# Patient Record
Sex: Male | Born: 1955 | Race: Black or African American | Hispanic: No | Marital: Married | Smoking: Former smoker
Health system: Southern US, Community
[De-identification: ages and names within clinical notes are randomized; demographics above are authoritative.]

## PROBLEM LIST (undated history)

## (undated) DIAGNOSIS — E7800 Pure hypercholesterolemia, unspecified: Secondary | ICD-10-CM

## (undated) DIAGNOSIS — F419 Anxiety disorder, unspecified: Secondary | ICD-10-CM

## (undated) DIAGNOSIS — I2510 Atherosclerotic heart disease of native coronary artery without angina pectoris: Secondary | ICD-10-CM

## (undated) DIAGNOSIS — K649 Unspecified hemorrhoids: Secondary | ICD-10-CM

## (undated) DIAGNOSIS — R0600 Dyspnea, unspecified: Secondary | ICD-10-CM

## (undated) DIAGNOSIS — F32A Depression, unspecified: Secondary | ICD-10-CM

## (undated) DIAGNOSIS — N529 Male erectile dysfunction, unspecified: Secondary | ICD-10-CM

## (undated) DIAGNOSIS — J45909 Unspecified asthma, uncomplicated: Secondary | ICD-10-CM

## (undated) DIAGNOSIS — N401 Enlarged prostate with lower urinary tract symptoms: Secondary | ICD-10-CM

## (undated) DIAGNOSIS — F329 Major depressive disorder, single episode, unspecified: Secondary | ICD-10-CM

## (undated) DIAGNOSIS — R3915 Urgency of urination: Secondary | ICD-10-CM

## (undated) DIAGNOSIS — J449 Chronic obstructive pulmonary disease, unspecified: Secondary | ICD-10-CM

## (undated) DIAGNOSIS — I700 Atherosclerosis of aorta: Secondary | ICD-10-CM

## (undated) DIAGNOSIS — K219 Gastro-esophageal reflux disease without esophagitis: Secondary | ICD-10-CM

## (undated) DIAGNOSIS — G4730 Sleep apnea, unspecified: Secondary | ICD-10-CM

## (undated) DIAGNOSIS — R972 Elevated prostate specific antigen [PSA]: Secondary | ICD-10-CM

## (undated) DIAGNOSIS — R351 Nocturia: Secondary | ICD-10-CM

## (undated) DIAGNOSIS — I10 Essential (primary) hypertension: Secondary | ICD-10-CM

## (undated) HISTORY — DX: Anxiety disorder, unspecified: F41.9

## (undated) HISTORY — DX: Essential (primary) hypertension: I10

## (undated) HISTORY — DX: Gastro-esophageal reflux disease without esophagitis: K21.9

## (undated) HISTORY — DX: Unspecified hemorrhoids: K64.9

## (undated) HISTORY — DX: Depression, unspecified: F32.A

## (undated) HISTORY — DX: Sleep apnea, unspecified: G47.30

## (undated) HISTORY — DX: Chronic obstructive pulmonary disease, unspecified: J44.9

## (undated) HISTORY — DX: Major depressive disorder, single episode, unspecified: F32.9

## (undated) HISTORY — DX: Atherosclerosis of aorta: I70.0

## (undated) HISTORY — DX: Atherosclerotic heart disease of native coronary artery without angina pectoris: I25.10

## (undated) HISTORY — PX: PROSTATE BIOPSY: SHX241

---

## 2005-11-22 ENCOUNTER — Emergency Department (HOSPITAL_COMMUNITY): Admission: EM | Admit: 2005-11-22 | Discharge: 2005-11-22 | Payer: Self-pay | Admitting: Emergency Medicine

## 2005-12-01 ENCOUNTER — Encounter: Admission: RE | Admit: 2005-12-01 | Discharge: 2005-12-01 | Payer: Self-pay | Admitting: Gastroenterology

## 2006-03-11 ENCOUNTER — Emergency Department (HOSPITAL_COMMUNITY): Admission: EM | Admit: 2006-03-11 | Discharge: 2006-03-11 | Payer: Self-pay | Admitting: Emergency Medicine

## 2007-11-03 ENCOUNTER — Emergency Department (HOSPITAL_COMMUNITY): Admission: EM | Admit: 2007-11-03 | Discharge: 2007-11-03 | Payer: Self-pay | Admitting: Emergency Medicine

## 2008-10-11 ENCOUNTER — Emergency Department (HOSPITAL_COMMUNITY): Admission: EM | Admit: 2008-10-11 | Discharge: 2008-10-11 | Payer: Self-pay | Admitting: Emergency Medicine

## 2008-12-05 ENCOUNTER — Emergency Department (HOSPITAL_COMMUNITY): Admission: EM | Admit: 2008-12-05 | Discharge: 2008-12-05 | Payer: Self-pay | Admitting: Emergency Medicine

## 2009-03-04 ENCOUNTER — Emergency Department (HOSPITAL_COMMUNITY): Admission: EM | Admit: 2009-03-04 | Discharge: 2009-03-04 | Payer: Self-pay | Admitting: Emergency Medicine

## 2009-03-06 ENCOUNTER — Emergency Department (HOSPITAL_COMMUNITY): Admission: EM | Admit: 2009-03-06 | Discharge: 2009-03-06 | Payer: Self-pay | Admitting: Emergency Medicine

## 2009-06-10 ENCOUNTER — Emergency Department (HOSPITAL_COMMUNITY): Admission: EM | Admit: 2009-06-10 | Discharge: 2009-06-10 | Payer: Self-pay | Admitting: Emergency Medicine

## 2009-06-20 ENCOUNTER — Ambulatory Visit: Payer: Self-pay | Admitting: Internal Medicine

## 2009-06-20 ENCOUNTER — Encounter (INDEPENDENT_AMBULATORY_CARE_PROVIDER_SITE_OTHER): Payer: Self-pay | Admitting: Internal Medicine

## 2009-06-20 LAB — CONVERTED CEMR LAB
AST: 17 units/L (ref 0–37)
Albumin: 4.1 g/dL (ref 3.5–5.2)
BUN: 15 mg/dL (ref 6–23)
Basophils Absolute: 0 10*3/uL (ref 0.0–0.1)
Basophils Relative: 0 % (ref 0–1)
CO2: 23 meq/L (ref 19–32)
Calcium: 8.9 mg/dL (ref 8.4–10.5)
Chloride: 108 meq/L (ref 96–112)
Eosinophils Absolute: 0.1 10*3/uL (ref 0.0–0.7)
Glucose, Bld: 76 mg/dL (ref 70–99)
HCT: 42.1 % (ref 39.0–52.0)
MCV: 85.7 fL (ref 78.0–100.0)
Monocytes Absolute: 0.6 10*3/uL (ref 0.1–1.0)
Monocytes Relative: 11 % (ref 3–12)
Neutro Abs: 2.4 10*3/uL (ref 1.7–7.7)

## 2009-09-21 HISTORY — PX: COLONOSCOPY: SHX174

## 2010-12-29 LAB — WOUND CULTURE

## 2011-01-01 LAB — WOUND CULTURE

## 2011-06-12 LAB — URINALYSIS, ROUTINE W REFLEX MICROSCOPIC
Bilirubin Urine: NEGATIVE
Hgb urine dipstick: NEGATIVE
Nitrite: NEGATIVE
Specific Gravity, Urine: 1.02

## 2011-06-12 LAB — COMPREHENSIVE METABOLIC PANEL
AST: 21
Alkaline Phosphatase: 71
BUN: 12
Calcium: 9.6
Creatinine, Ser: 1.06
GFR calc non Af Amer: >60
Potassium: 4
Sodium: 134 — ABNORMAL LOW

## 2011-06-12 LAB — CBC
HCT: 45.8
Hemoglobin: 16.2
Platelets: 239
RBC: 5.22
WBC: 6.2

## 2011-06-12 LAB — DIFFERENTIAL
Basophils Absolute: 0.1
Eosinophils Relative: 3
Lymphocytes Relative: 36
Monocytes Absolute: 0.9
Neutrophils Relative %: 46

## 2011-07-26 ENCOUNTER — Emergency Department (HOSPITAL_COMMUNITY)
Admission: EM | Admit: 2011-07-26 | Discharge: 2011-07-26 | Disposition: A | Discharge status: Home / Self Care | Payer: Self-pay | Attending: Emergency Medicine | Admitting: Emergency Medicine

## 2011-07-26 ENCOUNTER — Encounter: Payer: Self-pay | Admitting: Adult Health

## 2011-07-26 DIAGNOSIS — K029 Dental caries, unspecified: Secondary | ICD-10-CM | POA: Insufficient documentation

## 2011-07-26 DIAGNOSIS — K047 Periapical abscess without sinus: Secondary | ICD-10-CM | POA: Insufficient documentation

## 2011-07-26 DIAGNOSIS — K089 Disorder of teeth and supporting structures, unspecified: Secondary | ICD-10-CM | POA: Insufficient documentation

## 2011-07-26 DIAGNOSIS — R220 Localized swelling, mass and lump, head: Secondary | ICD-10-CM | POA: Insufficient documentation

## 2011-07-26 MED ORDER — HYDROCODONE-ACETAMINOPHEN 5-325 MG PO TABS: 1.0000 | ORAL_TABLET | Freq: Four times a day (QID) | ORAL | Status: AC | PRN

## 2011-07-26 MED ORDER — PENICILLIN V POTASSIUM 500 MG PO TABS: 500.0000 mg | ORAL_TABLET | Freq: Four times a day (QID) | ORAL | Status: AC

## 2011-07-26 NOTE — ED Notes (Signed)
R upper tooth pain began yesterday. Right side of face withgedema. Airway intact.

## 2011-07-26 NOTE — ED Provider Notes (Signed)
History     CSN: [id number] Arrival date & time: 07/26/2011 11:52 AM   First MD Initiated Contact with Patient 07/26/11 1206      No chief complaint on file.   (Consider location/radiation/quality/duration/timing/severity/associated sxs/prior treatment) HPI Patient states that he developed a toothache and swelling of his face over the last 3 days.  Patient states that he does not see a dentist regularly.  She denies difficulty swallowing or difficulty breathing or swelling of the tongue.  Patient also denies fevers, as well. No past medical history on file.  No past surgical history on file.  No family history on file.  History  Substance Use Topics  . Smoking status: Not on file  . Smokeless tobacco: Not on file  . Alcohol Use: Not on file      Review of Systems  All other systems reviewed and are negative.    Allergies  Review of patient's allergies indicates no known allergies.  Home Medications   Current Outpatient Rx  Name Route Sig Dispense Refill  . IBUPROFEN 200 MG PO TABS Oral Take 400 mg by mouth every 8 (eight) hours as needed. For pain.       There were no vitals taken for this visit.  Physical Exam  Constitutional: He appears well-developed and well-nourished.  HENT:  Head: Normocephalic and atraumatic. No trismus in the jaw.  Mouth/Throat: Uvula is midline and oropharynx is clear and moist. He does not have dentures. No oral lesions. Dental abscesses and dental caries present. No uvula swelling.    Eyes: Pupils are equal, round, and reactive to light.  Neck: Normal range of motion. Neck supple.  Cardiovascular: Normal rate and regular rhythm.   Pulmonary/Chest: Effort normal and breath sounds normal.  Lymphadenopathy:    He has no cervical adenopathy.    ED Course  Procedures (including critical care time)  Labs Reviewed - No data to display No results found.   No diagnosis found.    MDM   The patient will be treated for an early  dental abscess.  He will be referred to the on-call dentist.  He is advised to use heat topically.  He told to return here as needed.       Jamesetta Orleans Gilman, [place] 07/26/11 1240

## 2011-07-26 NOTE — ED Provider Notes (Signed)
Medical screening examination/treatment/procedure(s) were performed by non-physician practitioner and as supervising physician I was immediately available for consultation/collaboration.  Doug Sou, MD 07/26/11 [phone]

## 2011-07-29 ENCOUNTER — Encounter (HOSPITAL_COMMUNITY): Payer: Self-pay | Admitting: Adult Health

## 2012-09-18 ENCOUNTER — Emergency Department (HOSPITAL_COMMUNITY): Payer: Self-pay

## 2012-09-18 ENCOUNTER — Observation Stay (HOSPITAL_COMMUNITY)
Admission: EM | Admit: 2012-09-18 | Discharge: 2012-09-19 | Disposition: A | Discharge status: Home / Self Care | Payer: Self-pay | Attending: Internal Medicine | Admitting: Internal Medicine

## 2012-09-18 ENCOUNTER — Encounter (HOSPITAL_COMMUNITY): Payer: Self-pay | Admitting: *Deleted

## 2012-09-18 DIAGNOSIS — F17200 Nicotine dependence, unspecified, uncomplicated: Secondary | ICD-10-CM | POA: Insufficient documentation

## 2012-09-18 DIAGNOSIS — K219 Gastro-esophageal reflux disease without esophagitis: Secondary | ICD-10-CM | POA: Insufficient documentation

## 2012-09-18 DIAGNOSIS — M25519 Pain in unspecified shoulder: Secondary | ICD-10-CM | POA: Insufficient documentation

## 2012-09-18 DIAGNOSIS — R0602 Shortness of breath: Secondary | ICD-10-CM | POA: Insufficient documentation

## 2012-09-18 DIAGNOSIS — Z79899 Other long term (current) drug therapy: Secondary | ICD-10-CM | POA: Insufficient documentation

## 2012-09-18 DIAGNOSIS — Z7982 Long term (current) use of aspirin: Secondary | ICD-10-CM | POA: Insufficient documentation

## 2012-09-18 DIAGNOSIS — M25511 Pain in right shoulder: Secondary | ICD-10-CM

## 2012-09-18 DIAGNOSIS — R079 Chest pain, unspecified: Principal | ICD-10-CM | POA: Diagnosis present

## 2012-09-18 HISTORY — DX: Unspecified asthma, uncomplicated: J45.909

## 2012-09-18 LAB — CBC
HCT: 42.2 % (ref 39.0–52.0)
Hemoglobin: 15.7 g/dL (ref 13.0–17.0)
MCV: 81.9 fL (ref 78.0–100.0)
RBC: 5.15 MIL/uL (ref 4.22–5.81)
RDW: 14 % (ref 11.5–15.5)
WBC: 5.8 10*3/uL (ref 4.0–10.5)

## 2012-09-18 LAB — RAPID URINE DRUG SCREEN, HOSP PERFORMED
Amphetamines: NOT DETECTED
Barbiturates: NOT DETECTED
Tetrahydrocannabinol: NOT DETECTED

## 2012-09-18 LAB — BASIC METABOLIC PANEL
BUN: 13 mg/dL (ref 6–23)
CO2: 24 mEq/L (ref 19–32)
Calcium: 8.9 mg/dL (ref 8.4–10.5)
Chloride: 102 mEq/L (ref 96–112)
GFR calc non Af Amer: >90 mL/min (ref >90)
Potassium: 3.7 mEq/L (ref 3.5–5.1)
Sodium: 136 mEq/L (ref 135–145)

## 2012-09-18 LAB — LIPID PANEL
Cholesterol: 154 mg/dL (ref 0–200)
HDL: 40 mg/dL (ref >39)
LDL Cholesterol: 99 mg/dL (ref 0–99)
Triglycerides: 77 mg/dL (ref <150)

## 2012-09-18 LAB — POCT I-STAT TROPONIN I

## 2012-09-18 LAB — TROPONIN I: Troponin I: <0.3 ng/mL (ref <0.30)

## 2012-09-18 LAB — PHOSPHORUS: Phosphorus: 2.6 mg/dL (ref 2.3–4.6)

## 2012-09-18 LAB — TSH: TSH: 1.208 u[IU]/mL (ref 0.350–4.500)

## 2012-09-18 LAB — MAGNESIUM: Magnesium: 2 mg/dL (ref 1.5–2.5)

## 2012-09-18 MED ORDER — ONDANSETRON HCL 4 MG/2ML IJ SOLN: 4.0000 mg | Freq: Four times a day (QID) | INTRAMUSCULAR | Status: DC | PRN

## 2012-09-18 MED ORDER — ONDANSETRON HCL 4 MG PO TABS: 4.0000 mg | ORAL_TABLET | Freq: Four times a day (QID) | ORAL | Status: DC | PRN

## 2012-09-18 MED ORDER — MORPHINE SULFATE 2 MG/ML IJ SOLN
2.0000 mg | INTRAMUSCULAR | Status: DC | PRN
  Administered 2012-09-18 (×2): 2 mg via INTRAVENOUS
  Filled 2012-09-18 (×2): qty 1

## 2012-09-18 MED ORDER — HYDROCODONE-ACETAMINOPHEN 5-325 MG PO TABS
1.0000 | ORAL_TABLET | ORAL | Status: DC | PRN
  Administered 2012-09-18 – 2012-09-19 (×2): 2 via ORAL
  Administered 2012-09-19: 1 via ORAL
  Filled 2012-09-18 (×4): qty 2

## 2012-09-18 MED ORDER — ENOXAPARIN SODIUM 40 MG/0.4ML ~~LOC~~ SOLN
40.0000 mg | SUBCUTANEOUS | Status: DC
  Administered 2012-09-18: 40 mg via SUBCUTANEOUS
  Filled 2012-09-18 (×3): qty 0.4

## 2012-09-18 MED ORDER — ASPIRIN EC 81 MG PO TBEC
81.0000 mg | DELAYED_RELEASE_TABLET | Freq: Every day | ORAL | Status: DC
  Administered 2012-09-19: 81 mg via ORAL
  Filled 2012-09-18 (×2): qty 1

## 2012-09-18 MED ORDER — NITROGLYCERIN 0.4 MG SL SUBL
0.4000 mg | SUBLINGUAL_TABLET | SUBLINGUAL | Status: DC | PRN
  Administered 2012-09-18: 0.4 mg via SUBLINGUAL
  Filled 2012-09-18: qty 25

## 2012-09-18 MED ORDER — ASPIRIN 325 MG PO TABS
325.0000 mg | ORAL_TABLET | ORAL | Status: AC
  Administered 2012-09-18: 325 mg via ORAL
  Filled 2012-09-18: qty 1

## 2012-09-18 MED ORDER — MORPHINE SULFATE 4 MG/ML IJ SOLN
4.0000 mg | Freq: Once | INTRAMUSCULAR | Status: AC
  Administered 2012-09-18: 4 mg via INTRAVENOUS
  Filled 2012-09-18: qty 1

## 2012-09-18 MED ORDER — ZOLPIDEM TARTRATE 5 MG PO TABS
5.0000 mg | ORAL_TABLET | Freq: Every evening | ORAL | Status: DC | PRN
  Administered 2012-09-18: 5 mg via ORAL
  Filled 2012-09-18: qty 1

## 2012-09-18 MED ORDER — NICOTINE 14 MG/24HR TD PT24
14.0000 mg | MEDICATED_PATCH | Freq: Every day | TRANSDERMAL | Status: DC
  Administered 2012-09-18 – 2012-09-19 (×2): 14 mg via TRANSDERMAL
  Filled 2012-09-18 (×3): qty 1

## 2012-09-18 MED ORDER — SODIUM CHLORIDE 0.9 % IJ SOLN
3.0000 mL | Freq: Two times a day (BID) | INTRAMUSCULAR | Status: DC
  Administered 2012-09-18: 3 mL via INTRAVENOUS

## 2012-09-18 NOTE — ED Provider Notes (Signed)
History     CSN: [id number]  Arrival date & time 09/18/12  4098   First MD Initiated Contact with Patient 09/18/12 [id number]      Chief Complaint  Patient presents with  . Chest Pain    (Consider location/radiation/quality/duration/timing/severity/associated sxs/prior treatment) HPI Pt reports 3 weeks of intermittent chest pains, starts on R chest and radiates to L chest, associated with R arm tingling and 'heavy breathing'. He had a particular severe episode this morning, pain is described as pressure. No particular provoking or relieving factors. No prior evaluated of same. No known CAD, HTN or DM. He is a smoker and has a strong family history of CAD.   Past Medical History  Diagnosis Date  . Asthma     History reviewed. No pertinent past surgical history.  History reviewed. No pertinent family history.  History  Substance Use Topics  . Smoking status: Current Every Day Smoker -- 1.0 packs/day for 15 years    Types: Cigarettes  . Smokeless tobacco: Not on file  . Alcohol Use: No      Review of Systems All other systems reviewed and are negative except as noted in HPI.   Allergies  Review of patient's allergies indicates no known allergies.  Home Medications   Current Outpatient Rx  Name  Route  Sig  Dispense  Refill  . IBUPROFEN 200 MG PO TABS   Oral   Take 400 mg by mouth every 8 (eight) hours as needed. For pain.            BP 138/78  Pulse 83  Temp 98.5 F (36.9 C) (Oral)  Resp 16  SpO2 99%  Physical Exam  Nursing note and vitals reviewed. Constitutional: He is oriented to person, place, and time. He appears well-developed and well-nourished.  HENT:  Head: Normocephalic and atraumatic.  Eyes: EOM are normal. Pupils are equal, round, and reactive to light.  Neck: Normal range of motion. Neck supple.  Cardiovascular: Normal rate, normal heart sounds and intact distal pulses.   Pulmonary/Chest: Effort normal and breath sounds normal. He exhibits  tenderness.  Abdominal: Bowel sounds are normal. He exhibits no distension. There is no tenderness.  Musculoskeletal: Normal range of motion. He exhibits no edema and no tenderness.  Neurological: He is alert and oriented to person, place, and time. He has normal strength. No cranial nerve deficit or sensory deficit.  Skin: Skin is warm and dry. No rash noted.  Psychiatric: He has a normal mood and affect.    ED Course  Procedures (including critical care time)  Labs Reviewed  CBC - Abnormal; Notable for the following:    MCHC 37.2 (*)  RULED OUT INTERFERING SUBSTANCES   All other components within normal limits  BASIC METABOLIC PANEL  POCT I-STAT TROPONIN I   Dg Chest Port 1 View  09/18/2012  *RADIOLOGY REPORT*  Clinical Data: Chest pain  PORTABLE CHEST - 1 VIEW  Comparison: None  Findings: Normal heart size.  There is no pleural effusion or edema.  No airspace consolidation identified.  Review of the visualized osseous structures is unremarkable.  IMPRESSION:  1.  No acute cardiopulmonary abnormalities.   Original Report Authenticated By: Signa Kell, M.D.      No diagnosis found.    MDM   Date: 09/18/2012  Rate: 83  Rhythm: normal sinus rhythm  QRS Axis: left  Intervals: normal  ST/T Wave abnormalities: normal  Conduction Disutrbances:none  Narrative Interpretation:   Old EKG Reviewed: none available  10:29 AM Pt still having some pain, Morphine ordered. Discussed with Hospitalist who will admit for rule out.         Charles B. Bernette Mayers, MD 09/18/12 1029

## 2012-09-18 NOTE — ED Notes (Signed)
Attempted to call report to [facility], however RN said he is not comfortable getting this pt since they are not cardiac telemetry. Tim RN stated he will check if this pt can be transferred to cardiac telemetry floor. Awaiting for call back from admitting MD.

## 2012-09-18 NOTE — H&P (Signed)
[facility] History and Physical  Joshua Stephenson WUJ:[id number] DOB: [birth year] DOA: 09/18/2012  Referring physician: ED physician PCP: No primary provider on file.   Chief Complaint: Chest pain  HPI:  Pt is 56 yo male with no specific past medical history, presents to [facility] ED with main concern of sudden onset, sharp and pressure like right sidedchest pain, started one day prior to admission and associated with shortness of breath. Pt describes pain as intermittent in nature, occasionally but not consistently radiating to the left side, no specific alleviating or aggravating factors. No similar events in the past. Pt denies fevers, chills, abdominal or urinary concerns, no specific focal neurological weakness, no headaches, no visual changes. Pt also denies any cough, palpitations, no orthopnea.  Assessment and Plan:  Principal Problem:  *Chest pain - unclear etiology at this time - will admit to telemetry unit for observation and ACS rule out - provide supportive care with analgesia and oxygen as needed - continue NTG as needed as well - obtain 12 lead EKG, cycle CE's, check TSH, UDS, lipid panel  Code Status: Full Family Communication: Pt at bedside Disposition Plan: Admit to telemetry floor for observation  Review of Systems:  Constitutional: Negative for fever, chills and malaise/fatigue. Negative for diaphoresis.  HENT: Negative for hearing loss, ear pain, nosebleeds, congestion, sore throat, neck pain, tinnitus and ear discharge.   Eyes: Negative for blurred vision, double vision, photophobia, pain, discharge and redness.  Respiratory: Negative for cough, hemoptysis, sputum production, positive for shortness of breath, negative for wheezing and stridor.   Cardiovascular: Positive for chest pain, negative for palpitations, orthopnea, claudication and leg swelling.  Gastrointestinal: Negative for nausea, vomiting and abdominal pain. Negative for heartburn, constipation,  blood in stool and melena.  Genitourinary: Negative for dysuria, urgency, frequency, hematuria and flank pain.  Musculoskeletal: Negative for myalgias, back pain, joint pain and falls.  Skin: Negative for itching and rash.  Neurological: Negative for dizziness and weakness. Negative for tingling, tremors, sensory change, speech change, focal weakness, loss of consciousness and headaches.  Endo/Heme/Allergies: Negative for environmental allergies and polydipsia. Does not bruise/bleed easily.  Psychiatric/Behavioral: Negative for suicidal ideas. The patient is not nervous/anxious.      Past Medical History  Diagnosis Date  . Asthma     History reviewed. No pertinent past surgical history.  Social History:  reports that he has been smoking Cigarettes.  He has a 15 pack-year smoking history. He does not have any smokeless tobacco history on file. He reports that he does not drink alcohol or use illicit drugs.  No Known Allergies  No family history of heart disease.   Prior to Admission medications   Not on File    Physical Exam: Filed Vitals:   09/18/12 0832 09/18/12 0839 09/18/12 0958  BP: 138/78  118/72  Pulse:  83 71  Temp: 98.5 F (36.9 C)    TempSrc: Oral    Resp: 22 16 13   SpO2: 99% 99% 99%    Physical Exam  Constitutional: Appears well-developed and well-nourished. No distress.  HENT: Normocephalic. External right and left ear normal. Oropharynx is clear and moist.  Eyes: Conjunctivae and EOM are normal. PERRLA, no scleral icterus.  Neck: Normal ROM. Neck supple. No JVD. No tracheal deviation. No thyromegaly.  CVS: RRR, S1/S2 +, no murmurs, no gallops, no carotid bruit.  Pulmonary: Effort and breath sounds normal, no stridor, rhonchi, wheezes, rales.  Abdominal: Soft. BS +,  no distension, tenderness, rebound or guarding.  Musculoskeletal: Normal  range of motion. No edema and no tenderness.  Lymphadenopathy: No lymphadenopathy noted, cervical, inguinal. Neuro:  Alert. Normal reflexes, muscle tone coordination. No cranial nerve deficit. Skin: Skin is warm and dry. No rash noted. Not diaphoretic. No erythema. No pallor.  Psychiatric: Normal mood and affect. Behavior, judgment, thought content normal.   Labs on Admission:  Basic Metabolic Panel:  Lab 09/18/12 1610  NA 136  K 3.7  CL 102  CO2 24  GLUCOSE 99  BUN 13  CREATININE 0.99  CALCIUM 8.9  MG --  PHOS --   CBC:  Lab 09/18/12 0857  WBC 5.8  NEUTROABS --  HGB 15.7  HCT 42.2  MCV 81.9  PLT 219   Radiological Exams on Admission: Dg Chest Port 1 View  09/18/2012  *RADIOLOGY REPORT*  Clinical Data: Chest pain  PORTABLE CHEST - 1 VIEW  Comparison: None  Findings: Normal heart size.  There is no pleural effusion or edema.  No airspace consolidation identified.  Review of the visualized osseous structures is unremarkable.  IMPRESSION:  1.  No acute cardiopulmonary abnormalities.   Original Report Authenticated By: Signa Kell, M.D.    EKG: Normal sinus rhythm, no ST/T wave changes  Debbora Presto, MD  [facility] Pager [phone]  If 7PM-7AM, please contact night-coverage [email or web address] Password [facility] 09/18/2012, 10:26 AM

## 2012-09-18 NOTE — ED Notes (Signed)
Attempted to call report, nurse is unavailable at this time

## 2012-09-18 NOTE — ED Notes (Signed)
Pt alert and oriented x4. Respirations even and unlabored, bilateral symmetrical rise and fall of chest. Skin warm and dry. In no acute distress. Denies needs.   

## 2012-09-18 NOTE — Progress Notes (Signed)
RN reviewed PCR screen which is resulted as Negative. Will D/C Contact Precautions. Pt and wife educated about Negative results. No questions voiced.

## 2012-09-18 NOTE — ED Notes (Signed)
Pt states he has been having rt sided chest pain on and off for about 3 weeks, rt arm numbness and tingling, pain shoots across chest and leaves, this morning it lasted about 10 min. No other associated symptoms

## 2012-09-18 NOTE — ED Notes (Signed)
Called to pt's room by wife, pt sts chest pain is back and much stronger after he got up to walk to the bathroom. Nitroglycerin given as ordered, will continue to monitor

## 2012-09-19 DIAGNOSIS — M25519 Pain in unspecified shoulder: Secondary | ICD-10-CM

## 2012-09-19 DIAGNOSIS — K219 Gastro-esophageal reflux disease without esophagitis: Secondary | ICD-10-CM

## 2012-09-19 LAB — CBC
HCT: 40.4 % (ref 39.0–52.0)
Hemoglobin: 14.8 g/dL (ref 13.0–17.0)
MCHC: 36.6 g/dL — ABNORMAL HIGH (ref 30.0–36.0)
MCV: 82.8 fL (ref 78.0–100.0)

## 2012-09-19 LAB — BASIC METABOLIC PANEL
BUN: 16 mg/dL (ref 6–23)
CO2: 27 mEq/L (ref 19–32)
Chloride: 103 mEq/L (ref 96–112)
GFR calc Af Amer: 83 mL/min — ABNORMAL LOW (ref >90)
Glucose, Bld: 95 mg/dL (ref 70–99)
Potassium: 3.9 mEq/L (ref 3.5–5.1)

## 2012-09-19 MED ORDER — GABAPENTIN 100 MG PO CAPS: 100.0000 mg | ORAL_CAPSULE | Freq: Three times a day (TID) | ORAL | Status: DC

## 2012-09-19 MED ORDER — ASPIRIN 81 MG PO TBEC: 81.0000 mg | DELAYED_RELEASE_TABLET | Freq: Every day | ORAL | Status: DC

## 2012-09-19 MED ORDER — HYDROCODONE-ACETAMINOPHEN 5-325 MG PO TABS: 1.0000 | ORAL_TABLET | ORAL | Status: DC | PRN

## 2012-09-19 MED ORDER — OMEPRAZOLE 40 MG PO CPDR: 40.0000 mg | DELAYED_RELEASE_CAPSULE | Freq: Every day | ORAL | Status: DC

## 2012-09-19 MED ORDER — NICOTINE 14 MG/24HR TD PT24: 1.0000 | MEDICATED_PATCH | Freq: Every day | TRANSDERMAL | Status: DC

## 2012-09-19 NOTE — Discharge Summary (Signed)
Physician Discharge Summary  Joshua Stephenson ZOX:[id number] DOB: Jul 09, 1956 DOA: 09/18/2012  PCP: No primary provider on file.  Admit date: 09/18/2012 Discharge date: 09/19/2012  Time spent: >30 minutes  Recommendations for Outpatient Follow-up:  1-Take medication as prescribed 2-Arrange follow up with orthopedic service for further evaluation and treatment of right shoulder discomfort 3-[facility] will arrange outpatient visit for stress test (Given, Gender, age, family hx and smoking status) 4-Stop smoking.  Discharge Diagnoses:  *Chest pain GERD Right shoulder pain  Discharge Condition: stable and improved. Patient will follow with [facility] cardiology for outpatient stress test; he will also arrange as his earliest convenience follow up with an orthopedic doctor for evaluation on his right shoulder problem (appears to be rotator cuff syndrome). Advised to quit smoking and to take medications as prescribed.  Diet recommendation: heart healthy diet  Filed Weights   09/18/12 1355  Weight: 81.693 kg (180 lb 1.6 oz)    History of present illness:  56 yo male with no specific past medical history, presents to [facility] ED with main concern of sudden onset, sharp and pressure like right sidedchest pain, started one day prior to admission and associated with shortness of breath. Pt describes pain as intermittent in nature, occasionally but not consistently radiating to the left side, no specific alleviating or aggravating factors. No similar events in the past. Pt denies fevers, chills, abdominal or urinary concerns, no specific focal neurological weakness, no headaches, no visual changes. Pt also denies any cough, palpitations, no orthopnea.   Hospital Course:  1-Chest pain: atypical and most likely secondary to GERD. CE's negative and no further discomfort throughout the night. Patient with strong family hx of CAD and smoking status. Will have stress test as an outpatient to be  thorough. Lipid profile WNL. Continue baby ASA daily. Will start PPI and provide prescription for PRN pain meds.  2-GERD: advised to stop smoking. Started on prilosec 40mg  daily.  3-Right shoulder pain: PE consistently with rotator cuff syndrome; patient advised to arrange follow up with an orthopedic specialist for further evaluation and treatment. Started on neurontin to help controlling symptoms. Will also received Pain meds to use as needed.  4-Tobacco abuse: counseling provided. started on nicotine patch.  Procedures: See below for x-ray reports.  Consultations:  none  Discharge Exam: Filed Vitals:   09/18/12 1326 09/18/12 1355 09/18/12 2130 09/19/12 0545  BP: 131/73 128/87 121/80 122/67  Pulse: 63 64 62 66  Temp: 98.3 F (36.8 C) 98.2 F (36.8 C) 98.1 F (36.7 C) 98.1 F (36.7 C)  TempSrc:  Oral Oral Oral  Resp: 18 20 20 20   Height:  5\' 7"  (1.702 m)    Weight:  81.693 kg (180 lb 1.6 oz)    SpO2: 100% 100% 98% 98%    General: NAD, no SOB, afebrile Cardiovascular: RRR, no rubs or gallops Respiratory: CTA bilaterally Extremities: no edema, no cyanosis; right upper extremity with decrease range of motion and pain with shoulder abduction and rotation. Patient also reported some intermittent numbness and tingling sensation on his right arm. Neuro: non focal.  Discharge Instructions  Discharge Orders    Future Orders Please Complete By Expires   Diet - low sodium heart healthy      Discharge instructions      Comments:   -Stop smoking -Take medications as prescribed -Arrange follow up with orthopedic service for further evaluation and treatment of your right shoulder pain. -[facility] will call you to arrange outpatient visit for stress test.  Medication List     As of 09/19/2012 11:39 AM    TAKE these medications         aspirin 81 MG EC tablet   Take 1 tablet (81 mg total) by mouth daily.      gabapentin 100 MG capsule   Commonly known as:  NEURONTIN   Take 1 capsule (100 mg total) by mouth 3 (three) times daily.      HYDROcodone-acetaminophen 5-325 MG per tablet   Commonly known as: NORCO/VICODIN   Take 1-2 tablets by mouth every 4 (four) hours as needed.      nicotine 14 mg/24hr patch   Commonly known as: NICODERM CQ - dosed in mg/24 hours   Place 1 patch onto the skin daily.      omeprazole 40 MG capsule   Commonly known as: PRILOSEC   Take 1 capsule (40 mg total) by mouth daily.           Follow-up Information    Follow up with [facility]). (Office will call you with appointment details.)    Contact information:   [address], [address] [place] [address] [phone]          The results of significant diagnostics from this hospitalization (including imaging, microbiology, ancillary and laboratory) are listed below for reference.    Significant Diagnostic Studies: Dg Chest Port 1 View  09/18/2012  *RADIOLOGY REPORT*  Clinical Data: Chest pain  PORTABLE CHEST - 1 VIEW  Comparison: None  Findings: Normal heart size.  There is no pleural effusion or edema.  No airspace consolidation identified.  Review of the visualized osseous structures is unremarkable.  IMPRESSION:  1.  No acute cardiopulmonary abnormalities.   Original Report Authenticated By: Signa Kell, M.D.     Microbiology: Recent Results (from the past 240 hour(s))  MRSA PCR SCREENING     Status: Normal   Collection Time   09/18/12  4:03 PM      Component Value Range Status Comment   MRSA by PCR NEGATIVE  NEGATIVE Final      Labs: Basic Metabolic Panel:  Lab 09/19/12 9147 09/18/12 1418 09/18/12 0857  NA 138 -- 136  K 3.9 -- 3.7  CL 103 -- 102  CO2 27 -- 24  GLUCOSE 95 -- 99  BUN 16 -- 13  CREATININE 1.13 -- 0.99  CALCIUM 8.7 -- 8.9  MG -- 2.0 --  PHOS -- 2.6 --   CBC:  Lab 09/19/12 0130 09/18/12 0857  WBC 6.1 5.8  NEUTROABS -- --  HGB 14.8 15.7  HCT 40.4 42.2  MCV 82.8  81.9  PLT 234 219   Cardiac Enzymes:  Lab 09/19/12 0130 09/18/12 1948 09/18/12 1418  CKTOTAL -- -- --  CKMB -- -- --  CKMBINDEX -- -- --  TROPONINI <0.30 <0.30 <0.30    Signed:  Cebastian Neis  [facility] 09/19/2012, 11:39 AM

## 2012-09-19 NOTE — Care Management Note (Signed)
    Page 1 of 1   09/19/2012     11:53:30 AM   CARE MANAGEMENT NOTE 09/19/2012  Patient:  Joshua Stephenson, Joshua Stephenson   Account Number:  [id number]  Date Initiated:  09/19/2012  Documentation initiated by:  Lanier Clam  Subjective/Objective Assessment:   ADMITTED W/CHEST PAIN.     Action/Plan:   FROM HOME   Anticipated DC Date:  09/19/2012   Anticipated DC Plan:  HOME/SELF CARE      DC Planning Services  CM consult      Choice offered to / List presented to:             Status of service:  Completed, signed off Medicare Important Message given?   (If response is "NO", the following Medicare IM given date fields will be blank) Date Medicare IM given:   Date Additional Medicare IM given:    Discharge Disposition:  HOME/SELF CARE  Per UR Regulation:  Reviewed for med. necessity/level of care/duration of stay  If discussed at Long Length of Stay Meetings, dates discussed:    Comments:  09/19/12 Christean Silvestri RN,BSN NCM [phone] NO D/C NEEDS.

## 2014-05-27 ENCOUNTER — Emergency Department (HOSPITAL_COMMUNITY): Payer: PRIVATE HEALTH INSURANCE

## 2014-05-27 ENCOUNTER — Emergency Department (HOSPITAL_COMMUNITY)
Admission: EM | Admit: 2014-05-27 | Discharge: 2014-05-27 | Disposition: A | Discharge status: Home / Self Care | Payer: PRIVATE HEALTH INSURANCE | Attending: Emergency Medicine | Admitting: Emergency Medicine

## 2014-05-27 ENCOUNTER — Encounter (HOSPITAL_COMMUNITY): Payer: Self-pay | Admitting: Emergency Medicine

## 2014-05-27 ENCOUNTER — Emergency Department (HOSPITAL_COMMUNITY)
Admission: EM | Admit: 2014-05-27 | Discharge: 2014-05-27 | Discharge status: Against Medical Advice | Payer: PRIVATE HEALTH INSURANCE | Attending: Emergency Medicine | Admitting: Emergency Medicine

## 2014-05-27 DIAGNOSIS — R072 Precordial pain: Secondary | ICD-10-CM | POA: Diagnosis present

## 2014-05-27 DIAGNOSIS — R209 Unspecified disturbances of skin sensation: Secondary | ICD-10-CM | POA: Insufficient documentation

## 2014-05-27 DIAGNOSIS — J45901 Unspecified asthma with (acute) exacerbation: Secondary | ICD-10-CM | POA: Insufficient documentation

## 2014-05-27 DIAGNOSIS — F17200 Nicotine dependence, unspecified, uncomplicated: Secondary | ICD-10-CM | POA: Diagnosis not present

## 2014-05-27 DIAGNOSIS — R079 Chest pain, unspecified: Secondary | ICD-10-CM | POA: Insufficient documentation

## 2014-05-27 DIAGNOSIS — R0789 Other chest pain: Secondary | ICD-10-CM | POA: Diagnosis not present

## 2014-05-27 DIAGNOSIS — F411 Generalized anxiety disorder: Secondary | ICD-10-CM | POA: Diagnosis not present

## 2014-05-27 DIAGNOSIS — F419 Anxiety disorder, unspecified: Secondary | ICD-10-CM

## 2014-05-27 DIAGNOSIS — K649 Unspecified hemorrhoids: Secondary | ICD-10-CM | POA: Diagnosis not present

## 2014-05-27 DIAGNOSIS — J45909 Unspecified asthma, uncomplicated: Secondary | ICD-10-CM | POA: Insufficient documentation

## 2014-05-27 LAB — CBC
HEMATOCRIT: 40.3 % (ref 39.0–52.0)
HEMATOCRIT: 41 % (ref 39.0–52.0)
HEMOGLOBIN: 15.2 g/dL (ref 13.0–17.0)
Hemoglobin: 15.3 g/dL (ref 13.0–17.0)
MCH: 31.8 pg (ref 26.0–34.0)
MCH: 31.7 pg (ref 26.0–34.0)
MCHC: 37.7 g/dL — AB (ref 30.0–36.0)
MCHC: 37.3 g/dL — ABNORMAL HIGH (ref 30.0–36.0)
MCV: 84.3 fL (ref 78.0–100.0)
MCV: 85.1 fL (ref 78.0–100.0)
Platelets: 250 10*3/uL (ref 150–400)
Platelets: 243 10*3/uL (ref 150–400)
RBC: 4.78 MIL/uL (ref 4.22–5.81)
RBC: 4.82 MIL/uL (ref 4.22–5.81)
RDW: 14.4 % (ref 11.5–15.5)
RDW: 14.7 % (ref 11.5–15.5)
WBC: 8.3 10*3/uL (ref 4.0–10.5)
WBC: 6.5 10*3/uL (ref 4.0–10.5)

## 2014-05-27 LAB — BASIC METABOLIC PANEL
Anion gap: 16 — ABNORMAL HIGH (ref 5–15)
Anion gap: 16 — ABNORMAL HIGH (ref 5–15)
BUN: 11 mg/dL (ref 6–23)
BUN: 11 mg/dL (ref 6–23)
CHLORIDE: 107 meq/L (ref 96–112)
CO2: 21 mEq/L (ref 19–32)
CO2: 22 meq/L (ref 19–32)
CREATININE: 1.01 mg/dL (ref 0.50–1.35)
CREATININE: 1.03 mg/dL (ref 0.50–1.35)
Calcium: 9.3 mg/dL (ref 8.4–10.5)
Calcium: 9.1 mg/dL (ref 8.4–10.5)
Chloride: 108 mEq/L (ref 96–112)
GFR calc Af Amer: >90 mL/min (ref >90)
GFR calc non Af Amer: 80 mL/min — ABNORMAL LOW (ref >90)
GFR, EST NON AFRICAN AMERICAN: 82 mL/min — AB (ref >90)
GLUCOSE: 103 mg/dL — AB (ref 70–99)
Glucose, Bld: 132 mg/dL — ABNORMAL HIGH (ref 70–99)
POTASSIUM: 3.7 meq/L (ref 3.7–5.3)
Potassium: 3.7 mEq/L (ref 3.7–5.3)
Sodium: 145 mEq/L (ref 137–147)
Sodium: 145 mEq/L (ref 137–147)

## 2014-05-27 LAB — I-STAT TROPONIN, ED: Troponin i, poc: 0 ng/mL (ref 0.00–0.08)

## 2014-05-27 LAB — TROPONIN I: Troponin I: <0.3 ng/mL (ref <0.30)

## 2014-05-27 LAB — POC OCCULT BLOOD, ED: FECAL OCCULT BLD: NEGATIVE

## 2014-05-27 LAB — PRO B NATRIURETIC PEPTIDE: Pro B Natriuretic peptide (BNP): 22.2 pg/mL (ref 0–125)

## 2014-05-27 MED ORDER — HYDROCORTISONE 2.5 % RE CREA: TOPICAL_CREAM | RECTAL | Status: DC

## 2014-05-27 MED ORDER — OXYCODONE-ACETAMINOPHEN 5-325 MG PO TABS
1.0000 | ORAL_TABLET | Freq: Once | ORAL | Status: AC
  Administered 2014-05-27: 1 via ORAL
  Filled 2014-05-27: qty 1

## 2014-05-27 MED ORDER — BISACODYL 5 MG PO TBEC: 5.0000 mg | DELAYED_RELEASE_TABLET | Freq: Two times a day (BID) | ORAL | Status: DC

## 2014-05-27 MED ORDER — OXYCODONE-ACETAMINOPHEN 5-325 MG PO TABS: 1.0000 | ORAL_TABLET | Freq: Four times a day (QID) | ORAL | Status: DC | PRN

## 2014-05-27 NOTE — ED Provider Notes (Signed)
CSN: [id number]     Arrival date & time 05/27/14  0116 History   First MD Initiated Contact with Patient 05/27/14 0144     Chief Complaint  Patient presents with  . Chest Pain     (Consider location/radiation/quality/duration/timing/severity/associated sxs/prior Treatment) HPI Comments: Patient reports, that he has recurrent episodic pain.  That starts in his left upper quadrant, radiates to his left chest wall, and then across to his right chest wall.  This is been going on for quite a while getting worse.  Over the past 2, days.  Tonight.  He became frightened because when he had the pain in his left chest.  He also had some numbness and tingling in his left arm. The pain is relieved by putting pressure on his chest wall. He voices concern that he may have cardiac disease, versus diabetes versus cancer procedures.  All run in his family. In 2013.  She had an admission to the hospital to rule out cardiac disease.  Has not followed up with a physician.  Since that time.  Patient is a 58 y.o. male presenting with chest pain. The history is provided by the patient.  Chest Pain Pain location:  L chest and R lateral chest Pain quality: stabbing   Pain radiates to the back: no   Pain severity:  Moderate Onset quality:  Gradual Duration:  2 days Timing:  Sporadic Progression:  Unchanged Chronicity:  Recurrent Context: at rest and stress   Context: not breathing, no drug use, no intercourse, not lifting, no movement, not raising an arm and no trauma   Relieved by:  Certain positions (pressure on chest wall) Worsened by:  Nothing tried Ineffective treatments:  None tried Associated symptoms: anxiety and numbness   Associated symptoms: no cough, no diaphoresis, no dizziness, no nausea and no weakness     Past Medical History  Diagnosis Date  . Asthma    History reviewed. No pertinent past surgical history. Family History  Problem Relation Age of Onset  . CAD Father   . CAD Sister    . CAD Brother   . Cancer Other   . Hypertension Other   . Diabetes Other    History  Substance Use Topics  . Smoking status: Current Every Day Smoker -- 1.00 packs/day for 20 years    Types: Cigarettes  . Smokeless tobacco: Current User  . Alcohol Use: Yes    Review of Systems  Constitutional: Negative for diaphoresis.  Respiratory: Negative for cough.   Cardiovascular: Positive for chest pain. Negative for leg swelling.  Gastrointestinal: Negative for nausea.  Skin: Negative for rash and wound.  Neurological: Positive for numbness. Negative for dizziness and weakness.  All other systems reviewed and are negative.     Allergies  Review of patient's allergies indicates no known allergies.  Home Medications   Prior to Admission medications   Medication Sig Start Date End Date Taking? Authorizing Provider  Doxylamine-DM (VICKS NYQUIL COUGH) 6.25-15 MG/15ML LIQD Take 30 mLs by mouth at bedtime as needed (for sleep).   Yes Historical Provider, MD   BP 124/86  Pulse 93  Temp(Src) 98.9 F (37.2 C) (Oral)  Resp 18  SpO2 97% Physical Exam  Nursing note and vitals reviewed. Constitutional: He appears well-developed and well-nourished. No distress.  HENT:  Head: Normocephalic and atraumatic.  Eyes: Pupils are equal, round, and reactive to light.  Neck: Normal range of motion.  Cardiovascular: Normal rate.   Pulmonary/Chest: Effort normal. He exhibits no tenderness.  Abdominal: Soft. He exhibits no distension. There is tenderness.  Musculoskeletal: Normal range of motion.  Neurological: He is alert.  Skin: Skin is warm. He is not diaphoretic.    ED Course  Procedures (including critical care time) Labs Review Labs Reviewed  CBC - Abnormal; Notable for the following:    MCHC 37.7 (*)    All other components within normal limits  BASIC METABOLIC PANEL - Abnormal; Notable for the following:    Glucose, Bld 103 (*)    GFR calc non Af Amer 82 (*)    Anion gap 16 (*)     All other components within normal limits  TROPONIN I    Imaging Review No results found.   EKG Interpretation   Date/Time:  Sunday May 27 2014 01:23:29 EDT Ventricular Rate:  92 PR Interval:  173 QRS Duration: 83 QT Interval:  335 QTC Calculation: 414 R Axis:   -46 Text Interpretation:  Ectopic atrial rhythm LAD, consider left anterior  fascicular block Confirmed by DELOS  MD, DOUGLAS ([id number]) on 05/27/2014  2:05:04 AM      MDM   Final diagnoses:  Anxiety  Chest discomfort   Patient does not want to stay for any additional testing, states he is, afraid to find out if he has cancer, or diabetes or heart disease.  He really does not want to know the answer.  He, states he is no longer having any symptoms.  He's been encouraged to stay but refuses.      Garald Balding, NP 05/27/14 [phone]

## 2014-05-27 NOTE — ED Notes (Signed)
Pt presents with c/o chest pain that started 2 days ago  Pt states the pain is sharp in nature and goes across his chest from left to right and up into his left shoulder  Pt states he has had some shortness of breath with the chest pain

## 2014-05-27 NOTE — ED Notes (Signed)
Patient reports LUQ abdominal pain, described as "pinch/grab" that radiates to left upper chest and across to right upper chest. Rates 10/10. SOB, lightheadedness, diaphoretic earlier. Patient is not diaphoretic at this time, 97% on room air.

## 2014-05-27 NOTE — ED Provider Notes (Signed)
CSN: [id number]     Arrival date & time 05/27/14  1106 History   First MD Initiated Contact with Patient 05/27/14 1204     Chief Complaint  Patient presents with  . Chest Pain  . Rectal Bleeding     (Consider location/radiation/quality/duration/timing/severity/associated sxs/prior Treatment) Patient is a 58 y.o. male presenting with chest pain and hematochezia. The history is provided by the patient.  Chest Pain Pain location:  L chest Pain quality: pressure   Pain radiates to:  L arm Pain radiates to the back: no   Pain severity:  Moderate Onset quality:  Gradual Duration: intermittent lasting about 10 minutes at a time. Timing:  Intermittent Progression:  Unchanged Chronicity:  Recurrent Context: at rest   Relieved by:  Nothing Worsened by:  Nothing tried Ineffective treatments:  None tried Associated symptoms: shortness of breath   Associated symptoms: no abdominal pain, no cough, no fever, no headache, no nausea, no numbness and not vomiting   Rectal Bleeding Quality:  Bright red Amount:  Moderate Timing: once. Progression:  Unchanged Chronicity:  Recurrent Context: hemorrhoids   Similar prior episodes: yes   Relieved by:  Nothing Worsened by:  Nothing tried Ineffective treatments:  None tried Associated symptoms: no abdominal pain, no fever and no vomiting     Past Medical History  Diagnosis Date  . Asthma    No past surgical history on file. Family History  Problem Relation Age of Onset  . CAD Father   . CAD Sister   . CAD Brother   . Cancer Other   . Hypertension Other   . Diabetes Other    History  Substance Use Topics  . Smoking status: Current Every Day Smoker -- 1.00 packs/day for 20 years    Types: Cigarettes  . Smokeless tobacco: Current User  . Alcohol Use: Yes    Review of Systems  Constitutional: Negative for fever.  HENT: Negative for drooling and rhinorrhea.   Eyes: Negative for pain.  Respiratory: Positive for chest tightness and  shortness of breath. Negative for cough.   Cardiovascular: Positive for chest pain. Negative for leg swelling.  Gastrointestinal: Positive for hematochezia. Negative for nausea, vomiting, abdominal pain and diarrhea.  Genitourinary: Negative for dysuria and hematuria.       Bloody stool   Musculoskeletal: Negative for gait problem and neck pain.  Skin: Negative for color change.  Neurological: Negative for numbness and headaches.  Hematological: Negative for adenopathy.  Psychiatric/Behavioral: Negative for behavioral problems.  All other systems reviewed and are negative.     Allergies  Review of patient's allergies indicates no known allergies.  Home Medications   Prior to Admission medications   Not on File   BP 142/75  Pulse 92  Temp(Src) 98.6 F (37 C) (Oral)  Resp 18  SpO2 100% Physical Exam  Nursing note and vitals reviewed. Constitutional: He is oriented to person, place, and time. He appears well-developed and well-nourished.  HENT:  Head: Normocephalic and atraumatic.  Right Ear: External ear normal.  Left Ear: External ear normal.  Nose: Nose normal.  Mouth/Throat: Oropharynx is clear and moist. No oropharyngeal exudate.  Eyes: Conjunctivae and EOM are normal. Pupils are equal, round, and reactive to light.  Neck: Normal range of motion. Neck supple.  Cardiovascular: Normal rate, regular rhythm, normal heart sounds and intact distal pulses.  Exam reveals no gallop and no friction rub.   No murmur heard. Pulmonary/Chest: Effort normal and breath sounds normal. No respiratory distress. He has  no wheezes.  Abdominal: Soft. Bowel sounds are normal. He exhibits no distension. There is no tenderness. There is no rebound and no guarding.  Genitourinary:  Mildly enlarged hemorrhoid with small amount of bleeding noted at the inferior aspect of his external rectum.  Musculoskeletal: Normal range of motion. He exhibits no edema and no tenderness.  Neurological: He is  alert and oriented to person, place, and time.  Skin: Skin is warm and dry.  Psychiatric: He has a normal mood and affect. His behavior is normal.    ED Course  Procedures (including critical care time) Labs Review Labs Reviewed  CBC - Abnormal; Notable for the following:    MCHC 37.3 (*)    All other components within normal limits  BASIC METABOLIC PANEL - Abnormal; Notable for the following:    Glucose, Bld 132 (*)    GFR calc non Af Amer 80 (*)    Anion gap 16 (*)    All other components within normal limits  PRO B NATRIURETIC PEPTIDE  I-STAT TROPOININ, ED  POC OCCULT BLOOD, ED    Imaging Review Dg Chest 2 View  05/27/2014   CLINICAL DATA:  Shortness of breath and chest pain for 7 days. History of smoking.  EXAM: CHEST  2 VIEW  COMPARISON:  09/18/2012  FINDINGS: Grossly unchanged borderline enlarged cardiac silhouette. Normal mediastinal contours. The lungs are hyperexpanded with nodular thickening of the pulmonary interstitium. No focal airspace opacities. No pleural effusion or pneumothorax. No evidence of edema. No acute osseus abnormalities. Degenerative change of the bilateral acromioclavicular joints.  IMPRESSION: Similar findings of lung hyperexpansion and bronchitic change without acute cardiopulmonary disease.   Electronically Signed   By: Sandi Mariscal M.D.   On: 05/27/2014 13:03     EKG Interpretation   Date/Time:  Sunday May 27 2014 11:12:50 EDT Ventricular Rate:  94 PR Interval:  167 QRS Duration: 78 QT Interval:  334 QTC Calculation: 418 R Axis:   6 Text Interpretation:  Sinus rhythm LVH by voltage since last tracing no  significant change Confirmed by BELFI  MD, MELANIE ([id number]) on 05/27/2014  11:22:16 AM      MDM   Final diagnoses:  Other chest pain  Hemorrhoids, unspecified hemorrhoid type    12:16 PM 58 y.o. male with a family history of heart disease and hx of tobacco abuse who presents with chest pain. The patient was seen here last night for  similar symptoms the left because he was scared that he might receive a diagnosis of cancer. His chest pain yesterday evening was described as pressure in his left chest and left arm which was intermittent lasting minutes at a time. He states that this morning he began having a stinging sensation in his left chest which lasted for a few minutes and then resolved. He also said that he saw a moderate amount of blood in his stool and has seen this before. He has a history of hemorrhoids. He is afebrile and vital signs are unremarkable here. He currently complains of some mild left chest tightness. He denies any pleuritic-type pain or shortness of breath. Will get screening labs and imaging.  Of note his workup last night was noncontributory.  I interpreted/reviewed the labs and/or imaging which were non-contributory.  Pt feeling better. CP atypical today. Trop again neg 10 hrs later. Pt is low risk for MACE per HEART score. Pt happy w/ outpt f/u. Pt's hemeoccult was neg although I did see a small area of blood on  a mildly enlarge hemorrhoid. I have discussed the diagnosis/risks/treatment options with the patient and believe the pt to be eligible for discharge home to follow-up with and estab w/ a pcp. We also discussed returning to the ED immediately if new or worsening sx occur. We discussed the sx which are most concerning (e.g., return of cp, fever, sob, diaphoresis) that necessitate immediate return. Medications administered to the patient during their visit and any new prescriptions provided to the patient are listed below.  Medications given during this visit Medications  oxyCODONE-acetaminophen (PERCOCET/ROXICET) 5-325 MG per tablet 1 tablet (1 tablet Oral Given 05/27/14 1235)    Discharge Medication List as of 05/27/2014  2:58 PM    START taking these medications   Details  bisacodyl (DULCOLAX) 5 MG EC tablet Take 1 tablet (5 mg total) by mouth 2 (two) times daily., Starting 05/27/2014, Until  Discontinued, Print    hydrocortisone (ANUSOL-HC) 2.5 % rectal cream Apply rectally 2 times daily, Print    oxyCODONE-acetaminophen (PERCOCET) 5-325 MG per tablet Take 1 tablet by mouth every 6 (six) hours as needed for moderate pain., Starting 05/27/2014, Until Discontinued, Print           Pamella Pert, MD 05/28/14 [phone]

## 2014-05-27 NOTE — Discharge Instructions (Signed)

## 2014-05-27 NOTE — ED Notes (Signed)
Initial Contact - pt resting on stretcher, placed to cardiac/02 monitor.  Pt reports L hand/arm pain which radiates into his left chest.  7/10 pain at this time.  Describes as "pins and needles".  Pt reports improved with sitting up and applying pressure to his chest.  Speaking full/clear sentences, rr even/un-lab.  Pt also reports episodes BRBPR this AM with BM.  Pt denies other abd complaints.  Skin PWD.  MAEI.  NAD.

## 2014-05-27 NOTE — ED Notes (Signed)
Pt states he was here last night for same.  C/o CP that radiates to lt arm w/ pins and needles.  States that he was intoxicated last night so he left AMA.  States that he woke up this morning having the same pain.  Also states that he was having bright red rectal bleeding that he attributes to hemorrhoids.

## 2014-05-27 NOTE — ED Provider Notes (Signed)
Medical screening examination/treatment/procedure(s) were performed by non-physician practitioner and as supervising physician I was immediately available for consultation/collaboration.   EKG Interpretation   Date/Time:  Sunday May 27 2014 01:23:29 EDT Ventricular Rate:  92 PR Interval:  173 QRS Duration: 83 QT Interval:  335 QTC Calculation: 414 R Axis:   -46 Text Interpretation:  Ectopic atrial rhythm LAD, consider left anterior  fascicular block Confirmed by Beau Fanny  MD, Gianny Sabino ([id number]) on 05/27/2014  2:05:04 AM       Veryl Speak, MD 05/27/14 [id number]

## 2014-05-27 NOTE — Discharge Instructions (Signed)
Chest Pain (Nonspecific) °It is often hard to give a diagnosis for the cause of chest pain. There is always a chance that your pain could be related to something serious, such as a heart attack or a blood clot in the lungs. You need to follow up with your doctor. °HOME CARE °· If antibiotic medicine was given, take it as directed by your doctor. Finish the medicine even if you start to feel better. °· For the next few days, avoid activities that bring on chest pain. Continue physical activities as told by your doctor. °· Do not use any tobacco products. This includes cigarettes, chewing tobacco, and e-cigarettes. °· Avoid drinking alcohol. °· Only take medicine as told by your doctor. °· Follow your doctor's suggestions for more testing if your chest pain does not go away. °· Keep all doctor visits you made. °GET HELP IF: °· Your chest pain does not go away, even after treatment. °· You have a rash with blisters on your chest. °· You have a fever. °GET HELP RIGHT AWAY IF:  °· You have more pain or pain that spreads to your arm, neck, jaw, back, or belly (abdomen). °· You have shortness of breath. °· You cough more than usual or cough up blood. °· You have very bad back or belly pain. °· You feel sick to your stomach (nauseous) or throw up (vomit). °· You have very bad weakness. °· You pass out (faint). °· You have chills. °This is an emergency. Do not wait to see if the problems will go away. Call your local emergency services (911 in [place] Do not drive yourself to the hospital. °MAKE SURE YOU:  °· Understand these instructions. °· Will watch your condition. °· Will get help right away if you are not doing well or get worse. °Document Released: 02/24/2008 Document Revised: 09/12/2013 Document Reviewed: 02/24/2008 °ExitCare® Patient Information ©2015 [employer]. This information is not intended to replace advice given to you by your health care provider. Make sure you discuss any questions you have with your  health care provider. ° ° °Emergency Department Resource Guide °1) Find a Doctor and Pay Out of Pocket °Although you won't have to find out who is covered by your insurance plan, it is a good idea to ask around and get recommendations. You will then need to call the office and see if the doctor you have chosen will accept you as a new patient and what types of options they offer for patients who are self-pay. Some doctors offer discounts or will set up payment plans for their patients who do not have insurance, but you will need to ask so you aren't surprised when you get to your appointment. ° °2) Contact Your Local Health Department °Not all health departments have doctors that can see patients for sick visits, but many do, so it is worth a call to see if yours does. If you don't know where your local health department is, you can check in your phone book. The CDC also has a tool to help you locate your state's health department, and many state websites also have listings of all of their local health departments. ° °3) Find a Walk-in Clinic °If your illness is not likely to be very severe or complicated, you may want to try a walk in clinic. These are popping up all over the country in pharmacies, drugstores, and shopping centers. They're usually staffed by nurse practitioners or physician assistants that have been trained to treat common   illnesses and complaints. They're usually fairly quick and inexpensive. However, if you have serious medical issues or chronic medical problems, these are probably not your best option. ° °No Primary Care Doctor: °- Call [facility] at  832-8000 - they can help you locate a primary care doctor that  accepts your insurance, provides certain services, etc. °- Physician Referral Service- 1-800-533-3463 ° °Chronic Pain Problems: °Organization         Address  Phone   Notes  °[facility]  (336) 297-2271 Patients need to be referred by their primary care doctor.   ° °Medication Assistance: °Organization         Address  Phone   Notes  °[place] [facility] [address] °[place], [place] [address] (336) 641-8030 --Must be a resident of [place] °-- Must have NO insurance coverage whatsoever (no Medicaid/ Medicare, etc.) °-- The pt. MUST have a primary care doctor that directs their care regularly and follows them in the community °  °[facility]  (866) 331-1348   °[employer]  (888) 892-1162   ° °Agencies that provide inexpensive medical care: °Organization         Address  Phone   Notes  °[facility]  (336) 832-8035   °[facility]    (336) 832-7272   °[facility] [address] °[place], [place] [address] (336) 832-4777   °[facility] [address], °[place] (336) 271-4999   °[facility]    (336) 373-0678   °[facility]    (336) 272-1050   °[facility] ° [address], [place] Phone:  (336) 832-4444, Fax:  (336) 832-4440 Hours of Operation:  9 am - 6 pm, M-F.  Also accepts Medicaid/Medicare and self-pay.  °[facility] ° [address], [place] Phone: (336) 832-3150, Fax: (336) [phone]. Hours of Operation:  8:30 am - 5:30 pm, M-F.  Also accepts Medicaid and self-pay.  °[facility] [place] [address], [place] Phone: (336) 878-6027   °[facility] [address], [place], [place] (336)723-1848, Ext. 123 Mondays & Thursdays: 7-9 AM.  First 15 patients are seen on a first come, first serve basis. °  ° °Medicaid-accepting [place] Providers: ° °Organization         Address  Phone   Notes  °[facility] [address], [place] (336) 641-2100 Also accepts self-pay patients.  °[facility] [address], [place] ° (336) 856-9996   °[facility] [address], [place] (336) 288-8857   °[facility] [address] [place] [address], [place] (336) 299-7000   °Veita Bland [address], [place]  ° (336) 373-1557 Only accepts [place] Access Medicaid patients after they have their name applied to their card.  ° °Self-Pay (no insurance) in [place]: ° °Organization         Address  Phone   Notes  °Sickle Cell Patients, [facility] [address], [place] (336) 832-1970   °[facility] [address],  (336) 832-4400   °[facility] ° [address], [place] (336) 992-4800   °[facility]/Dr. Osei-Bonsu ° [address] [place] [address],  or [address],   [place] (336) 841-8500 Phone number for both [place] and [place] locations is the same.  °[facility] [address], [place] (336) 299-0000   °[facility] [address] [place] [address], [place] or [address] (336) 852-7530 °(336) 878-2260   °[facility] [address], [place] (336) 350-1642, phone; (336) 294-5005, fax Sees patients 1st and 3rd Saturday of every month.  Must not qualify for public or private insurance (i.e. Medicaid, Medicare, [place] Health Choice, Veterans' Benefits) • Household income should be no more than 200% of the poverty level •The clinic cannot treat you if you are pregnant or think you are pregnant • Sexually transmitted diseases are not treated at the clinic.  ° ° °Dental Care: °Organization         Address  Phone  Notes  °[facility] [address], [place] (336) 641-6152 Accepts children up to age 21 who are enrolled in Medicaid or [place] Health Choice; pregnant women with a Medicaid card; and children who have applied for Medicaid or [place] Health Choice, but were declined, whose parents can pay a reduced fee at time of service.    °[facility] [place]  [address], [place] (336) 641-7733 Accepts children up to age 21 who are enrolled in Medicaid or [place] Health Choice; pregnant women with a Medicaid card; and children who have applied for Medicaid or [place] Health Choice, but were declined, whose parents can pay a reduced fee at time of service.  °[facility] ° [address], [place] (336) 641-4533 Patients are seen by appointment only. Walk-ins are not accepted. [facility] will see patients 18 years of age and older. °Monday - Tuesday (8am-5pm) °Most Wednesdays (8:30-5pm) °$30 per visit, cash only  °[facility] ° [address], [place] (336) 641-4533 Patients are seen by appointment only. Walk-ins are not accepted. [facility] will see patients 18 years of age and older. °One Wednesday Evening (Monthly: Volunteer Based).  $30 per visit, cash only  °[facility]  (919) 537-3737 for adults; Children under age 4, call Graduate Pediatric Dentistry at ([phone]) [phone]. Children aged 4-14, please call (919) [phone] to request a pediatric application. ° Dental services are provided in all areas of dental care including fillings, crowns and bridges, complete and partial dentures, implants, gum treatment, root canals, and extractions. Preventive care is also provided. Treatment is provided to both adults and children. °Patients are selected via a lottery and there is often a waiting list. °  °[facility] [address], ° ° (336) 763-8833 [email or web address] °  °[facility] [address], [place], [place] (336)723-1848, Ext. 123 Second and Fourth Thursday of each month, opens at 6:30 AM; Clinic ends at 9 AM.  Patients are seen on a first-come first-served basis, and a limited number are seen during each clinic.  ° °[facility] ° [address], [place], [place] (336)  723-7904   Eligibility Requirements °You must have lived in [place], [place], or [place] counties for at least the last three months. °  You cannot be eligible for state or federal sponsored healthcare insurance, including Veterans Administration, Medicaid, or Medicare. °  You generally cannot be eligible for healthcare insurance through your employer.  °  How to apply: °Eligibility screenings are held every Tuesday   and Wednesday afternoon from 1:00 pm until 4:00 pm. You do not need an appointment for the interview!  °[facility] [address], [place], [place] 336-631-2330   °[facility]  336-342-8273   °[facility]  336-703-3100   °[facility]  336-570-6415   ° °Behavioral Health Resources in the Community: °Intensive Outpatient Programs °Organization         Address  Phone  Notes  °[facility] [address], [place], [place] 336-878-6098   °[facility] [address], [place], [place] 336-832-9800   °[facility] [address], [place], [place] ° 336-882-2125   °[facility] [address],  °[place], [place] 1-800-853-5163 or 336-641-4981   °Substance Abuse Resources °Organization         Address  Phone  Notes  °[facility]  336-882-2125   °[facility]  336-784-9470   °[facility]  336-285-9073   °[facility]  336-845-3988   °Residential & Outpatient Substance Abuse Program  1-800-659-3381   °Psychological Services °Organization         Address  Phone  Notes  °[facility]  336- 832-9600   °[facility]  336- 378-7881   °[facility] [address], [place] 1-800-853-5163 or 336-641-4981   ° °Mobile Crisis Teams °Organization         Address  Phone  Notes  °[facility], Mobile Crisis Care Unit  1-877-626-1772   °Assertive °[facility] ° [address], [place] 336-834-9664   °Sharon DeEsch [address] °[place] 336-554-5454   ° °Self-Help/Support Groups °Organization         Address  Phone             Notes  °[facility] [place] - variety of support groups  336- 373-1402 Call for more information  °Narcotics Anonymous (NA), [facility] [address], °[place]  2 meetings at this location  ° °Residential Treatment Programs °Organization         Address  Phone  Notes  °[facility] [address],    °[place]  1-866-801-8205   °[facility] ° [address], [place], [place] 704-293-8524   °[facility] [address], [place] 336-845-3988 Admissions: 8am-3pm M-F  °[facility] [address]    °[place], [place] 336-841-1104   °[facility] [address], [place], [place] 336-379-7146   °[facility] [address]  °[place], [place] 336-285-9073   °[facility] Intensive Outpatient [address], [place], [place] 336-852-3033   °[facility] [address]  °[place], [place] 1-877-615-2722 or 336-784-9470   °[facility] ([facility] [address] , [place] 336-227-7417 Accepts Medicaid  °[facility] [address]  ° [place] 1-800-659-3381 Substance Abuse/Addiction Treatment  ° °[place] Behavioral Health Resources °Organization         Address  Phone  Notes  °[facility]  (888) 581-9988   °Julie Brannon, PhD [address] [place], [place]   (336) 349-5553 or (336) 951-0000   °[facility]   [address] °[place], [place] (336) 349-4454   °[facility] [address], [place], [place] (336) 342-8316 Insurance/Medicaid/sponsorship through [facility]  °[facility] [address] [place], [place] 860-554-0293  [place], [place] (506)[phone]    Dr. Adele Schilder  858-743-1719   [facility] Dept. 1) [address] [place] [address], [place] 2) [address], [place] 3)  [address], [place] [phone]  [phone]   [place] (539)[phone] or [phone] (After Hours)     Tonight your labs are normal If you change your mind about further evaluation please make an apointment with a PCP or return to the ED

## 2014-06-07 ENCOUNTER — Encounter: Payer: Self-pay | Admitting: Family Medicine

## 2014-06-07 ENCOUNTER — Ambulatory Visit: Payer: PRIVATE HEALTH INSURANCE | Attending: Family Medicine | Admitting: Family Medicine

## 2014-06-07 VITALS — BP 127/84 | HR 68 | Temp 98.3°F | Resp 16 | Ht 62.0 in | Wt 163.0 lb

## 2014-06-07 DIAGNOSIS — J449 Chronic obstructive pulmonary disease, unspecified: Secondary | ICD-10-CM

## 2014-06-07 DIAGNOSIS — R079 Chest pain, unspecified: Secondary | ICD-10-CM | POA: Diagnosis not present

## 2014-06-07 DIAGNOSIS — R7309 Other abnormal glucose: Secondary | ICD-10-CM | POA: Insufficient documentation

## 2014-06-07 DIAGNOSIS — F17200 Nicotine dependence, unspecified, uncomplicated: Secondary | ICD-10-CM

## 2014-06-07 DIAGNOSIS — R739 Hyperglycemia, unspecified: Secondary | ICD-10-CM

## 2014-06-07 DIAGNOSIS — K625 Hemorrhage of anus and rectum: Secondary | ICD-10-CM | POA: Diagnosis not present

## 2014-06-07 DIAGNOSIS — R0789 Other chest pain: Secondary | ICD-10-CM

## 2014-06-07 DIAGNOSIS — K644 Residual hemorrhoidal skin tags: Secondary | ICD-10-CM | POA: Insufficient documentation

## 2014-06-07 DIAGNOSIS — Z23 Encounter for immunization: Secondary | ICD-10-CM

## 2014-06-07 DIAGNOSIS — K219 Gastro-esophageal reflux disease without esophagitis: Secondary | ICD-10-CM | POA: Diagnosis not present

## 2014-06-07 DIAGNOSIS — K649 Unspecified hemorrhoids: Secondary | ICD-10-CM

## 2014-06-07 DIAGNOSIS — J4489 Other specified chronic obstructive pulmonary disease: Secondary | ICD-10-CM | POA: Insufficient documentation

## 2014-06-07 HISTORY — DX: Chronic obstructive pulmonary disease, unspecified: J44.9

## 2014-06-07 LAB — LIPID PANEL
Cholesterol: 147 mg/dL (ref 0–200)
HDL: 41 mg/dL (ref >39)
LDL Cholesterol: 95 mg/dL (ref 0–99)
TRIGLYCERIDES: 53 mg/dL (ref <150)
Total CHOL/HDL Ratio: 3.6 Ratio
VLDL: 11 mg/dL (ref 0–40)

## 2014-06-07 LAB — HEPATIC FUNCTION PANEL
ALBUMIN: 4.2 g/dL (ref 3.5–5.2)
ALK PHOS: 65 U/L (ref 39–117)
ALT: 16 U/L (ref 0–53)
AST: 16 U/L (ref 0–37)
Bilirubin, Direct: 0.2 mg/dL (ref 0.0–0.3)
Indirect Bilirubin: 0.5 mg/dL (ref 0.2–1.2)
Total Bilirubin: 0.7 mg/dL (ref 0.2–1.2)
Total Protein: 7.1 g/dL (ref 6.0–8.3)

## 2014-06-07 LAB — POCT GLYCOSYLATED HEMOGLOBIN (HGB A1C): HEMOGLOBIN A1C: 5.4

## 2014-06-07 MED ORDER — ALBUTEROL SULFATE HFA 108 (90 BASE) MCG/ACT IN AERS: 2.0000 | INHALATION_SPRAY | Freq: Four times a day (QID) | RESPIRATORY_TRACT | Status: DC | PRN

## 2014-06-07 MED ORDER — OMEPRAZOLE 20 MG PO CPDR: 20.0000 mg | DELAYED_RELEASE_CAPSULE | Freq: Every day | ORAL | Status: DC

## 2014-06-07 NOTE — Assessment & Plan Note (Signed)
3. Hemorrhoids: At this referral to GI to discuss treatment for hemorrhoids and for followup colonoscopy. Please call your previous

## 2014-06-07 NOTE — Progress Notes (Signed)
   Subjective:    Patient ID: Joshua Stephenson, male    DOB: April 26, 1956, 58 y.o.   MRN: [id number] CC: Rectal bleeding associated with hemorrhoids, left chest pain   HPI 58 year old male presents to establish care discussed the following:  #1 rectal bleeding: noticed first in 2011, last noticed blood this AM at 9:30 AM. No rectal pain. In general BMs occurs every 2-3 days, no straining. No hard stool. No loose stool.   #2 left chest pain: upper and lower, sharp, intermittent. Sometimes with shortness of breath. Never with nausea and vomiting. This has been a problem x years. Nighttime cough, dry, scratchy pretty much every night.    social history: Chronic smoker Review of Systems  as per history of present illness  Occasional palpitations.     Objective:   Physical Exam BP 127/84  Pulse 68  Temp(Src) 98.3 F (36.8 C) (Oral)  Resp 16  Ht 5\' 2"  (1.575 m)  Wt 163 lb (73.936 kg)  BMI 29.81 kg/m2  SpO2 99% General appearance: alert, cooperative and no distress Lungs: clear to auscultation bilaterally Heart: regular rate and rhythm, S1, S2 normal, no murmur, click, rub or gallop Abdomen: soft, non-tender; bowel sounds normal; no masses,  no organomegaly Rectal: external hemorrhoid at 6 o'clock, FOBT positive      Assessment & Plan:

## 2014-06-07 NOTE — Progress Notes (Signed)
Establish care Pt complaining of LUQ pain  Pt stated been having rectal bleeding Hx hemorrhoid

## 2014-06-07 NOTE — Assessment & Plan Note (Signed)
Start PPI 

## 2014-06-07 NOTE — Assessment & Plan Note (Signed)
A: CXR and symptoms consistent with mild COPD P: 2. chest pains/? COPD: use albuterol as needed. Start Prilosec daily for reflux symptoms. Referral to the family medicine Center for pulmonary function testing.

## 2014-06-07 NOTE — Assessment & Plan Note (Signed)
A: checked A1c. Normal.

## 2014-06-07 NOTE — Patient Instructions (Addendum)
Joshua Stephenson,  Thank you for coming in today. It was a pleasure meeting you. I look forward to be a primary doctor.   1. Smoking: Smoking cessation support: smoking cessation hotline: 1-800-QUIT-NOW.  Smoking cessation classes are available through [facility]. Call 4193669906 or visit our website at [email or web address] Set a quit date. write down your quit date on the visible calendar. Tell your family and friends about your plans to quit.  2. chest pains/? COPD: use albuterol as needed. Start Prilosec daily for reflux symptoms. Referral to the family medicine Center for pulmonary function testing.  3. Hemorrhoids: At this referral to GI to discuss treatment for hemorrhoids and for followup colonoscopy. Please call your previous GI doctor and have them fax the report of your last colonoscopy to my office.  You'll be called lab results.  Plan to follow up in 4 weeks sooner if needed.  Dr. Adrian Blackwater

## 2014-06-11 ENCOUNTER — Telehealth: Payer: Self-pay | Admitting: *Deleted

## 2014-06-11 NOTE — Telephone Encounter (Signed)
Message copied by Betti Cruz on Mon Jun 11, 2014  3:20 PM ------      Message from: Boykin Nearing      Created: Fri Jun 08, 2014 10:11 AM       Normal liver function test and cholesterol ------

## 2014-06-11 NOTE — Telephone Encounter (Signed)
Left voice massage to return call 

## 2014-06-12 ENCOUNTER — Encounter: Payer: Self-pay | Admitting: Internal Medicine

## 2014-06-12 ENCOUNTER — Telehealth: Payer: Self-pay | Admitting: Family Medicine

## 2014-06-12 ENCOUNTER — Telehealth: Payer: Self-pay | Admitting: Emergency Medicine

## 2014-06-12 NOTE — Telephone Encounter (Signed)
Pt given LFT results

## 2014-06-12 NOTE — Telephone Encounter (Signed)
Patient's wife called in to check the status of her husband's referrals; GI referral was explained however a referral for a preliminary function test was mentioned; please f/u with patient about this referral;

## 2014-07-02 ENCOUNTER — Ambulatory Visit: Payer: PRIVATE HEALTH INSURANCE | Admitting: Family Medicine

## 2014-08-09 ENCOUNTER — Encounter: Payer: Self-pay | Admitting: Gastroenterology

## 2014-08-14 ENCOUNTER — Ambulatory Visit: Payer: PRIVATE HEALTH INSURANCE | Admitting: Internal Medicine

## 2014-08-15 ENCOUNTER — Encounter: Payer: Self-pay | Admitting: Internal Medicine

## 2014-08-15 NOTE — Progress Notes (Signed)
The patient's chart has been reviewed by Dr. Carlean Purl  and the recommendations are noted below.    Follow-up advised. Contact patient, letter mailed.

## 2017-06-08 ENCOUNTER — Other Ambulatory Visit: Payer: Self-pay

## 2017-06-16 ENCOUNTER — Ambulatory Visit (INDEPENDENT_AMBULATORY_CARE_PROVIDER_SITE_OTHER): Payer: Commercial Managed Care - PPO | Admitting: Family Medicine

## 2017-06-16 ENCOUNTER — Encounter: Payer: Self-pay | Admitting: Family Medicine

## 2017-06-16 VITALS — BP 120/70 | HR 77 | Temp 98.3°F | Ht 67.5 in | Wt 154.2 lb

## 2017-06-16 DIAGNOSIS — Z13220 Encounter for screening for lipoid disorders: Secondary | ICD-10-CM

## 2017-06-16 DIAGNOSIS — Z125 Encounter for screening for malignant neoplasm of prostate: Secondary | ICD-10-CM

## 2017-06-16 DIAGNOSIS — M7541 Impingement syndrome of right shoulder: Secondary | ICD-10-CM | POA: Diagnosis not present

## 2017-06-16 DIAGNOSIS — K219 Gastro-esophageal reflux disease without esophagitis: Secondary | ICD-10-CM

## 2017-06-16 DIAGNOSIS — Z1211 Encounter for screening for malignant neoplasm of colon: Secondary | ICD-10-CM

## 2017-06-16 DIAGNOSIS — M25511 Pain in right shoulder: Secondary | ICD-10-CM

## 2017-06-16 DIAGNOSIS — M25512 Pain in left shoulder: Secondary | ICD-10-CM | POA: Diagnosis not present

## 2017-06-16 DIAGNOSIS — M7542 Impingement syndrome of left shoulder: Secondary | ICD-10-CM | POA: Diagnosis not present

## 2017-06-16 DIAGNOSIS — Z7689 Persons encountering health services in other specified circumstances: Secondary | ICD-10-CM | POA: Diagnosis not present

## 2017-06-16 DIAGNOSIS — Z720 Tobacco use: Secondary | ICD-10-CM

## 2017-06-16 DIAGNOSIS — Z131 Encounter for screening for diabetes mellitus: Secondary | ICD-10-CM | POA: Diagnosis not present

## 2017-06-16 MED ORDER — NICOTINE POLACRILEX 2 MG MT GUM: 2.0000 mg | CHEWING_GUM | OROMUCOSAL | 0 refills | Status: DC | PRN

## 2017-06-16 NOTE — Progress Notes (Addendum)
Patient presents to clinic today to establish care. Patient is accompanied by his wife.  SUBJECTIVE: PMH: Pt is a 61 yo AAM with pmh sig for COPD and GERD.  Pt did not have a PCP. Patient describes himself as fairly healthy/tries to avoid the doctor.  Of note, pt does not like needles.  States "we are good as long as you don't try to get any blood".  Bilateral shoulder pain, acute: -Shoulder pain 1 month -"Knots" on bilateral shoulders -Pain starts in anterior chest, moves to back/shoulder blade area -Patient has not tried anything for the pain -Patient does heavy lifting at work -Patient has not had any imaging  GERD: -Patient takes Nexium -Patient notes symptoms with spicy foods and dairy -Patient occasionally notes waking up with heartburn symptoms  Tobacco use: -Patient currently smoking 1 pack per day -States started at age 80 -Briefly stop smoking in the past. -Patient has tried patch, gum. However was not using the gum correctly -Patient undecided about quitting at this time  Allergies: NKDA  Past surgical history: -None  Social history: Patient is married.  Will be celebrating third anniversary tomorrow. Patient and his wife were married over 75 years ago however got a divorce and are now remarried. They have 3 children. The oldest is 35 and recently moved out of their house. Patient works at [employer]. States does a lot of material lifting. Patient currently smokes 1 pack per day. Patient denies alcohol and drug use.  Family history: Mom: Breast cancer with metastases to brain Dad: DM, bad heart PGF: Leg amputation Sis., Katharine Look: desc, HTN, obesity Sis Janice: Cancer Brother, JR: Mouth cancer Brother, Merry Proud: Deceased stabbed Baby brother: Had cancer but doing okay  Health maintenance: -Patient states he had a colonoscopy years ago, is due for another. Patient does not really want to have another done. -Patient has not had PSA done -Last  dental visit, 2 years ago -Last vision check sometime ago and asked. Did not have.eyes dilated.   Past Medical History:  Diagnosis Date  . Anxiety   . Asthma    as a child  . COPD, mild ([place]) 06/07/2014  . GERD (gastroesophageal reflux disease)   . Hemorrhoids     Past Surgical History:  Procedure Laterality Date  . COLONOSCOPY  2011    No current outpatient prescriptions on file prior to visit.   No current facility-administered medications on file prior to visit.     No Known Allergies  Family History  Problem Relation Age of Onset  . Diabetes Father   . Heart disease Father   . Cancer Mother        breast   . Cancer Sister        breast   . Cancer Brother        oral cancer   . Cancer Other   . Hypertension Other   . Diabetes Other     Social History   Social History  . Marital status: Divorced    Spouse name: N/A  . Number of children: 1   . Years of education: 50    Occupational History  . [employer] IT trainer   Social History Main Topics  . Smoking status: Current Every Day Smoker    Packs/day: 1.00    Years: 20.00    Types: Cigarettes  . Smokeless tobacco: Never Used  . Alcohol use 0.6 oz/week    1 Cans of beer per week  . Drug use:  No  . Sexual activity: Yes    Birth control/ protection: None   Other Topics Concern  . Not on file   Social History Narrative   Lives with fiance.    1 grown daughter lives [place] of [address]     ROS  General: Denies fever, chills, night sweats, changes in weight, changes in appetite HEENT: Denies headaches, ear pain, changes in vision, rhinorrhea, sore throat CV: Denies CP, palpitations, SOB, orthopnea Pulm: Denies SOB, cough, wheezing GI: Denies abdominal pain, nausea, vomiting, diarrhea, constipation  +GERD GU: Denies dysuria, hematuria, frequency, vaginal discharge Msk: Denies muscle cramps, joint pains  + bilateral shoulder pain with "knot" on each shoulder Neuro: Denies weakness,  numbness, tingling Skin: Denies rashes, bruising Psych: Denies depression, anxiety, hallucinations   BP 120/70 (BP Location: Right Arm, Patient Position: Sitting, Cuff Size: Normal)   Pulse 77   Temp 98.3 F (36.8 C) (Oral)   Ht 5' 7.5" (1.715 m)   Wt 154 lb 3.2 oz (69.9 kg)   BMI 23.79 kg/m   Physical Exam  Gen. Pleasant, well developed, well-nourished, in NAD HEENT - [place]/AT, arcus senilis, PERRL, EOMI, conjunctive clear, no scleral icterus, no nasal drainage, pharynx without erythema or exudate. Neck: No JVD, no thyromegaly, no carotid bruits Lungs: no accessory muscle use, CTAB, no wheezes, rales or rhonchi Cardiovascular: RRR, No r/g/m, no peripheral edema Abdomen: BS present, soft, nontender,nondistended, no hepatosplenomegaly Musculoskeletal: No deformities, moves all four extremities, no cyanosis or clubbing, normal tone.  Lateral end of clavicle raised b/l, TTP.  Positive cross arm b/l. +Hawkins test b/l.  Resistance to Neers test on R, Neers on L negative.  Negative empty can test b/l.  Strength 5/5 in UEs b/l. Neuro:  A&Ox3, CN II-XII intact, normal gait Skin:  Warm, dry, intact, no lesions Psych: normal affect, mood appropriate  No results found for this or any previous visit (from the past 2160 hour(s)).  Assessment/Plan:  Acute Shoulder pain, b/l -Possible ligament tear -Will obtain x-rays -Supportive care at this time-ice, NSAIDs -Limit strenuous activity  Impingement syndrome of right shoulder  - Plan: DG Shoulder Right  Impingement syndrome of left shoulder  - Plan: DG Shoulder Left  Screening for cholesterol level  - Plan: Lipid panel, Lipid panel  Tobacco use -Smoking cessation greater than 3 minutes, less than 10 -Currently smoking 1 pack per day. Started at age 65 -Has tried patch and gum, however was using gum incorrectly -Patient agrees to try gum, educated on use -Patient given handout about gum use -Rx for nicotine gum sent to  pharmacy -Patient also given information about 100 quit now number -Will follow-up in the next month  - Plan: nicotine polacrilex (NICORETTE STARTER KIT) 2 MG gum  Gastroesophageal reflux disease without esophagitis -Continue Nexium -Avoid food triggers - Plan: CBC with Differential/Platelet, Comprehensive metabolic panel, CBC with Differential/Platelet, Comprehensive metabolic panel  Colon cancer screening - Plan: HM COLONOSCOPY  Screening for prostate cancer  - Plan: PSA  Screening for diabetes mellitus  - Plan: Hemoglobin A1c, Hemoglobin A1c  Encounter to establish care -records release.    F/u in clinic in next 1-2 months.

## 2017-06-16 NOTE — Patient Instructions (Addendum)
Cholesterol Cholesterol is a fat. Your body needs a small amount of cholesterol. Cholesterol (plaque) may build up in your blood vessels (arteries). That makes you more likely to have a heart attack or stroke. You cannot feel your cholesterol level. Having a blood test is the only way to find out if your level is high. Keep your test results. Work with your doctor to keep your cholesterol at a good level. What do the results mean?  Total cholesterol is how much cholesterol is in your blood.  LDL is bad cholesterol. This is the type that can build up. Try to have low LDL.  HDL is good cholesterol. It cleans your blood vessels and carries LDL away. Try to have high HDL.  Triglycerides are fat that the body can store or burn for energy. What are good levels of cholesterol?  Total cholesterol below 200.  LDL below 100 is good for people who have health risks. LDL below 70 is good for people who have very high risks.  HDL above 40 is good. It is best to have HDL of 60 or higher.  Triglycerides below 150. How can I lower my cholesterol? Diet Follow your diet program as told by your doctor.  Choose fish, white meat chicken, or [place] that is roasted or baked. Try not to eat red meat, fried foods, sausage, or lunch meats.  Eat lots of fresh fruits and vegetables.  Choose whole grains, beans, pasta, potatoes, and cereals.  Choose olive oil, corn oil, or canola oil. Only use small amounts.  Try not to eat butter, mayonnaise, shortening, or palm kernel oils.  Try not to eat foods with trans fats.  Choose low-fat or nonfat dairy foods. ? Drink skim or nonfat milk. ? Eat low-fat or nonfat yogurt and cheeses. ? Try not to drink whole milk or cream. ? Try not to eat ice cream, egg yolks, or full-fat cheeses.  Healthy desserts include angel food cake, ginger snaps, animal crackers, hard candy, popsicles, and low-fat or nonfat frozen yogurt. Try not to eat pastries, cakes, pies, and  cookies.  Exercise Follow your exercise program as told by your doctor.  Be more active. Try gardening, walking, and taking the stairs.  Ask your doctor about ways that you can be more active.  Medicine  Take over-the-counter and prescription medicines only as told by your doctor.  This information is not intended to replace advice given to you by your health care provider. Make sure you discuss any questions you have with your health care provider. Document Released: 12/04/2008 Document Revised: 04/08/2016 Document Reviewed: 03/19/2016 Elsevier Interactive Patient Education  2017 [place].  Colonoscopy, Adult A colonoscopy is an exam to look at the large intestine. It is done to check for problems, such as:  Lumps (tumors).  Growths (polyps).  Swelling (inflammation).  Bleeding.  What happens before the procedure? Eating and drinking Follow instructions from your doctor about eating and drinking. These instructions may include:  A few days before the procedure - follow a low-fiber diet. ? Avoid nuts. ? Avoid seeds. ? Avoid dried fruit. ? Avoid raw fruits. ? Avoid vegetables.  1-3 days before the procedure - follow a clear liquid diet. Avoid liquids that have red or purple dye. Drink only clear liquids, such as: ? Clear broth or bouillon. ? Black coffee or tea. ? Clear juice. ? Clear soft drinks or sports drinks. ? Gelatin dessert. ? Popsicles.  On the day of the procedure - do not eat  or drink anything during the 2 hours before the procedure.  Bowel prep If you were prescribed an oral bowel prep:  Take it as told by your doctor. Starting the day before your procedure, you will need to drink a lot of liquid. The liquid will cause you to poop (have bowel movements) until your poop is almost clear or light green.  If your skin or butt gets irritated from diarrhea, you may: ? Wipe the area with wipes that have medicine in them, such as adult wet wipes with  aloe and vitamin E. ? Put something on your skin that soothes the area, such as petroleum jelly.  If you throw up (vomit) while drinking the bowel prep, take a break for up to 60 minutes. Then begin the bowel prep again. If you keep throwing up and you cannot take the bowel prep without throwing up, call your doctor.  General instructions  Ask your doctor about changing or stopping your normal medicines. This is important if you take diabetes medicines or blood thinners.  Plan to have someone take you home from the hospital or clinic. What happens during the procedure?  An IV tube may be put into one of your veins.  You will be given medicine to help you relax (sedative).  To reduce your risk of infection: ? Your doctors will wash their hands. ? Your anal area will be washed with soap.  You will be asked to lie on your side with your knees bent.  Your doctor will get a long, thin, flexible tube ready. The tube will have a camera and a light on the end.  The tube will be put into your anus.  The tube will be gently put into your large intestine.  Air will be delivered into your large intestine to keep it open. You may feel some pressure or cramping.  The camera will be used to take photos.  A small tissue sample may be removed from your body to be looked at under a microscope (biopsy). If any possible problems are found, the tissue will be sent to a lab for testing.  If small growths are found, your doctor may remove them and have them checked for cancer.  The tube that was put into your anus will be slowly removed. The procedure may vary among doctors and hospitals. What happens after the procedure?  Your doctor will check on you often until the medicines you were given have worn off.  Do not drive for 24 hours after the procedure.  You may have a small amount of blood in your poop.  You may pass gas.  You may have mild cramps or bloating in your belly (abdomen).  It  is up to you to get the results of your procedure. Ask your doctor, or the department performing the procedure, when your results will be ready. This information is not intended to replace advice given to you by your health care provider. Make sure you discuss any questions you have with your health care provider. Document Released: 10/10/2010 Document Revised: 07/08/2016 Document Reviewed: 11/19/2015 Elsevier Interactive Patient Education  2017 [place] with Quitting Smoking Quitting smoking is a physical and mental challenge. You will face cravings, withdrawal symptoms, and temptation. Before quitting, work with your health care provider to make a plan that can help you cope. Preparation can help you quit and keep you from giving in. How can I cope with cravings? Cravings usually last for 5-10 minutes. If  you get through it, the craving will pass. Consider taking the following actions to help you cope with cravings:  Keep your mouth busy: ? Chew sugar-free gum. ? Suck on hard candies or a straw. ? Brush your teeth.  Keep your hands and body busy: ? Immediately change to a different activity when you feel a craving. ? Squeeze or play with a ball. ? Do an activity or a hobby, like making bead jewelry, practicing needlepoint, or working with wood. ? Mix up your normal routine. ? Take a short exercise break. Go for a quick walk or run up and down stairs. ? Spend time in public places where smoking is not allowed.  Focus on doing something kind or helpful for someone else.  Call a friend or family member to talk during a craving.  Join a support group.  Call a quit line, such as 1-800-QUIT-NOW.  Talk with your health care provider about medicines that might help you cope with cravings and make quitting easier for you.  How can I deal with withdrawal symptoms? Your body may experience negative effects as it tries to get used to not having nicotine in the system. These  effects are called withdrawal symptoms. They may include:  Feeling hungrier than normal.  Trouble concentrating.  Irritability.  Trouble sleeping.  Feeling depressed.  Restlessness and agitation.  Craving a cigarette.  To manage withdrawal symptoms:  Avoid places, people, and activities that trigger your cravings.  Remember why you want to quit.  Get plenty of sleep.  Avoid coffee and other caffeinated drinks. These may worsen some of your symptoms.  How can I handle social situations? Social situations can be difficult when you are quitting smoking, especially in the first few weeks. To manage this, you can:  Avoid parties, bars, and other social situations where people might be smoking.  Avoid alcohol.  Leave right away if you have the urge to smoke.  Explain to your family and friends that you are quitting smoking. Ask for understanding and support.  Plan activities with friends or family where smoking is not an option.  What are some ways I can cope with stress? Wanting to smoke may cause stress, and stress can make you want to smoke. Find ways to manage your stress. Relaxation techniques can help. For example:  Breathe slowly and deeply, in through your nose and out through your mouth.  Listen to soothing, relaxing music.  Talk with a family member or friend about your stress.  Light a candle.  Soak in a bath or take a shower.  Think about a peaceful place.  What are some ways I can prevent weight gain? Be aware that many people gain weight after they quit smoking. However, not everyone does. To keep from gaining weight, have a plan in place before you quit and stick to the plan after you quit. Your plan should include:  Having healthy snacks. When you have a craving, it may help to: ? Eat plain popcorn, crunchy carrots, celery, or other cut vegetables. ? Chew sugar-free gum.  Changing how you eat: ? Eat small portion sizes at meals. ? Eat 4-6 small  meals throughout the day instead of 1-2 large meals a day. ? Be mindful when you eat. Do not watch television or do other things that might distract you as you eat.  Exercising regularly: ? Make time to exercise each day. If you do not have time for a long workout, do short bouts of exercise  for 5-10 minutes several times a day. ? Do some form of strengthening exercise, like weight lifting, and some form of aerobic exercise, like running or swimming.  Drinking plenty of water or other low-calorie or no-calorie drinks. Drink 6-8 glasses of water daily, or as much as instructed by your health care provider.  Summary  Quitting smoking is a physical and mental challenge. You will face cravings, withdrawal symptoms, and temptation to smoke again. Preparation can help you as you go through these challenges.  You can cope with cravings by keeping your mouth busy (such as by chewing gum), keeping your body and hands busy, and making calls to family, friends, or a helpline for people who want to quit smoking.  You can cope with withdrawal symptoms by avoiding places where people smoke, avoiding drinks with caffeine, and getting plenty of rest.  Ask your health care provider about the different ways to prevent weight gain, avoid stress, and handle social situations. This information is not intended to replace advice given to you by your health care provider. Make sure you discuss any questions you have with your health care provider. Document Released: 09/04/2016 Document Revised: 09/04/2016 Document Reviewed: 09/04/2016 Elsevier Interactive Patient Education  2018 [place] for Gastroesophageal Reflux Disease, Adult When you have gastroesophageal reflux disease (GERD), the foods you eat and your eating habits are very important. Choosing the right foods can help ease your discomfort. What guidelines do I need to follow?  Choose fruits, vegetables, whole grains, and low-fat dairy  products.  Choose low-fat meat, fish, and poultry.  Limit fats such as oils, salad dressings, butter, nuts, and avocado.  Keep a food diary. This helps you identify foods that cause symptoms.  Avoid foods that cause symptoms. These may be different for everyone.  Eat small meals often instead of 3 large meals a day.  Eat your meals slowly, in a place where you are relaxed.  Limit fried foods.  Cook foods using methods other than frying.  Avoid drinking alcohol.  Avoid drinking large amounts of liquids with your meals.  Avoid bending over or lying down until 2-3 hours after eating. What foods are not recommended? These are some foods and drinks that may make your symptoms worse: Vegetables Tomatoes. Tomato juice. Tomato and spaghetti sauce. Chili peppers. Onion and garlic. Horseradish. Fruits Oranges, grapefruit, and lemon (fruit and juice). Meats High-fat meats, fish, and poultry. This includes hot dogs, ribs, ham, sausage, salami, and bacon. Dairy Whole milk and chocolate milk. Sour cream. Cream. Butter. Ice cream. Cream cheese. Drinks Coffee and tea. Bubbly (carbonated) drinks or energy drinks. Condiments Hot sauce. Barbecue sauce. Sweets/Desserts Chocolate and cocoa. Donuts. Peppermint and spearmint. Fats and Oils High-fat foods. This includes [place] fries and potato chips. Other Vinegar. Strong spices. This includes black pepper, white pepper, red pepper, cayenne, curry powder, cloves, ginger, and chili powder. The items listed above may not be a complete list of foods and drinks to avoid. Contact your dietitian for more information. This information is not intended to replace advice given to you by your health care provider. Make sure you discuss any questions you have with your health care provider. Document Released: 03/08/2012 Document Revised: 02/13/2016 Document Reviewed: 07/12/2013 Elsevier Interactive Patient Education  2017 [employer].    How to [place]  the Smoking Habit   Why should I quit smoking?  Quitting smoking is the most important thing you can do for your health. Smoking can cause cancer, lung disease,  heart disease, and many other health problems. Secondhand smoke can be dangerous too. It can cause lung cancer and heart disease in adults. It can make asthma worse or cause ear infections in kids.   You'll see benefits as soon as you quit smoking. Your heart rate and blood pressure will go down. You'll breathe easier. It will be easier to exercise. Your sense of smell and taste will be better. You'll lower your risk of cancer, lung disease, and heart disease. You'll even live longer!   Why is it so hard to quit smoking?  Nicotine is a strong drug. Your body becomes addicted to nicotine when you smoke. You may have withdrawal symptoms or cravings when you stop smoking. You may become anxious or irritable. You might have trouble sleeping or want to eat more. These symptoms are usually worst the first week after quitting. The good news is nicotine withdrawal symptoms only last a few weeks for most people.  The routines and habits that go along with smoking can make it tough to quit too. Some people often smoke a cigarette when they drive, after a meal, or when they're on the phone. Smoking can become a part of these routines. After you quit smoking these habits can be a trigger to make you want to smoke again. It's important to separate smoking from these routines when you quit.  How can I make it easier to quit?  You don't have to quit "cold [place]." You can double or triple the chance that you'll stop smoking if you use a medicine and counseling together. There are many medicines available. These medicines work in different ways to help manage nicotine withdrawal. Many can be bought off the shelves at your local pharmacy. Some require a prescription. Talk to your pharmacist or prescriber about what medicines may be right for you.  It is  very important to have counseling when you quit. Medicines can help you cope with nicotine withdrawal. Counseling can help you develop skills to break smoking habits. There are lots of counseling options available. Many of these are free. Some options are local support groups, telephone quitlines, online services, and texting programs.   Start thinking now about how you plan to quit. Think about why you want to quit. Look at triggers that make you want to smoke. Plan for challenges you might face when trying to quit. Talk to your pharmacist about how to get help.   Where can I learn more?  Toll-free Quitlines and Websites:  In the [place] 1-800-QUIT-NOW(1-615-741-3252); http://smokefree.gov    These websites include online support, live chat, and text messaging programs.

## 2017-06-17 LAB — CBC WITH DIFFERENTIAL/PLATELET
BASOS ABS: 0.1 10*3/uL (ref 0.0–0.1)
Basophils Relative: 1.3 % (ref 0.0–3.0)
Eosinophils Absolute: 0.2 10*3/uL (ref 0.0–0.7)
Eosinophils Relative: 2.2 % (ref 0.0–5.0)
HCT: 40.4 % (ref 39.0–52.0)
HEMOGLOBIN: 13.9 g/dL (ref 13.0–17.0)
LYMPHS ABS: 3.2 10*3/uL (ref 0.7–4.0)
Lymphocytes Relative: 45 % (ref 12.0–46.0)
MCHC: 34.3 g/dL (ref 30.0–36.0)
MCV: 90.5 fl (ref 78.0–100.0)
Monocytes Absolute: 1.2 10*3/uL — ABNORMAL HIGH (ref 0.1–1.0)
Monocytes Relative: 16.6 % — ABNORMAL HIGH (ref 3.0–12.0)
Neutro Abs: 2.5 10*3/uL (ref 1.4–7.7)
Neutrophils Relative %: 34.9 % — ABNORMAL LOW (ref 43.0–77.0)
Platelets: 146 10*3/uL — ABNORMAL LOW (ref 150.0–400.0)
RBC: 4.47 Mil/uL (ref 4.22–5.81)
RDW: 14.4 % (ref 11.5–15.5)
WBC: 7.1 10*3/uL (ref 4.0–10.5)

## 2017-06-17 LAB — COMPREHENSIVE METABOLIC PANEL
ALT: 18 U/L (ref 0–53)
AST: 20 U/L (ref 0–37)
Albumin: 4.2 g/dL (ref 3.5–5.2)
Alkaline Phosphatase: 68 U/L (ref 39–117)
BUN: 13 mg/dL (ref 6–23)
CHLORIDE: 107 meq/L (ref 96–112)
CO2: 27 mEq/L (ref 19–32)
Calcium: 9.6 mg/dL (ref 8.4–10.5)
Creatinine, Ser: 0.98 mg/dL (ref 0.40–1.50)
GFR: 100.72 mL/min (ref >60.00)
Glucose, Bld: 81 mg/dL (ref 70–99)
Potassium: 4.1 mEq/L (ref 3.5–5.1)
SODIUM: 140 meq/L (ref 135–145)
TOTAL PROTEIN: 7.1 g/dL (ref 6.0–8.3)
Total Bilirubin: 0.5 mg/dL (ref 0.2–1.2)

## 2017-06-17 LAB — LIPID PANEL
Cholesterol: 162 mg/dL (ref 0–200)
HDL: 47.9 mg/dL (ref >39.00)
LDL Cholesterol: 89 mg/dL (ref 0–99)
NONHDL: 114.28
Total CHOL/HDL Ratio: 3
Triglycerides: 128 mg/dL (ref 0.0–149.0)
VLDL: 25.6 mg/dL (ref 0.0–40.0)

## 2017-06-17 LAB — PSA: PSA: 1.42 ng/mL (ref 0.10–4.00)

## 2017-06-17 LAB — HEMOGLOBIN A1C: Hgb A1c MFr Bld: 5.3 % (ref 4.6–6.5)

## 2017-06-22 ENCOUNTER — Ambulatory Visit (INDEPENDENT_AMBULATORY_CARE_PROVIDER_SITE_OTHER)
Admission: RE | Admit: 2017-06-22 | Discharge: 2017-06-22 | Disposition: A | Discharge status: Home / Self Care | Payer: Commercial Managed Care - PPO | Source: Ambulatory Visit | Attending: Family Medicine | Admitting: Family Medicine

## 2017-06-22 DIAGNOSIS — M7541 Impingement syndrome of right shoulder: Secondary | ICD-10-CM

## 2017-06-22 DIAGNOSIS — M7542 Impingement syndrome of left shoulder: Secondary | ICD-10-CM

## 2017-06-22 DIAGNOSIS — M25511 Pain in right shoulder: Secondary | ICD-10-CM

## 2017-06-22 DIAGNOSIS — M25512 Pain in left shoulder: Secondary | ICD-10-CM

## 2017-06-28 ENCOUNTER — Other Ambulatory Visit: Payer: Self-pay | Admitting: Emergency Medicine

## 2017-06-28 DIAGNOSIS — M1990 Unspecified osteoarthritis, unspecified site: Secondary | ICD-10-CM

## 2017-07-02 ENCOUNTER — Ambulatory Visit: Payer: Commercial Managed Care - PPO | Admitting: Family Medicine

## 2017-07-09 ENCOUNTER — Encounter: Payer: Self-pay | Admitting: Family Medicine

## 2017-07-09 ENCOUNTER — Ambulatory Visit (INDEPENDENT_AMBULATORY_CARE_PROVIDER_SITE_OTHER): Payer: Commercial Managed Care - PPO | Admitting: Family Medicine

## 2017-07-09 VITALS — BP 120/70 | HR 74 | Temp 98.1°F

## 2017-07-09 DIAGNOSIS — Z720 Tobacco use: Secondary | ICD-10-CM

## 2017-07-09 DIAGNOSIS — L0291 Cutaneous abscess, unspecified: Secondary | ICD-10-CM | POA: Diagnosis not present

## 2017-07-09 MED ORDER — SULFAMETHOXAZOLE-TRIMETHOPRIM 800-160 MG PO TABS: 1.0000 | ORAL_TABLET | Freq: Two times a day (BID) | ORAL | 0 refills | Status: DC

## 2017-07-09 NOTE — Progress Notes (Signed)
Subjective:    Patient ID: Joshua Stephenson, male    DOB: 07-Jan-1956, 61 y.o.   MRN: [id number]  No chief complaint on file.   HPI Patient was seen today for f/u on tobacco use and for a bump under armpit.  Pt is accompanied by his wife.  Pt's wife encouraged him to be seen, as he does not like coming to the doctor/needles.  Pt does have an upcoming sports med appt for possible shoulder injections.  Smoking: -pt quit 1 wk after last OFV -Has been using nicotine gum daily. -Had a day last wk where he smoked 1 cigarette but continued to chew the gum. -Pt's daughter has been inspired to quit smoking too.  Bump under L armpit, acute: -Has been there x 1 wk -was sore, now not painful -has decreased in size. -pt has not done anything for the bump   Past Medical History:  Diagnosis Date  . Anxiety   . Asthma    as a child  . 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 GERD (gastroesophageal reflux disease)   . Hemorrhoids     No Known Allergies  ROS General: Denies fever, chills, night sweats, changes in weight, changes in appetite HEENT: Denies headaches, ear pain, changes in vision, rhinorrhea, sore throat CV: Denies CP, palpitations, SOB, orthopnea Pulm: Denies SOB, cough, wheezing GI: Denies abdominal pain, nausea, vomiting, diarrhea, constipation GU: Denies dysuria, hematuria, frequency, vaginal discharge Msk: Denies muscle cramps, joint pains Neuro: Denies weakness, numbness, tingling Skin: Denies rashes, bruising   +bump underneath L armpit. Psych: Denies depression, anxiety, hallucinations     Objective:    Blood pressure 120/70, pulse 74, temperature 98.1 F (36.7 C), temperature source Oral.   Gen. Pleasant, well-nourished, in no distress, normal affect  HEENT: [place]/AT, face symmetric, conjunctiva clear, no scleral icterus, PERRLA, EOMI, nares patent without drainage, pharynx without erythema or exudate. Lungs: no accessory muscle use, CTAB, no wheezes or  rales Cardiovascular: RRR, no m/r/g, no peripheral edema Abdomen: BS present, soft, NT/ND, no hepatosplenomegaly. Musculoskeletal: No deformities, no cyanosis or clubbing, normal tone Neuro:  A&Ox3, CN II-XII intact, normal gait Skin:  Warm, dry intact.  L armpit with small <1 cm abscess, no erythema or induration of surrounding skin.  Needle inserted into abscess with minimal drainage noted.   Wt Readings from Last 3 Encounters:  06/16/17 154 lb 3.2 oz (69.9 kg)  06/07/14 163 lb (73.9 kg)  09/18/12 180 lb 1.6 oz (81.7 kg)    Lab Results  Component Value Date   WBC 7.1 06/16/2017   HGB 13.9 06/16/2017   HCT 40.4 06/16/2017   PLT (L) 06/16/2017    146.0 Result may be falsely decreased due to platelet clumping.   GLUCOSE 81 06/16/2017   CHOL 162 06/16/2017   TRIG 128.0 06/16/2017   HDL 47.90 06/16/2017   LDLCALC 89 06/16/2017   ALT 18 06/16/2017   AST 20 06/16/2017   NA 140 06/16/2017   K 4.1 06/16/2017   CL 107 06/16/2017   CREATININE 0.98 06/16/2017   BUN 13 06/16/2017   CO2 27 06/16/2017   TSH 1.208 09/18/2012   PSA 1.42 06/16/2017   HGBA1C 5.3 06/16/2017    Assessment/Plan:  Abscess  -Drainage attempted with minimal return -Pt given RTC/ED  Precautions -If needed can take Ibuprofen or Tylenol for pain/discomfort. -will give abx - Plan: sulfamethoxazole-trimethoprim (BACTRIM DS) 800-160 MG tablet  Tobacco use -Pt congratulated on progress. -Encouraged to continue using the gum and try not  to smoke cigarettes.   F/u prn.   Incision and Drainage Procedure Note  Pre-operative Diagnosis: abscess  Post-operative Diagnosis: same  Anesthesia: not needed  Procedure Details  The procedure, risks and complications have been discussed in detail (including, but not limited to airway compromise, infection, bleeding) with the patient, and the patient has signed consent to the procedure.  The skin was sterilely prepped and draped over the affected area in the  usual fashion. A syringe with a 25 G needle was inserted into the area.  Little to no drainage withdrawn from area.

## 2017-07-09 NOTE — Patient Instructions (Addendum)
Incision and Drainage Incision and drainage is a surgical procedure to open and drain a fluid-filled sac. The sac may be filled with pus, mucus, or blood. Examples of fluid-filled sacs that may need surgical drainage include cysts, skin infections (abscesses), and red lumps that develop from a ruptured cyst or a small abscess (boils). You may need this procedure if the affected area is large, painful, infected, or not healing well. Tell a health care provider about:  Any allergies you have.  All medicines you are taking, including vitamins, herbs, eye drops, creams, and over-the-counter medicines.  Any problems you or family members have had with anesthetic medicines.  Any blood disorders you have.  Any surgeries you have had.  Any medical conditions you have.  Whether you are pregnant or may be pregnant. What are the risks? Generally, this is a safe procedure. However, problems may occur, including:  Infection.  Bleeding.  Allergic reactions to medicines.  Scarring.  What happens before the procedure?  You may need an ultrasound or other imaging tests to see how large or deep the fluid-filled sac is.  You may have blood tests to check for infection.  You may get a tetanus shot.  You may be given antibiotic medicine to help prevent infection.  Follow instructions from your health care provider about eating or drinking restrictions.  Ask your health care provider about: ? Changing or stopping your regular medicines. This is especially important if you are taking diabetes medicines or blood thinners. ? Taking medicines such as aspirin and ibuprofen. These medicines can thin your blood. Do not take these medicines before your procedure if your health care provider instructs you not to.  Plan to have someone take you home after the procedure.  If you will be going home right after the procedure, plan to have someone stay with you for 24 hours. What happens during the  procedure?  To reduce your risk of infection: ? Your health care team will wash or sanitize their hands. ? Your skin will be washed with soap.  You will be given one or more of the following: ? A medicine to help you relax (sedative). ? A medicine to numb the area (local anesthetic). ? A medicine to make you fall asleep (general anesthetic).  An incision will be made in the top of the fluid-filled sac.  The contents of the sac may be squeezed out, or a syringe or tube (catheter)may be used to empty the sac.  The catheter may be left in place for several weeks to drain any fluid. Or, your health care provider may stitch open the edges of the incision to make a long-term opening for drainage (marsupialization).  The inside of the sac may be washed out (irrigated) with a sterile solution and packed with gauze before it is covered with a bandage (dressing). The procedure may vary among health care providers and hospitals. What happens after the procedure?  Your blood pressure, heart rate, breathing rate, and blood oxygen level will be monitored often until the medicines you were given have worn off.  Do not drive for 24 hours if you received a sedative. This information is not intended to replace advice given to you by your health care provider. Make sure you discuss any questions you have with your health care provider. Document Released: 03/03/2001 Document Revised: 02/13/2016 Document Reviewed: 06/28/2015 Elsevier Interactive Patient Education  2017 [employer]vier Inc.  

## 2017-07-21 ENCOUNTER — Ambulatory Visit: Payer: Commercial Managed Care - PPO | Admitting: Family Medicine

## 2017-08-17 ENCOUNTER — Encounter: Payer: Self-pay | Admitting: Family Medicine

## 2017-08-17 ENCOUNTER — Ambulatory Visit (INDEPENDENT_AMBULATORY_CARE_PROVIDER_SITE_OTHER): Payer: Commercial Managed Care - PPO | Admitting: Family Medicine

## 2017-08-17 VITALS — BP 128/82 | Ht 67.0 in | Wt 164.0 lb

## 2017-08-17 DIAGNOSIS — M25511 Pain in right shoulder: Secondary | ICD-10-CM

## 2017-08-17 DIAGNOSIS — F17200 Nicotine dependence, unspecified, uncomplicated: Secondary | ICD-10-CM

## 2017-08-17 DIAGNOSIS — G8929 Other chronic pain: Secondary | ICD-10-CM | POA: Diagnosis not present

## 2017-08-17 MED ORDER — VARENICLINE TARTRATE 0.5 MG X 11 & 1 MG X 42 PO MISC: ORAL | 0 refills | Status: DC

## 2017-08-17 MED ORDER — TRAMADOL HCL 50 MG PO TABS: ORAL_TABLET | ORAL | 0 refills | Status: DC

## 2017-08-17 NOTE — Patient Instructions (Signed)
We have scheduled an appt for you to see Dr. Berenice Primas at [place] [phone]  Appointment: 08/26/2017 at 2:45 pm, arrive at 2:15 pm for paperwork. Please bring photo ID and insurance card with you.

## 2017-08-19 NOTE — Progress Notes (Signed)
  Lacy Taglieri Cid - 61 y.o. male MRN [id number]  Date of birth: 1956-08-21    SUBJECTIVE:      Chief Complaint:/ HPI:  Bilateral shoulder pain ongoing for many many months. In the last 2-4 months it has gotten significantly worse. The right shoulder is more painful than the left. He is right-hand dominant. 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 Is also having pain at night. Denies numbness or tingling in his hands. Shoulder pain is 8-9 out of 10 most of the time. Any movement of the arm increases pain but particularly male is across his body or above shoulder height. Pain is aching in nature. Occasionally sharp. Nothing seems to make it better.   ROS:     No unusual weight change, no fever. No other unusual arthralgias or myalgias. Has had no rash. Energy level has been stable.  PERTINENT  PMH / PSH FH / / SH:  Past Medical, Surgical, Social, and Family History Reviewed & Updated in the EMR.  Pertinent findings include:  No prior history of shoulder injury or shoulder surgery Current smoker, would like to quit History of COPD History of GERD Is chart review reveals history of heart disease but he reports not currently taking any medications for that and says he doesn't really have any heart issues. Does not look like he's ever had a CABG or echo. Last EKG was in 2015. Normal. Married is here with his wife today Family history of diabetes and coronary artery disease  OBJECTIVE: BP 128/82   Ht 5\' 7"  (1.702 m)   Wt 164 lb (74.4 kg)   BMI 25.69 kg/m   Physical Exam:  Vital signs are reviewed. GEN.: Well-developed male no acute distress SHOULDERS: Enlarged acromioclavicular joint area bilaterally right greater than left. It is tender to palpation particularly on the right. He has full range of motion of both shoulders in all planes the rotator cuff and intact strength although he has significant pain with crossover testing,  supraspinatus testing and internal rotation. VASCULAR: Radial pulses 2+ bilaterally symmetrical  NEURO: Intact sensation to soft touch bilateral hands PSYCH: Alert and oriented 4. Affects interactive. Speech is normal and fluency in content. He asks and answers questions appropriately.  IMAGING: Right left shoulder x-ray from 06/22/2017. No shoulders reveal significant arthritic change and bony overgrowth of the acromioclavicular joint. 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 The glenohumeral joint space appears to be well maintained.  ASSESSMENT & PLAN:  1.Bilateral shoulder pain. I suspect majority of this is from his acromioclavicular joint disease. Think she would benefit from surgical evaluation and have set that up. I did give him some tramadol for pain management until healing get into see the orthopedic physician. I gave him precautions that he should not drive under the influence of this medicine, not operate heavy equipment etc.  #2. Current smoker, he would like to quit. He is primary care provider had given him some nicotine gum which seemed to help. We discussed smoking in the setting of potential surgery in the near future. He asked for a prescription for Chantix which I gave him. He can follow-up smoking cessation with his PCP.

## 2017-09-26 ENCOUNTER — Other Ambulatory Visit: Payer: Self-pay | Admitting: Family Medicine

## 2017-09-28 ENCOUNTER — Other Ambulatory Visit: Payer: Self-pay | Admitting: *Deleted

## 2017-09-28 MED ORDER — VARENICLINE TARTRATE 0.5 MG X 11 & 1 MG X 42 PO MISC: ORAL | 0 refills | Status: DC

## 2017-10-04 ENCOUNTER — Encounter: Payer: Self-pay | Admitting: Family Medicine

## 2017-10-04 ENCOUNTER — Telehealth: Payer: Self-pay | Admitting: Emergency Medicine

## 2017-10-04 ENCOUNTER — Ambulatory Visit: Payer: Commercial Managed Care - PPO | Admitting: Family Medicine

## 2017-10-04 VITALS — BP 118/78 | HR 65 | Temp 98.7°F | Wt 151.5 lb

## 2017-10-04 DIAGNOSIS — Z1211 Encounter for screening for malignant neoplasm of colon: Secondary | ICD-10-CM

## 2017-10-04 DIAGNOSIS — R112 Nausea with vomiting, unspecified: Secondary | ICD-10-CM | POA: Diagnosis not present

## 2017-10-04 DIAGNOSIS — R1013 Epigastric pain: Secondary | ICD-10-CM

## 2017-10-04 MED ORDER — ONDANSETRON HCL 4 MG PO TABS: 4.0000 mg | ORAL_TABLET | Freq: Three times a day (TID) | ORAL | 0 refills | Status: DC | PRN

## 2017-10-04 NOTE — Patient Instructions (Addendum)
Abdominal Pain, Adult Many things can cause belly (abdominal) pain. Most times, belly pain is not dangerous. Many cases of belly pain can be watched and treated at home. Sometimes belly pain is serious, though. Your doctor will try to find the cause of your belly pain. Follow these instructions at home:  Take over-the-counter and prescription medicines only as told by your doctor. Do not take medicines that help you poop (laxatives) unless told to by your doctor.  Drink enough fluid to keep your pee (urine) clear or pale yellow.  Watch your belly pain for any changes.  Keep all follow-up visits as told by your doctor. This is important. Contact a doctor if:  Your belly pain changes or gets worse.  You are not hungry, or you lose weight without trying.  You are having trouble pooping (constipated) or have watery poop (diarrhea) for more than 2-3 days.  You have pain when you pee or poop.  Your belly pain wakes you up at night.  Your pain gets worse with meals, after eating, or with certain foods.  You are throwing up and cannot keep anything down.  You have a fever. Get help right away if:  Your pain does not go away as soon as your doctor says it should.  You cannot stop throwing up.  Your pain is only in areas of your belly, such as the right side or the left lower part of the belly.  You have bloody or black poop, or poop that looks like tar.  You have very bad pain, cramping, or bloating in your belly.  You have signs of not having enough fluid or water in your body (dehydration), such as: ? Dark pee, very little pee, or no pee. ? Cracked lips. ? Dry mouth. ? Sunken eyes. ? Sleepiness. ? Weakness. This information is not intended to replace advice given to you by your health care provider. Make sure you discuss any questions you have with your health care provider. Document Released: 02/24/2008 Document Revised: 03/27/2016 Document Reviewed: 02/19/2016 Elsevier  Interactive Patient Education  2018 [employer].  Colonoscopy, Adult A colonoscopy is an exam to look at the large intestine. It is done to check for problems, such as:  Lumps (tumors).  Growths (polyps).  Swelling (inflammation).  Bleeding.  What happens before the procedure? Eating and drinking Follow instructions from your doctor about eating and drinking. These instructions may include:  A few days before the procedure - follow a low-fiber diet. ? Avoid nuts. ? Avoid seeds. ? Avoid dried fruit. ? Avoid raw fruits. ? Avoid vegetables.  1-3 days before the procedure - follow a clear liquid diet. Avoid liquids that have red or purple dye. Drink only clear liquids, such as: ? Clear broth or bouillon. ? Black coffee or tea. ? Clear juice. ? Clear soft drinks or sports drinks. ? Gelatin dessert. ? Popsicles.  On the day of the procedure - do not eat or drink anything during the 2 hours before the procedure.  Bowel prep If you were prescribed an oral bowel prep:  Take it as told by your doctor. Starting the day before your procedure, you will need to drink a lot of liquid. The liquid will cause you to poop (have bowel movements) until your poop is almost clear or light green.  If your skin or butt gets irritated from diarrhea, you may: ? Wipe the area with wipes that have medicine in them, such as adult wet wipes with aloe and  vitamin E. ? Put something on your skin that soothes the area, such as petroleum jelly.  If you throw up (vomit) while drinking the bowel prep, take a break for up to 60 minutes. Then begin the bowel prep again. If you keep throwing up and you cannot take the bowel prep without throwing up, call your doctor.  General instructions  Ask your doctor about changing or stopping your normal medicines. This is important if you take diabetes medicines or blood thinners.  Plan to have someone take you home from the hospital or clinic. What happens during  the procedure?  An IV tube may be put into one of your veins.  You will be given medicine to help you relax (sedative).  To reduce your risk of infection: ? Your doctors will wash their hands. ? Your anal area will be washed with soap.  You will be asked to lie on your side with your knees bent.  Your doctor will get a long, thin, flexible tube ready. The tube will have a camera and a light on the end.  The tube will be put into your anus.  The tube will be gently put into your large intestine.  Air will be delivered into your large intestine to keep it open. You may feel some pressure or cramping.  The camera will be used to take photos.  A small tissue sample may be removed from your body to be looked at under a microscope (biopsy). If any possible problems are found, the tissue will be sent to a lab for testing.  If small growths are found, your doctor may remove them and have them checked for cancer.  The tube that was put into your anus will be slowly removed. The procedure may vary among doctors and hospitals. What happens after the procedure?  Your doctor will check on you often until the medicines you were given have worn off.  Do not drive for 24 hours after the procedure.  You may have a small amount of blood in your poop.  You may pass gas.  You may have mild cramps or bloating in your belly (abdomen).  It is up to you to get the results of your procedure. Ask your doctor, or the department performing the procedure, when your results will be ready. This information is not intended to replace advice given to you by your health care provider. Make sure you discuss any questions you have with your health care provider. Document Released: 10/10/2010 Document Revised: 07/08/2016 Document Reviewed: 11/19/2015 Elsevier Interactive Patient Education  2017 [employer].

## 2017-10-04 NOTE — Telephone Encounter (Signed)
Patient is aware of appointment tomorrow with [facility] at 9:25am. Patient was given number and address and also instructions to remain NPO after midnight. Nothing further needed.

## 2017-10-04 NOTE — Progress Notes (Signed)
Subjective:    Patient ID: Joshua Stephenson, male    DOB: 06-30-1956, 62 y.o.   MRN: [id number]  No chief complaint on file. Patient is accompanied by his wife.  HPI Patient was seen today for acute concern. Abdominal pain, indigestion, n/v, decreased appetite times several months.  Patient endorses stomach cramps, malodorous gas, vomiting noted with all foods.  Patient states that he eats something it may be 15 minutes before it comes back up.  He has been taking Nexium but states it is not working.  Patient endorses vomiting with fluids as well.  He denies fever, headaches, chills, chest pain, constipation, diarrhea.  Past Medical History:  Diagnosis Date  . Anxiety   . Asthma    as a child  . COPD, mild ([place]) 06/07/2014  . GERD (gastroesophageal reflux disease)   . Hemorrhoids     No Known Allergies  ROS General: Denies fever, chills, night sweats, changes in appetite  + weight loss HEENT: Denies headaches, ear pain, changes in vision, rhinorrhea, sore throat CV: Denies CP, palpitations, SOB, orthopnea Pulm: Denies SOB, cough, wheezing GI: Denies diarrhea, constipation  + abdominal pain/cramps, nausea, vomiting, indigestion GU: Denies dysuria, hematuria, frequency, vaginal discharge Msk: Denies muscle cramps, joint pains Neuro: Denies weakness, numbness, tingling Skin: Denies rashes, bruising Psych: Denies depression, anxiety, hallucinations     Objective:    Blood pressure 118/78, pulse 65, temperature 98.7 F (37.1 C), temperature source Oral, weight 151 lb 8 oz (68.7 kg).   Gen. Pleasant, well-nourished, in no distress, normal affect  HEENT: [place]/AT, face symmetric, no scleral icterus, PERRLA, nares patent without drainage, pharynx without erythema or exudate. Lungs: no accessory muscle use, CTAB, no wheezes or rales Cardiovascular: RRR, no m/r/g, no peripheral edema Abdomen: BS present, soft, ND, diffuse TTP, TTP in LUQ> RUQ, liver edge 1 cm below costal  margin Musculoskeletal: No deformities, no cyanosis or clubbing, normal tone Neuro:  A&Ox3, CN II-XII intact, normal gait Skin:  Warm, no lesions/ rash   Wt Readings from Last 3 Encounters:  10/04/17 151 lb 8 oz (68.7 kg)  08/17/17 164 lb (74.4 kg)  06/16/17 154 lb 3.2 oz (69.9 kg)    Lab Results  Component Value Date   WBC 7.1 06/16/2017   HGB 13.9 06/16/2017   HCT 40.4 06/16/2017   PLT (L) 06/16/2017    146.0 Result may be falsely decreased due to platelet clumping.   GLUCOSE 81 06/16/2017   CHOL 162 06/16/2017   TRIG 128.0 06/16/2017   HDL 47.90 06/16/2017   LDLCALC 89 06/16/2017   ALT 18 06/16/2017   AST 20 06/16/2017   NA 140 06/16/2017   K 4.1 06/16/2017   CL 107 06/16/2017   CREATININE 0.98 06/16/2017   BUN 13 06/16/2017   CO2 27 06/16/2017   TSH 1.208 09/18/2012   PSA 1.42 06/16/2017   HGBA1C 5.3 06/16/2017    Assessment/Plan:   Pt is a 62 yo pw ongoing abd pain, N/V, indigestion x several months with a 13 lb wt loss.  Pt does not like coming to the doctor, so for him to be here today is significant.    Epigastric pain  -Given 13 lb wt loss since Nov. 2018 will place referral to GI for EGD and Colonoscopy. -continue Nexium. - Plan: Ambulatory referral to Gastroenterology, Comprehensive metabolic panel, Lipase, CBC with Differential/Platelet, US Abdomen Limited RUQ  Screen for colon cancer  -In the past patient has been hesitant to have colonoscopy, but is amenable  to the plan this visit. - Plan: Ambulatory referral to Gastroenterology  Non-intractable vomiting with nausea, unspecified vomiting type  -Sipping fluids and taking small bites of food first. - Plan: ondansetron (ZOFRAN) 4 MG tablet, US Abdomen Limited RUQ   F/u prn in the next few wks if symptoms become worse.  Grier Mitts, MD

## 2017-10-05 ENCOUNTER — Ambulatory Visit
Admission: RE | Admit: 2017-10-05 | Discharge: 2017-10-05 | Disposition: A | Discharge status: Home / Self Care | Payer: Commercial Managed Care - PPO | Source: Ambulatory Visit | Attending: Family Medicine | Admitting: Family Medicine

## 2017-10-05 DIAGNOSIS — R112 Nausea with vomiting, unspecified: Secondary | ICD-10-CM

## 2017-10-05 DIAGNOSIS — R1013 Epigastric pain: Secondary | ICD-10-CM

## 2017-10-06 ENCOUNTER — Other Ambulatory Visit: Payer: Commercial Managed Care - PPO

## 2017-10-06 LAB — COMPREHENSIVE METABOLIC PANEL
ALT: 9 U/L (ref 0–53)
AST: 16 U/L (ref 0–37)
Albumin: 4.1 g/dL (ref 3.5–5.2)
Alkaline Phosphatase: 54 U/L (ref 39–117)
BUN: 20 mg/dL (ref 6–23)
CO2: 30 mEq/L (ref 19–32)
Calcium: 9.2 mg/dL (ref 8.4–10.5)
Chloride: 105 mEq/L (ref 96–112)
Creatinine, Ser: 0.96 mg/dL (ref 0.40–1.50)
GFR: 103.04 mL/min (ref >60.00)
Glucose, Bld: 89 mg/dL (ref 70–99)
Potassium: 4.1 mEq/L (ref 3.5–5.1)
Sodium: 141 mEq/L (ref 135–145)
Total Bilirubin: 0.6 mg/dL (ref 0.2–1.2)
Total Protein: 6.8 g/dL (ref 6.0–8.3)

## 2017-10-06 LAB — CBC WITH DIFFERENTIAL/PLATELET
Basophils Absolute: 0 10*3/uL (ref 0.0–0.1)
Basophils Relative: 0.3 % (ref 0.0–3.0)
Eosinophils Absolute: 0.1 10*3/uL (ref 0.0–0.7)
Eosinophils Relative: 1.7 % (ref 0.0–5.0)
HCT: 39.6 % (ref 39.0–52.0)
Hemoglobin: 13.8 g/dL (ref 13.0–17.0)
Lymphocytes Relative: 44.5 % (ref 12.0–46.0)
Lymphs Abs: 2.2 10*3/uL (ref 0.7–4.0)
MCHC: 34.7 g/dL (ref 30.0–36.0)
MCV: 89.8 fl (ref 78.0–100.0)
Monocytes Absolute: 0.8 10*3/uL (ref 0.1–1.0)
Monocytes Relative: 15.6 % — ABNORMAL HIGH (ref 3.0–12.0)
Neutro Abs: 1.9 10*3/uL (ref 1.4–7.7)
Neutrophils Relative %: 37.9 % — ABNORMAL LOW (ref 43.0–77.0)
Platelets: 236 10*3/uL (ref 150.0–400.0)
RBC: 4.41 Mil/uL (ref 4.22–5.81)
RDW: 13.8 % (ref 11.5–15.5)
WBC: 4.9 10*3/uL (ref 4.0–10.5)

## 2017-10-06 LAB — LIPASE: Lipase: 10 U/L — ABNORMAL LOW (ref 11.0–59.0)

## 2017-10-18 ENCOUNTER — Ambulatory Visit: Payer: Commercial Managed Care - PPO | Admitting: Internal Medicine

## 2017-10-18 ENCOUNTER — Telehealth: Payer: Self-pay | Admitting: Family Medicine

## 2017-10-18 ENCOUNTER — Telehealth: Payer: Self-pay | Admitting: Internal Medicine

## 2017-10-18 NOTE — Telephone Encounter (Unsigned)
Copied from [place]. Topic: General - Other >> Oct 18, 2017 12:01 PM Neva Seat wrote: Pt needing information on Xrays that was recently done.  He states the shoulder Dr. Is needing this to schedule his surgery.  He says he can come by to pick it up asap.

## 2017-10-18 NOTE — Telephone Encounter (Signed)
No charge. 

## 2017-10-18 NOTE — Telephone Encounter (Signed)
Pt is requesting xray report from bilateral shoulders done 06/2017. He is coming to pick these up tomorrow as he needs them to give to orthopedic surgeon.

## 2017-10-19 NOTE — Telephone Encounter (Signed)
Left a VM for patient regarding patient needing xray reports they are waiting up front to be picked up

## 2017-10-20 NOTE — Telephone Encounter (Signed)
Relation to pt: self Call back number: [phone]   Reason for call:  patient unable to pick up report and would like report faxed to [facility]ention [facility] fax # [phone]

## 2017-10-20 NOTE — Telephone Encounter (Signed)
Notes have been faxed successfully.

## 2017-11-29 ENCOUNTER — Ambulatory Visit: Payer: Commercial Managed Care - PPO | Admitting: Internal Medicine

## 2017-12-13 ENCOUNTER — Telehealth: Payer: Self-pay | Admitting: Family Medicine

## 2017-12-13 NOTE — Telephone Encounter (Signed)
What surgery is patient having done? And what follow ups would he need?

## 2017-12-13 NOTE — Telephone Encounter (Signed)
Please see below message, please advise.

## 2017-12-13 NOTE — Telephone Encounter (Signed)
Copied from [place]. Topic: General - Other >> Dec 13, 2017  3:12 PM Marin Olp L wrote: Reason for CRM: Patient needs a call back to discuss setting up appointments with Dr. Volanda Napoleon after his upcoming surgery. The office where he is doing the surgery needs this done before he has the procedure. Wife requested a call back from Dr. Volanda Napoleon.

## 2017-12-14 NOTE — Telephone Encounter (Signed)
Left message on voicemail to call office.  

## 2017-12-14 NOTE — Telephone Encounter (Signed)
Joshua Stephenson 12/14/2017 10:50 AM  Please call Patient's wife Helene Kelp back @ [phone]

## 2017-12-14 NOTE — Telephone Encounter (Signed)
Pt called back to get information on a call back, pt states the physician who is performing surgery is waiting to hear back from pt after speaking w/ pcp, call pt to advise

## 2017-12-15 ENCOUNTER — Other Ambulatory Visit: Payer: Self-pay | Admitting: Family Medicine

## 2017-12-15 DIAGNOSIS — Z9889 Other specified postprocedural states: Secondary | ICD-10-CM

## 2017-12-15 NOTE — Telephone Encounter (Signed)
PT referral placed in epic. Follow-up appointment was made as well. Pt's wife was made aware.

## 2017-12-15 NOTE — Telephone Encounter (Signed)
Pt is having right shoulder sx in [place] ( job is sending him for his), pt will be in the country on the 14th of April. Pt need refer for Pt as well/ since his job is sending him for the sx they are requiring this before he leave for the surgery.

## 2017-12-17 ENCOUNTER — Ambulatory Visit: Payer: Commercial Managed Care - PPO | Admitting: Family Medicine

## 2017-12-17 DIAGNOSIS — Z0289 Encounter for other administrative examinations: Secondary | ICD-10-CM

## 2017-12-21 ENCOUNTER — Ambulatory Visit: Payer: Commercial Managed Care - PPO | Admitting: Family Medicine

## 2017-12-21 ENCOUNTER — Encounter: Payer: Self-pay | Admitting: Family Medicine

## 2017-12-21 VITALS — BP 120/62 | HR 85 | Temp 98.3°F | Wt 149.0 lb

## 2017-12-21 DIAGNOSIS — M25512 Pain in left shoulder: Secondary | ICD-10-CM | POA: Diagnosis not present

## 2017-12-21 DIAGNOSIS — M25511 Pain in right shoulder: Secondary | ICD-10-CM | POA: Diagnosis not present

## 2017-12-21 DIAGNOSIS — G8929 Other chronic pain: Secondary | ICD-10-CM | POA: Diagnosis not present

## 2017-12-21 MED ORDER — DICLOFENAC SODIUM 1 % TD GEL
2.0000 g | Freq: Three times a day (TID) | TRANSDERMAL | 3 refills | Status: DC | PRN
Start: 2017-12-21 — End: 2018-02-22

## 2017-12-21 NOTE — Progress Notes (Signed)
Subjective:    Patient ID: Joshua Stephenson, male    DOB: 11/26/1955, 62 y.o.   MRN: [id number]  Chief Complaint  Patient presents with  . Shoulder Pain    HPI Patient was seen today for ongoing concern.  Pt continues to have bilateral shoulder pain which has become worse over the last several months.  Pt initially seen by sports medicine and referred to orthopedics.  Pt recently contacted this provider stating his insurance company would pay for him to have right shoulder surgery in [place], but pt needed to have f/u appts and a referral for PT in place prior to his procedure.  Pt's wife asked to contact the office with surgeon's name that would be doing the procedure and other details.  This provider still has not received that information.  Pt now having second thoughts about having the procedure done out of the country.  Pt was scheduled to leave this weekend but has yet to receive his passport.  Pt's short term disability is to start on Friday 4/ 5.  Pt states his insurance said it would be cheaper for him to go out of the country.  If pt has procedure here he needs $1000 upfront and still may occur other cost.  Pt states it is becoming increasingly difficult to continue working given bilateral shoulder pain.  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  Pt endorses feeling tension/pain and popping in his left shoulder when at work.  Pt also has a stabbing pain in his right shoulder and neck. Tramadol 50 mg 3 times daily is not helping.  Pt now unable to lay flat on his back at night due to pain. B/l shoulder xrays done on 06/22/17 by this provider.  Pt had and MRI on his R shoulder done out of town.   Past Medical History:  Diagnosis Date  . Anxiety   . Asthma    as a child  . COPD, mild ([place]) 06/07/2014  . GERD (gastroesophageal reflux disease)   . Hemorrhoids     No Known Allergies  ROS General: Denies fever, chills, night sweats, changes in weight,  changes in appetite HEENT: Denies headaches, ear pain, changes in vision, rhinorrhea, sore throat CV: Denies CP, palpitations, SOB, orthopnea Pulm: Denies SOB, cough, wheezing GI: Denies abdominal pain, nausea, vomiting, diarrhea, constipation GU: Denies dysuria, hematuria, frequency, vaginal discharge Msk: Denies muscle cramps, joint pains  +B/l shoulder pain Neuro: Denies weakness, numbness, tingling Skin: Denies rashes, bruising Psych: Denies depression, anxiety, hallucinations     Objective:    Blood pressure 120/62, pulse 85, temperature 98.3 F (36.8 C), temperature source Oral, weight 149 lb (67.6 kg), SpO2 98 %.   Gen. Pleasant, well-nourished, in no distress, normal affect   HEENT: [place]/AT, face symmetric, no scleral icterus, PERRLA, nares patent without drainage Neck: No JVD, no thyromegaly, no carotid bruits Lungs: no accessory muscle use Cardiovascular: RRR, no peripheral edema Musculoskeletal: No deformities, no cyanosis or clubbing, normal tone Neuro:  A&Ox3, CN II-XII intact, normal gait   Wt Readings from Last 3 Encounters:  12/21/17 149 lb (67.6 kg)  10/04/17 151 lb 8 oz (68.7 kg)  08/17/17 164 lb (74.4 kg)    Lab Results  Component Value Date   WBC 4.9 10/06/2017   HGB 13.8 10/06/2017   HCT 39.6 10/06/2017   PLT 236.0 10/06/2017   GLUCOSE 89 10/06/2017   CHOL 162 06/16/2017   TRIG 128.0 06/16/2017  HDL 47.90 06/16/2017   LDLCALC 89 06/16/2017   ALT 9 10/06/2017   AST 16 10/06/2017   NA 141 10/06/2017   K 4.1 10/06/2017   CL 105 10/06/2017   CREATININE 0.96 10/06/2017   BUN 20 10/06/2017   CO2 30 10/06/2017   TSH 1.208 09/18/2012   PSA 1.42 06/16/2017   HGBA1C 5.3 06/16/2017    Assessment/Plan:  Chronic pain of both shoulders -pt encouraged to f/u with Ortho in regards to options including steroid injections. -Pt also encouraged to re-visit the idea of surgery here in town with Ortho. -Discussed reservations about having unilateral  shoulder surgery let alone bilateral shoulder surgery done out of the country, including possible complications. -pt given note for work.  - Plan: diclofenac sodium (VOLTAREN) 1 % GEL  F/u prn.    Grier Mitts, MD

## 2017-12-24 ENCOUNTER — Ambulatory Visit: Payer: Commercial Managed Care - PPO | Admitting: Family Medicine

## 2017-12-24 VITALS — BP 124/72 | Ht 67.0 in | Wt 149.0 lb

## 2017-12-24 DIAGNOSIS — M25511 Pain in right shoulder: Secondary | ICD-10-CM | POA: Diagnosis not present

## 2017-12-24 DIAGNOSIS — M47812 Spondylosis without myelopathy or radiculopathy, cervical region: Secondary | ICD-10-CM | POA: Diagnosis not present

## 2017-12-24 DIAGNOSIS — R202 Paresthesia of skin: Secondary | ICD-10-CM | POA: Diagnosis not present

## 2017-12-24 DIAGNOSIS — F17200 Nicotine dependence, unspecified, uncomplicated: Secondary | ICD-10-CM | POA: Diagnosis not present

## 2017-12-24 DIAGNOSIS — M25512 Pain in left shoulder: Secondary | ICD-10-CM

## 2017-12-24 MED ORDER — METHYLPREDNISOLONE ACETATE 40 MG/ML IJ SUSP
40.0000 mg | Freq: Once | INTRAMUSCULAR | Status: AC
  Administered 2017-12-24: 40 mg via INTRA_ARTICULAR

## 2017-12-24 MED ORDER — VARENICLINE TARTRATE 1 MG PO TABS: 1.0000 mg | ORAL_TABLET | Freq: Two times a day (BID) | ORAL | 3 refills | Status: DC

## 2017-12-24 NOTE — Progress Notes (Signed)
  Joshua Stephenson - 62 y.o. male MRN [id number]  Date of birth: 1955-10-08    SUBJECTIVE:      Chief Complaint:/ HPI:  Bilateral but right greater than left shoulder pain.  He was unable to get surgery secondary to financial concerns. 2.  He is also having some numbness and tingling in bilateral hands, particularly at night. 3.  Smoker desire to quit   ROS:     No fever, no unusual weight change.  PERTINENT  PMH / PSH FH / / SH:  Past Medical, Surgical, Social, and Family History Reviewed & Updated in the EMR.  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  OBJECTIVE: BP 124/72   Ht 5\' 7"  (1.702 m)   Wt 149 lb (67.6 kg)   BMI 23.34 kg/m   Physical Exam:  Vital signs are reviewed. GENERAL: Well-developed male no acute distress SHOULDERS: Symmetrical.  Full range of motion bilaterally pain with forward flexion and abduction above 90 degrees on the right greater than the left.  Mildly tender to palpation shoulder blade area over the supraspinatus muscle as well as the trapezius muscle.  NECK: Decreased range of motion in forward flexion and very decreased range of motion in flexion and lateral rotation, lateral bending.   NEURO: Intact grip strength bilaterally.  Normal strength bicep tricep forearm PROCEDURE: INJECTION: Patient was given informed consent, signed copy in the chart. Appropriate time out was taken. Area prepped and draped in usual sterile fashion. Ethyl chloride was  used for local anesthesia. A 21 gauge 1 1/2 inch needle was used.. 1 cc of methylprednisolone 40 mg/ml plus 4 cc of 1% lidocaine without epinephrine was injected into the right subacromial bursa using a(n) posterior approach.   The patient tolerated the procedure well. There were no complications. Post procedure instructions were given.   ASSESSMENT & PLAN:  #1.  Shoulder pain bilateral.  Subacromial bursa injection today.  Think a lot of his pain  is coming from acromioclavicular degenerative change but also from rotator cuff syndrome. 2.  Paresthesias in his hands.  I think this is coming from his neck.  Put him on some overhead press exercises and some neck stretching.  If this does not improve, we get a dedicated neck films.  Plan to see him back in 3-4 weeks.  At that time will consider injection of the left shoulder piece had improvement on the right.  Ultimately he would probably benefit from acromioplasty as we have discussed before. #3.  He is trying to quit smoking completed the starter pack of Chantix.  He also can go ahead and give him prescription for continuing therapy which I did.  We discussed smoking cessation

## 2017-12-30 ENCOUNTER — Encounter: Payer: Self-pay | Admitting: Family Medicine

## 2018-01-03 ENCOUNTER — Telehealth: Payer: Self-pay | Admitting: Family Medicine

## 2018-01-03 NOTE — Telephone Encounter (Signed)
Copied from [place] [id number]. Topic: Quick Communication - See Telephone Encounter >> Jan 03, 2018  1:41 PM Percell Belt A wrote: Pt called in and would like a call back to see if the office has rec'd this paperwork?    Best number  [phone]  This was done yesterday afternoon 01/04/2018 and pt was made aware, see previous note.

## 2018-01-04 ENCOUNTER — Ambulatory Visit: Payer: Commercial Managed Care - PPO | Admitting: Physical Therapy

## 2018-01-04 ENCOUNTER — Telehealth: Payer: Self-pay | Admitting: Family Medicine

## 2018-01-04 NOTE — Telephone Encounter (Signed)
FMLA paperwork completed by provider.  Rx/forms faxed. Fax confirmation received. Called patient and left message to return call to notify him.

## 2018-01-04 NOTE — Telephone Encounter (Signed)
Copied from [place] [phone]. Topic: Quick Communication - See Telephone Encounter >> Dec 31, 2017 11:04 AM Boyd Kerbs wrote: CRM for notification.   Pt. Is asking about FMLA papers and needing to have them fax back .  They faxed last week.  See Telephone encounter for: 12/31/17. >> Dec 31, 2017 11:06 AM Boyd Kerbs wrote: Pt. Wants a call back >> Dec 31, 2017 11:34 AM Karene Fry P wrote: Sorry, but this is not a patient at [facility]. Please route accordingly. >> Jan 03, 2018  1:41 PM Percell Belt A wrote: Pt called in and would like a call back to see if the office has rec'd this paperwork?    Best number  [phone]  >> Jan 04, 2018  2:44 PM Percell Belt A wrote: So pt called back and the paperwork is  from [employer] for short term disability?  Have you seen these papers?  This is to start his short term disability

## 2018-01-04 NOTE — Telephone Encounter (Signed)
Copied from [place] [phone]. Topic: Quick Communication - See Telephone Encounter >> Dec 31, 2017 11:04 AM Boyd Kerbs wrote: CRM for notification.   Pt. Is asking about FMLA papers and needing to have them fax back .  They faxed last week.  See Telephone encounter for: 12/31/17. >> Dec 31, 2017 11:06 AM Boyd Kerbs wrote: Pt. Wants a call back >> Dec 31, 2017 11:34 AM Karene Fry P wrote: Sorry, but this is not a patient at [facility]. Please route accordingly. >> Jan 03, 2018  1:41 PM Percell Belt A wrote: Pt called in and would like a call back to see if the office has rec'd this paperwork?    Best number  [phone]  >> Jan 04, 2018  2:44 PM Percell Belt A wrote: So pt called back and the paperwork is  from [employer] for short term disability?  Have you seen these papers?  This is to start his short term disability     Pt would like a call back concerning his short term disability papers

## 2018-01-06 ENCOUNTER — Ambulatory Visit: Payer: Commercial Managed Care - PPO | Admitting: Family Medicine

## 2018-01-06 DIAGNOSIS — Z2089 Contact with and (suspected) exposure to other communicable diseases: Secondary | ICD-10-CM

## 2018-01-18 ENCOUNTER — Telehealth: Payer: Self-pay | Admitting: Family Medicine

## 2018-01-18 ENCOUNTER — Encounter: Payer: Self-pay | Admitting: Family Medicine

## 2018-01-18 NOTE — Telephone Encounter (Signed)
Copied from [place]. Topic: Quick Communication - See Telephone Encounter >> Jan 18, 2018  1:37 PM Robina Ade, Helene Kelp D wrote: CRM for notification. See Telephone encounter for: 01/18/18. Patient called and would like to talk to Dr. Volanda Napoleon or her CMA about taking him out of work. Please call patient back, thanks.

## 2018-01-20 ENCOUNTER — Encounter: Payer: Self-pay | Admitting: Family Medicine

## 2018-01-20 NOTE — Telephone Encounter (Signed)
Pt states his paperwork has to be faxed over today.  Please f/u with pt.

## 2018-01-20 NOTE — Telephone Encounter (Signed)
Patient states paperwork needs to be faxed today. Please advise.

## 2018-01-20 NOTE — Telephone Encounter (Signed)
See 01/18/2018 MyChart encounter.

## 2018-01-20 NOTE — Telephone Encounter (Signed)
Patient called back to [facility] to check status of work note. Patient notified by [facility] that request has been forwarded to PCP and is pending response.

## 2018-01-21 ENCOUNTER — Ambulatory Visit: Payer: Commercial Managed Care - PPO | Admitting: Family Medicine

## 2018-01-21 ENCOUNTER — Telehealth: Payer: Self-pay | Admitting: Family Medicine

## 2018-01-21 VITALS — BP 118/76 | Ht 67.0 in | Wt 150.0 lb

## 2018-01-21 DIAGNOSIS — M25512 Pain in left shoulder: Secondary | ICD-10-CM

## 2018-01-21 DIAGNOSIS — M5010 Cervical disc disorder with radiculopathy, unspecified cervical region: Secondary | ICD-10-CM

## 2018-01-21 MED ORDER — TRAMADOL HCL 50 MG PO TABS: ORAL_TABLET | ORAL | 2 refills | Status: DC

## 2018-01-21 MED ORDER — METHYLPREDNISOLONE ACETATE 40 MG/ML IJ SUSP
40.0000 mg | Freq: Once | INTRAMUSCULAR | Status: AC
  Administered 2018-01-21: 40 mg via INTRA_ARTICULAR

## 2018-01-21 MED ORDER — AMITRIPTYLINE HCL 25 MG PO TABS
25.0000 mg | ORAL_TABLET | Freq: Every day | ORAL | 1 refills | Status: DC
Start: 2018-01-21 — End: 2018-01-25

## 2018-01-21 NOTE — Telephone Encounter (Signed)
Pt is in the office and would like to transfer from Dr. Volanda Napoleon to Joshua Stephenson is it okay for the transfer?

## 2018-01-21 NOTE — Telephone Encounter (Signed)
Pt is aware of the acceptance and is scheduled with Tommi Rumps on Tuesday 5/7 as a transfer.

## 2018-01-21 NOTE — Telephone Encounter (Signed)
ok 

## 2018-01-21 NOTE — Progress Notes (Signed)
Date of Visit: 01/21/2018   HPI:   follow-up of bilateral shoulder pain.    At his last visit on 4/05 he received a right subacromial injection and reports that he noticed a decrease in shoulder stiffness, but persistent pain. The pain is primarily with lifting objects, overhead motion, or internal rotation of the shoulder. He also has trouble sleeping due to his shoulder discomfort. He has been taking the tramadol regularly, but this has not helped. He has not use the voltaren gel as it was too expensive. He does the recommended neck and shoulder exercises approximately 3x/week, but still has significant shoulder pain, shoulder stiffness, neck pain, and numbness/tingling in his hands. The hand numbness/tingling is located in small, ring, and long fingers, and is occasionally accompanied by weakness. 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 He has decided that he would like to pursue surgery (acromioplasty) for the right shoulder and is in the process of obtaining coverage. He is interested in an injection of the left shoulder today. He denies significant erythema or swelling of the joints.  ROS:  Pertinent review of systems: negative for fever or unusual weight change.  [place]: He is currently trying to quit smoking. He states that this process is going well and Chantix has helped him cut back.  PHYSICAL EXAM: BP 118/76   Ht 5\' 7"  (1.702 m)   Wt 150 lb (68 kg)   BMI 23.49 kg/m  Gen: well appearing, NAD HEENT: EOMI Lungs: NWOB  Neuro: Patient reported full sensation throughout the volar and dorsal aspects of the hands.   Shoulders: Inspection reveals no abnormalities, atrophy or asymmetry. Tenderness to palpation present over bilateral AC joints,ROM: Bilateral passive and active FF pain-limited to 90 degrees; abduction pain-limited to 60 degrees; full  passive/avtive ROM with extension; passive/active external rotation pain-limited to approximately 45 degrees. Internal rotation: right thumb able to reach L4, left able to reach T12. Strength 5/5 in flexion/extension/abduction/adduction/IR/ER.  Bilateral positive Hawkin's tests, positive empty can. Negative Yergason's. No labral pathology noted with negative Obrien's.  Neck:  Diffusely TTP Limited extension and lateral flexion, full ROM with forward flexion and rotation.  5/5 strength with resisted rotation. Spurling's negative bilaterally.  PROCEDURE: INJECTION OF LEFT SUBACROMIAL BURSA Patient was given informed consent, signed copy in the chart. Appropriate time out was taken. Area prepped and draped in usual sterile fashion. Ethyl chloride was  used for local anesthesia. A 21 gauge 1 1/2 inch needle was used. 1 cc of methylprednisolone 40 mg/ml plus 4 cc of 1% lidocaine without epinephrine was injected into the left subacromial bursa using a(n) posterior approach.   The patient tolerated the procedure well. There were no complications. Post procedure instructions were given.   ASSESSMENT/PLAN:    Bilateral Shoulder Pain: Patient plans to undergo right acromioplasty in the near future. Given persistent symptoms in the left shoulder and moderate improvement with prior injection of right shoulder, LEFT subacromial injection administered at this visit.  .  -Will try amitriptyline for pain management and aid with sleeping as the tramadol has not helped.   Bilateral Hand Paresthesias: Persistent neck pain and hand tingling/weakness concerning for cervical pathology.  -Neck radiographs ordered at this visit. FOLLOW UP: Follow up in 4-6 weeks for review of neck radiographs and assess for symptomatic improvement.   Joshua Stephenson, [place] Attending  Note: I have seen and examined this patient with the medical student. I have  reviewed the history, physical  examination, assessment and plan as documented in the medical student's note.  I agree with the medical student's note and findings, assessment and treatment plan as documented with the following additions or changes I have made to the note. I performed the procedure.

## 2018-01-21 NOTE — Telephone Encounter (Signed)
Patient's wife called and is upset and wants to speak to [place]. She said that she was needing some paper work done when he was seeing Dr Volanda Napoleon. She said she does not understand why the reply that they are getting is that " he is no longer a patient at this practice " when all he did was transfer from [place] to [place]. She asked for a head person over [place], she said that nothing is getting done. I advised her that I would send Rachel Bo a message [phone]. She feels as if they need to [place] all together.

## 2018-01-21 NOTE — Telephone Encounter (Signed)
Ok

## 2018-01-25 ENCOUNTER — Ambulatory Visit: Payer: Commercial Managed Care - PPO | Admitting: Adult Health

## 2018-01-25 ENCOUNTER — Encounter: Payer: Self-pay | Admitting: Adult Health

## 2018-01-25 ENCOUNTER — Telehealth: Payer: Self-pay | Admitting: *Deleted

## 2018-01-25 VITALS — BP 120/84 | Temp 98.5°F | Wt 156.0 lb

## 2018-01-25 DIAGNOSIS — Z7689 Persons encountering health services in other specified circumstances: Secondary | ICD-10-CM

## 2018-01-25 DIAGNOSIS — Z23 Encounter for immunization: Secondary | ICD-10-CM

## 2018-01-25 DIAGNOSIS — Z1211 Encounter for screening for malignant neoplasm of colon: Secondary | ICD-10-CM

## 2018-01-25 DIAGNOSIS — M25511 Pain in right shoulder: Secondary | ICD-10-CM | POA: Diagnosis not present

## 2018-01-25 NOTE — Progress Notes (Signed)
Patient presents to clinic today to establish care. 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  His last physical was in September 2019   Acute Concerns: Establish Care   Cervical disk disorder - Is taking Tramadol 50 mg, 1-2 tabs, PRN ( prescribed by pain management)  . Reports that pain has been becoming worse. He has surgery coming up at the end of the month. His surgery is being done in [place]. He will need PT evaluation when he comes back.   Chronic Issues: Tobacco use - Quit smoking two months ago. He is using Chantix and gum.   GERD - Controlled with Nexium.    Health Maintenance: Dental -- Does not do routine care [facility] -- Routine Care  Immunizations -- Needs td Colonoscopy -- 2011- Never called back to have colonoscopy reschedule.     Past Medical History:  Diagnosis Date  . Anxiety   . Asthma    as a child  . COPD, mild ([place]) 06/07/2014  . GERD (gastroesophageal reflux disease)   . Hemorrhoids     Past Surgical History:  Procedure Laterality Date  . COLONOSCOPY  2011    Current Outpatient Medications on File Prior to Visit  Medication Sig Dispense Refill  . Esomeprazole Magnesium (NEXIUM PO) Take by mouth.    . nicotine polacrilex (NICORETTE STARTER KIT) 2 MG gum Take 1 each (2 mg total) by mouth as needed for smoking cessation. 100 tablet 0  . ondansetron (ZOFRAN) 4 MG tablet Take 1 tablet (4 mg total) by mouth every 8 (eight) hours as needed for nausea or vomiting. 20 tablet 0  . traMADol (ULTRAM) 50 MG tablet 1-2 tabs po tid prn pain for chronic shoulder pain this is thirty day supply 180 tablet 2  . varenicline (CHANTIX) 1 MG tablet Take 1 tablet (1 mg total) by mouth 2 (two) times daily. 60 tablet 3  . diclofenac sodium (VOLTAREN) 1 % GEL Apply 2 g topically 3 (three) times daily as needed. (Patient not taking: Reported  on 01/25/2018) 100 g 3   No current facility-administered medications on file prior to visit.     No Known Allergies  Family History  Problem Relation Age of Onset  . Diabetes Father   . Heart disease Father   . Cancer Mother        breast   . Cancer Sister        breast   . Cancer Brother        oral cancer   . Cancer Other   . Hypertension Other   . Diabetes Other     Social History   Socioeconomic History  . Marital status: Married    Spouse name: Not on file  . Number of children: 1   . Years of education: 74   . Highest education level: Not on file  Occupational History  . Occupation: TEMP LABORER    Employer: [employer]  Social Needs  . Financial resource strain: Not on file  . Food insecurity:    Worry: Not on file    Inability: Not on file  . Transportation needs:    Medical: Not on file    Non-medical: Not on file  Tobacco Use  . Smoking status: Current Every Day Smoker    Packs/day: 1.00    Years: 20.00    Pack years: 20.00  Types: Cigarettes  . Smokeless tobacco: Never Used  Substance and Sexual Activity  . Alcohol use: Yes    Alcohol/week: 0.6 oz    Types: 1 Cans of beer per week  . Drug use: No  . Sexual activity: Yes    Birth control/protection: None  Lifestyle  . Physical activity:    Days per week: Not on file    Minutes per session: Not on file  . Stress: Not on file  Relationships  . Social connections:    Talks on phone: Not on file    Gets together: Not on file    Attends religious service: Not on file    Active member of club or organization: Not on file    Attends meetings of clubs or organizations: Not on file    Relationship status: Not on file  . Intimate partner violence:    Fear of current or ex partner: Not on file    Emotionally abused: Not on file    Physically abused: Not on file    Forced sexual activity: Not on file  Other Topics Concern  . Not on file  Social History Narrative   Lives with fiance.    1  grown daughter lives [place] of [address]     Review of Systems  Constitutional: Negative.   Cardiovascular: Negative.   Genitourinary: Negative.   Musculoskeletal: Positive for back pain and joint pain.  Neurological: Negative.   Psychiatric/Behavioral: Negative.   All other systems reviewed and are negative.   BP 120/84   Temp 98.5 F (36.9 C) (Oral)   Wt 156 lb (70.8 kg)   BMI 24.43 kg/m   Physical Exam  Constitutional: He is oriented to person, place, and time. He appears well-developed and well-nourished. No distress.  Cardiovascular: Normal rate, regular rhythm, normal heart sounds and intact distal pulses. Exam reveals no friction rub.  No murmur heard. Pulmonary/Chest: Effort normal and breath sounds normal. No stridor. No respiratory distress. He has no wheezes. He has no rales. He exhibits no tenderness.  Musculoskeletal: He exhibits tenderness (right shoulder ). He exhibits no edema or deformity.  Decreased ROM in right shoulder    Neurological: He is alert and oriented to person, place, and time.  Skin: Skin is warm and dry. Capillary refill takes less than 2 seconds. He is not diaphoretic.  Psychiatric: He has a normal mood and affect.  Nursing note and vitals reviewed.   Assessment/Plan: 1. Acute pain of right shoulder - Ambulatory referral to Physical Therapy - Follow up after surgery   2. Encounter to establish care - Follow up in September for CPE  - Follow up sooner if needed  3. Colon cancer screening  - Ambulatory referral to Gastroenterology  4. Need for Td vaccine  - Td : Tetanus/diphtheria >7yo Preservative  free   Dorothyann Peng, NP

## 2018-01-25 NOTE — Telephone Encounter (Signed)
Spoke to the pt.  He needs the letter sent to [employer] at below listed number.  Pt said Tommi Rumps has a letter for him to be out of work.  Do not see letter is Epic.  Please advise.

## 2018-01-25 NOTE — Telephone Encounter (Signed)
Check ' letter' section of epic

## 2018-01-25 NOTE — Patient Instructions (Signed)
Please follow up with you after surgery   Someone will call you to schedule your physical therapy

## 2018-01-25 NOTE — Telephone Encounter (Signed)
Copied from [place] [id number]. Topic: General - Other >> Jan 25, 2018  3:24 PM Joshua Stephenson wrote:  Pt call and left this info for [employer] for a note to be  for him being out of work faxed to the below person and fax number     [place] 1 [phone]

## 2018-01-26 ENCOUNTER — Encounter: Payer: Self-pay | Admitting: Internal Medicine

## 2018-01-26 NOTE — Telephone Encounter (Signed)
Letter faxed and received confirmation that transaction was successful.  No further action required.

## 2018-02-08 HISTORY — PX: SHOULDER ARTHROSCOPY W/ ROTATOR CUFF REPAIR: SHX2400

## 2018-02-15 ENCOUNTER — Ambulatory Visit: Payer: Commercial Managed Care - PPO | Admitting: Adult Health

## 2018-02-16 ENCOUNTER — Ambulatory Visit: Payer: Commercial Managed Care - PPO | Attending: Adult Health | Admitting: Physical Therapy

## 2018-02-16 ENCOUNTER — Encounter: Payer: Self-pay | Admitting: Physical Therapy

## 2018-02-16 ENCOUNTER — Other Ambulatory Visit: Payer: Self-pay

## 2018-02-16 DIAGNOSIS — M25511 Pain in right shoulder: Secondary | ICD-10-CM | POA: Diagnosis present

## 2018-02-16 DIAGNOSIS — M6281 Muscle weakness (generalized): Secondary | ICD-10-CM | POA: Diagnosis present

## 2018-02-16 DIAGNOSIS — M25611 Stiffness of right shoulder, not elsewhere classified: Secondary | ICD-10-CM | POA: Insufficient documentation

## 2018-02-16 DIAGNOSIS — R293 Abnormal posture: Secondary | ICD-10-CM

## 2018-02-16 NOTE — Therapy (Signed)
[facility] [place], [place], [address] Phone: [phone]   Fax:  [phone]  Physical Therapy Evaluation  Patient Details  Name: Joshua Stephenson MRN: [id number] Date of Birth: 05-20-1956 Referring Provider: Dorothyann Peng    Encounter Date: 02/16/2018  PT End of Session - 02/16/18 1241    Visit Number  1    Number of Visits  20    Date for PT Re-Evaluation  03/16/18    Authorization Type  [employer] PPO     Authorization Time Period  02/16/18 to 04/18/18    PT Start Time  1155    PT Stop Time  1233    PT Time Calculation (min)  38 min    Activity Tolerance  Patient tolerated treatment well    Behavior During Therapy  [facility] for tasks assessed/performed       Past Medical History:  Diagnosis Date  . Anxiety   . Asthma    as a child  . COPD, mild ([place]) 06/07/2014  . GERD (gastroesophageal reflux disease)   . Hemorrhoids     Past Surgical History:  Procedure Laterality Date  . COLONOSCOPY  2011    There were no vitals filed for this visit.   Subjective Assessment - 02/16/18 1156    Subjective  I hurt my shoulder at work, and after awhile it got real bad. My work decided to send me to [place] to have my surgery- it was done on 02/08/18; once they got in to do surgery, it was more torn up in there than they thought. My shoulder has been hurting.     How long can you sit comfortably?  my shoulder can start to hurt while I am sitting     How long can you stand comfortably?  no limits when in sling     How long can you walk comfortably?  no limits when in sling     Patient Stated Goals  reduce pain, be able to go back to work     Currently in Pain?  Yes    Pain Score  10-Worst pain ever    Pain Location  Shoulder    Pain Orientation  Right;Posterior    Pain Descriptors / Indicators  Stabbing;Pressure;Tightness    Pain Type  Surgical pain    Pain Radiating Towards  down R UE to 3rd and 4th digits     Pain Onset  In the past 7 days    Pain Frequency  Constant    Aggravating Factors   laying down     Pain Relieving Factors  unsure     Effect of Pain on Daily Activities  severe         [facility] PT Assessment - 02/16/18 0001      Assessment   Medical Diagnosis  s/p R shoulder RCR    Referring Provider  Dorothyann Peng     Onset Date/Surgical Date  02/08/18    Next MD Visit  [place] on June 3rd     Prior Therapy  none       Precautions   Precautions  Shoulder    Precaution Comments  follow [facility] protocol       Restrictions   Weight Bearing Restrictions  Yes    RUE Weight Bearing  Non weight bearing      Balance Screen   Has the patient fallen in the past 6 months  No    Has the patient  had a decrease in activity level because of a fear of falling?   Yes    Is the patient reluctant to leave their home because of a fear of falling?   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 Can weight up to 50#. Constantly moving/pushing pallet.     Leisure  watching football/sports, outside tasks       Observation/Other Assessments   Observations  surgical incisions still healing but appear to be doing well without signs of infection or inflammation       ROM / Strength   AROM / PROM / Strength  PROM      PROM   PROM Assessment Site  Shoulder    Right/Left Shoulder  Right    Right Shoulder Flexion  90 Degrees    Right Shoulder ABduction  60 Degrees    Right Shoulder External Rotation  0 Degrees at 30 degrees ABD per protocol                 Objective measurements completed on examination: See above findings.      [place]  Adult PT Treatment/Exercise - 02/16/18 0001      Exercises   Exercises  Shoulder      Shoulder Exercises: Seated   Other Seated Exercises  grip squeezes 1x10, wrist flexion/extension 1x10, elbow extension/flexion 1x10       Shoulder Exercises: Standing   Other Standing Exercises  horizontal and vertical standing passive shoulder pendulums 1x10 each              PT Education - 02/16/18 1240    Education provided  Yes    Education Details  prognosis, general course of care following this type of surgery, exam findings, HEP    Person(s) Educated  Patient    Methods  Explanation;Demonstration;Handout    Comprehension  Verbalized understanding;Returned demonstration;Need further instruction;Verbal cues required       PT Short Term Goals - 02/16/18 1248      PT SHORT TERM GOAL #1   Title  Patient to demonstrate R shoulder PROM as being full on all measured planes in order to show improved shoulder mobility     Time  4    Period  Weeks    Status  New    Target Date  03/16/18      PT SHORT TERM GOAL #2   Title  Patient to demonstrate R shoulder AROM as being full on all measured planes in order to show improved shoulder mobility and tolerance to activity     Time  4    Period  Weeks    Status  New      PT SHORT TERM GOAL #3   Title  Patient to be able to maintain correct functional posture at least 75% of the time in order to assist in maintaining correct shoulder mechanics and reducing pain     Time  4    Period  Weeks    Status  New      PT SHORT TERM GOAL #4   Title  Patient to be compliant with appropriate HEP, to be updated PRN  Time  1    Period  Weeks    Status  New    Target Date  02/23/18        PT Long Term Goals - 02/16/18 1250      PT LONG TERM GOAL #1   Title  Patient to demonstrate R shoulder strength as being at least 4/5 in all tested groups in order to show improving functional strength and to reduce pain     Time  8    Period  Weeks     Status  New    Target Date  04/13/18      PT LONG TERM GOAL #2   Title  Patient to be able to reach overhead with R shoulder without scapular winging or upper trap compensation pattern in order to show improved shoulder mechanics     Time  8    Period  Weeks    Status  New      PT LONG TERM GOAL #3   Title  Patient to report pain as being no more than 2/10 R shoulder in order to improve QOL and functional task tolerance     Time  8    Period  Weeks    Status  New      PT LONG TERM GOAL #4   Title  Patient to be able to perform all self care tasks with R shoulder independently with no increase in pain in order to show improved self-efficacy     Time  8    Period  Weeks    Status  New             Plan - 02/16/18 1241    Clinical Impression Statement  Patient arrives approximately 8 days after R shoulder RCR, SAD, Mumford procedure/distal clavicle resection, Labrum repair and debridement procedure which was performed in [place] on 02/08/18. Evaluation is very limited due to restraints of protocol, however patient does demonstrate postural limitations, severe limitations in R shoulder ROM and strength, and possible cervical radicular symptoms (needs to be further investigated by PT). He will strongly benefit from skilled PT services to address functional deficits, reduce pain, and progress rehab along course of protocol moving forward.     History and Personal Factors relevant to plan of care:  R shoulder RCR, SAD, distal clavicle resection, labral debridement/repair 02/08/18    Clinical Presentation  Stable    Clinical Presentation due to:  post-op state     Clinical Decision Making  Low    Rehab Potential  Excellent    PT Frequency  Other (comment) 2x/week for 4 weeks, then 3x/week for 4 weeks     PT Duration  8 weeks    PT Treatment/Interventions  ADLs/Self Care Home Management;Biofeedback;Cryotherapy;Electrical Stimulation;Iontophoresis 4mg /ml Dexamethasone;Moist  Heat;Traction;Ultrasound;Functional mobility training;Therapeutic activities;Therapeutic exercise;Balance training;Neuromuscular re-education;Patient/family education;Manual techniques;Scar mobilization;Passive range of motion;Dry needling;Energy conservation;Taping    PT Next Visit Plan  review HEP and goals; use [facility] protocol, but per surgeon no AROM until after 3 weeks. PROM per protocol, postural training, STM as appropriate     PT Home Exercise Plan  Eval: grip squeezes, wrist flexion/extension, elbow flexion/extension, horizontal and vertical PROM pendulums standing     Consulted and Agree with Plan of Care  Patient       Patient will benefit from skilled therapeutic intervention in order to improve the following deficits and impairments:  Decreased skin integrity, Increased fascial restricitons, Pain, Decreased mobility, Decreased scar mobility, Increased muscle spasms,  Postural dysfunction, Decreased activity tolerance, Decreased range of motion, Decreased strength, Hypomobility, Impaired perceived functional ability, Impaired UE functional use, Decreased safety awareness, Impaired flexibility  Visit Diagnosis: Stiffness of right shoulder, not elsewhere classified - Plan: PT plan of care cert/re-cert  Acute pain of right shoulder - Plan: PT plan of care cert/re-cert  Muscle weakness (generalized) - Plan: PT plan of care cert/re-cert  Abnormal posture - Plan: PT plan of care cert/re-cert     Problem List Patient Active Problem List   Diagnosis Date Noted  . COPD, mild ([place]) 06/07/2014  . Smoker 06/07/2014  . GERD (gastroesophageal reflux disease) 06/07/2014  . Hemorrhoid 05/27/2014    Deniece Ree PT, DPT, CBIS  Supplemental Physical Therapist [place]   Pager [facility] [address] [place], [place], [address] Phone: [phone]   Fax:  [phone]  Name: Joshua Stephenson MRN: [id number] Date of Birth: 08-03-1956

## 2018-02-17 ENCOUNTER — Encounter: Payer: Self-pay | Admitting: Physical Therapy

## 2018-02-17 ENCOUNTER — Ambulatory Visit: Payer: Commercial Managed Care - PPO | Admitting: Physical Therapy

## 2018-02-17 DIAGNOSIS — M25611 Stiffness of right shoulder, not elsewhere classified: Secondary | ICD-10-CM | POA: Diagnosis not present

## 2018-02-17 DIAGNOSIS — R293 Abnormal posture: Secondary | ICD-10-CM

## 2018-02-17 DIAGNOSIS — M6281 Muscle weakness (generalized): Secondary | ICD-10-CM

## 2018-02-17 DIAGNOSIS — M25511 Pain in right shoulder: Secondary | ICD-10-CM

## 2018-02-17 NOTE — Therapy (Signed)
[place], [place], [address] Phone: [phone]   Fax:  [phone]  Physical Therapy Treatment  Patient Details  Name: Joshua Stephenson MRN: [id number] Date of Birth: 01-20-1956 Referring Provider: Dorothyann Peng    Encounter Date: 02/17/2018  PT End of Session - 02/16/18 8242    Visit Number  1    Number of Visits  20    Date for PT Re-Evaluation  03/16/18    Authorization Type  UMR/[employer]HC PPO     Authorization Time Period  02/16/18 to 04/18/18    PT Start Time  1155    PT Stop Time  1233    PT Time Calculation (min)  38 min    Activity Tolerance  Patient tolerated treatment well    Behavior During Therapy  [facility] for tasks assessed/performed       Past Medical History:  Diagnosis Date  . Anxiety   . Asthma    as a child  . 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 GERD (gastroesophageal reflux disease)   . Hemorrhoids     Past Surgical History:  Procedure Laterality Date  . COLONOSCOPY  2011    There were no vitals filed for this visit.  Subjective Assessment - 02/17/18 1025    Subjective  Pt arriving to therpay reporting pain of 9/10 in R shoulder. Pt reporting the way he slept could have aggivated his arm.     How long can you sit comfortably?  my shoulder can start to hurt while I am sitting     How long can you stand comfortably?  no limits when in sling     How long can you walk comfortably?  no limits when in sling     Patient Stated Goals  reduce pain, be able to go back to work     Currently in Pain?  Yes    Pain Score  9     Pain Location  Shoulder    Pain Orientation  Right    Pain Descriptors / Indicators  Aching;Throbbing    Pain Type  Surgical pain    Pain Onset  In the past 7 days    Pain Frequency  Constant    Aggravating Factors   laying down    Pain Relieving Factors  ice    Effect of Pain on Daily Activities  unable to perform certain tasks                        [facility] Adult PT Treatment/Exercise - 02/17/18 0001      Shoulder Exercises: Supine   Other Supine Exercises  elbow flexion/extension, pronation/supination      Shoulder Exercises: Seated   Other Seated Exercises  grip squeezes 1x10, wrist flexion/extension 1x10, elbow extension/flexion 1x10       Shoulder Exercises: ROM/Strengthening   Pendulum  circles (both directions), forward/back, side to side each for 1 minute      Modalities   Modalities  Vasopneumatic      Vasopneumatic   Number Minutes Vasopneumatic   15 minutes    Vasopnuematic Location   Shoulder    Vasopneumatic Pressure  Low    Vasopneumatic Temperature   34      Manual Therapy   Manual Therapy  Passive ROM    Manual therapy comments  Pt with increased pain with all shoulder movements, but tolerated  PT Education - 02/17/18 1046    Education Details  Reviewed pendulums    Person(s) Educated  Patient    Methods  Explanation;Verbal cues;Tactile cues;Demonstration    Comprehension  Verbalized understanding;Returned demonstration       PT Short Term Goals - 02/17/18 1031      PT SHORT TERM GOAL #1   Title  Patient to demonstrate R shoulder PROM as being full on all measured planes in order to show improved shoulder mobility     Time  4    Period  Weeks    Status  New      PT SHORT TERM GOAL #2   Title  Patient to demonstrate R shoulder AROM as being full on all measured planes in order to show improved shoulder mobility and tolerance to activity     Time  4    Period  Weeks    Status  New        PT Long Term Goals - 02/16/18 1250      PT LONG TERM GOAL #1   Title  Patient to demonstrate R shoulder strength as being at least 4/5 in all tested groups in order to show improving functional strength and to reduce pain     Time  8    Period  Weeks    Status  New    Target Date  04/13/18      PT LONG TERM GOAL #2   Title  Patient to be able to reach  overhead with R shoulder without scapular winging or upper trap compensation pattern in order to show improved shoulder mechanics     Time  8    Period  Weeks    Status  New      PT LONG TERM GOAL #3   Title  Patient to report pain as being no more than 2/10 R shoulder in order to improve QOL and functional task tolerance     Time  8    Period  Weeks    Status  New      PT LONG TERM GOAL #4   Title  Patient to be able to perform all self care tasks with R shoulder independently with no increase in pain in order to show improved self-efficacy     Time  8    Period  Weeks    Status  New            Plan - 02/17/18 1028    Clinical Impression Statement  Pt arriving to therapy reporting 9/10 pain. Pt reviewed all HEP and needed verbal and tactile cues for pendulums. Active elbow flexion, extension, supinaiton/pronation performed, PROM to R shoulder. Pt tolerating well, reportring mild decrease in pain following vasopneumatic.     PT Frequency  Other (comment)    PT Duration  8 weeks    PT Treatment/Interventions  ADLs/Self Care Home Management;Biofeedback;Cryotherapy;Electrical Stimulation;Iontophoresis 4mg /ml Dexamethasone;Moist Heat;Traction;Ultrasound;Functional mobility training;Therapeutic activities;Therapeutic exercise;Balance training;Neuromuscular re-education;Patient/family education;Manual techniques;Scar mobilization;Passive range of motion;Dry needling;Energy conservation;Taping    PT Next Visit Plan  review HEP and goals; use [facility] protocol, but per surgeon no AROM until after 3 weeks. PROM per protocol, postural training, STM as appropriate     PT Home Exercise Plan  Eval: grip squeezes, wrist flexion/extension, elbow flexion/extension, horizontal and vertical PROM pendulums standing     Consulted and Agree with Plan of Care  Patient       Patient will benefit from skilled therapeutic intervention in order  to improve the following deficits and  impairments:  Decreased skin integrity, Increased fascial restricitons, Pain, Decreased mobility, Decreased scar mobility, Increased muscle spasms, Postural dysfunction, Decreased activity tolerance, Decreased range of motion, Decreased strength, Hypomobility, Impaired perceived functional ability, Impaired UE functional use, Decreased safety awareness, Impaired flexibility  Visit Diagnosis: Stiffness of right shoulder, not elsewhere classified  Acute pain of right shoulder  Muscle weakness (generalized)  Abnormal posture     Problem List Patient Active Problem List   Diagnosis Date Noted  . 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 Smoker 06/07/2014  . GERD (gastroesophageal reflux disease) 06/07/2014  . Hemorrhoid 05/27/2014    Oretha Caprice, MPT 02/17/2018, 10:57 AM  [facility] [address] [place], [place], [address] Phone: [phone]   Fax:  [phone]  Name: Nikitas Davtyan MRN: [id number] Date of Birth: 08/31/1956

## 2018-02-21 ENCOUNTER — Ambulatory Visit: Payer: Commercial Managed Care - PPO | Admitting: Physical Therapy

## 2018-02-22 ENCOUNTER — Ambulatory Visit (INDEPENDENT_AMBULATORY_CARE_PROVIDER_SITE_OTHER): Payer: Commercial Managed Care - PPO | Admitting: Adult Health

## 2018-02-22 ENCOUNTER — Encounter: Payer: Self-pay | Admitting: Adult Health

## 2018-02-22 VITALS — BP 122/90 | Temp 98.4°F | Wt 163.0 lb

## 2018-02-22 DIAGNOSIS — M25511 Pain in right shoulder: Secondary | ICD-10-CM | POA: Diagnosis not present

## 2018-02-22 MED ORDER — NAPROXEN 500 MG PO TABS: 500.0000 mg | ORAL_TABLET | Freq: Two times a day (BID) | ORAL | 1 refills | Status: AC

## 2018-02-22 MED ORDER — OXYCODONE-ACETAMINOPHEN 10-325 MG PO TABS: 1.0000 | ORAL_TABLET | ORAL | 0 refills | Status: AC | PRN

## 2018-02-22 NOTE — Progress Notes (Signed)
Subjective:    Patient ID: Joshua Stephenson, male    DOB: April 25, 1956, 62 y.o.   MRN: [id number]  HPI 62 year old male who  has a past medical history of Anxiety, Asthma, COPD, mild ([place]) (06/07/2014), GERD (gastroesophageal reflux disease), and Hemorrhoids. He presents to the office today for post surgical follow up. He had right shoulder surgery ( RCR, SAD, Mumford procedure/distal clavicle resection, Labrum repair and debridement procedure) on 02/08/2018 in [place]. He reports " the shoulder was more tore up then what was originally thought". He has just started PT services with [facility] and has gone to two sessions. He feels as though he is making some progress with his PT and home exercises. His biggest complaint is that of right shoulder pain. He was prescribed Tramacet,  Zaldiar, and Enantyum by  the surgeon in [place] but that this did not help much and reports that he is out of the medication. Pain at it's worse is 9/10.   Review of Systems See HPI   Past Medical History:  Diagnosis Date  . Anxiety   . Asthma    as a child  . 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 GERD (gastroesophageal reflux disease)   . Hemorrhoids     Social History   Socioeconomic History  . Marital status: Married    Spouse name: Not on file  . Number of children: 1   . Years of education: 1   . Highest education level: Not on file  Occupational History  . Occupation: TEMP LABORER    Employer: [employer]  Social Needs  . Financial resource strain: Not on file  . Food insecurity:    Worry: Not on file    Inability: Not on file  . Transportation needs:    Medical: Not on file    Non-medical: Not on file  Tobacco Use  . Smoking status: Former Smoker    Packs/day: 1.00    Years: 20.00    Pack years: 20.00    Types: Cigarettes  . Smokeless tobacco: Never Used  Substance and Sexual Activity  . Alcohol use: Yes    Alcohol/week: 0.6 oz    Types: 1 Cans of beer per week  . Drug use: No  .  Sexual activity: Yes    Birth control/protection: None  Lifestyle  . Physical activity:    Days per week: Not on file    Minutes per session: Not on file  . Stress: Not on file  Relationships  . Social connections:    Talks on phone: Not on file    Gets together: Not on file    Attends religious service: Not on file    Active member of club or organization: Not on file    Attends meetings of clubs or organizations: Not on file    Relationship status: Not on file  . Intimate partner violence:    Fear of current or ex partner: Not on file    Emotionally abused: Not on file    Physically abused: Not on file    Forced sexual activity: Not on file  Other Topics Concern  . Not on file  Social History Narrative   Lives with fiance.    1 grown daughter lives [place] of [address]    He likes to be outside.     Past Surgical History:  Procedure Laterality Date  . COLONOSCOPY  2011  . SHOULDER ARTHROSCOPY W/ ROTATOR CUFF REPAIR Right 02/08/2018  Family History  Problem Relation Age of Onset  . Diabetes Father   . Heart disease Father   . Hypertension Father   . Cancer Mother        breast   . Cancer Sister        breast   . Cancer Brother        oral cancer   . Cancer Other   . Hypertension Other   . Diabetes Other   . Cancer Brother        oral cancer     No Known Allergies  Current Outpatient Medications on File Prior to Visit  Medication Sig Dispense Refill  . Esomeprazole Magnesium (NEXIUM PO) Take by mouth.    [facility] Kitchen PRESCRIPTION MEDICATION Tramacet 37.5-325  -  1 po bid (7AM and 7PM)    . PRESCRIPTION MEDICATION Enantyum 25mg   -  1 po tid (8am, 4PM, and 12)    . PRESCRIPTION MEDICATION Zaldiar  -  1 po bid    . traMADol (ULTRAM) 50 MG tablet 1-2 tabs po tid prn pain for chronic shoulder pain this is thirty day supply 180 tablet 2  . varenicline (CHANTIX) 1 MG tablet Take 1 tablet (1 mg total) by mouth 2 (two) times daily. 60 tablet 3   No current  facility-administered medications on file prior to visit.     BP 122/90   Temp 98.4 F (36.9 C) (Oral)   Wt 163 lb (73.9 kg)   BMI 25.53 kg/m       Objective:   Physical Exam  Constitutional: He is oriented to person, place, and time. He appears well-developed and well-nourished. No distress.  Cardiovascular: Normal rate, regular rhythm, normal heart sounds and intact distal pulses. Exam reveals no gallop and no friction rub.  No murmur heard. Pulmonary/Chest: Effort normal and breath sounds normal. No stridor. No respiratory distress. He has no wheezes. He has no rales. He exhibits no tenderness.  Musculoskeletal: He exhibits tenderness (right shoulder. ).  Limited ROM to right arm. Has good cap refill 3/5 grip strength   Neurological: He is alert and oriented to person, place, and time.  Skin: He is not diaphoretic.  Nursing note and vitals reviewed.     Assessment & Plan:  1. Acute pain of right shoulder - Advised that I am ok with prescribing a short course of Percocet to help with his pain. Taken Naprosyn and then Percocet for break through pain. Follow up as needed. Continue with PT POC.  - naproxen (NAPROSYN) 500 MG tablet; Take 1 tablet (500 mg total) by mouth 2 (two) times daily with a meal.  Dispense: 180 tablet; Refill: 1 - oxyCODONE-acetaminophen (PERCOCET) 10-325 MG tablet; Take 1 tablet by mouth every 4 (four) hours as needed for up to 3 days for pain.  Dispense: 18 tablet; Refill: 0   Dorothyann Peng, NP

## 2018-02-23 ENCOUNTER — Ambulatory Visit: Payer: Commercial Managed Care - PPO | Attending: Adult Health | Admitting: Physical Therapy

## 2018-02-23 ENCOUNTER — Encounter: Payer: Self-pay | Admitting: Physical Therapy

## 2018-02-23 DIAGNOSIS — M6281 Muscle weakness (generalized): Secondary | ICD-10-CM

## 2018-02-23 DIAGNOSIS — M25611 Stiffness of right shoulder, not elsewhere classified: Secondary | ICD-10-CM

## 2018-02-23 DIAGNOSIS — M25511 Pain in right shoulder: Secondary | ICD-10-CM | POA: Insufficient documentation

## 2018-02-23 DIAGNOSIS — R293 Abnormal posture: Secondary | ICD-10-CM | POA: Diagnosis present

## 2018-02-23 NOTE — Therapy (Addendum)
[place], [place], [address] Phone: [phone]   Fax:  [phone]  Physical Therapy Treatment  Patient Details  Name: Joshua Stephenson MRN: [id number] Date of Birth: [birth year] Referring Provider: Dorothyann Peng    Encounter Date: 02/23/2018  PT End of Session - 02/23/18 1030    Visit Number  3    Number of Visits  20    Date for PT Re-Evaluation  03/16/18    PT Start Time  1030 pt arrived 15 min late    PT Stop Time  1101    PT Time Calculation (min)  31 min    Activity Tolerance  Patient tolerated treatment well    Behavior During Therapy  [facility] for tasks assessed/performed       Past Medical History:  Diagnosis Date  . Anxiety   . Asthma    as a child  . 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 GERD (gastroesophageal reflux disease)   . Hemorrhoids     Past Surgical History:  Procedure Laterality Date  . COLONOSCOPY  2011  . SHOULDER ARTHROSCOPY W/ ROTATOR CUFF REPAIR Right 02/08/2018    There were no vitals filed for this visit.  Subjective Assessment - 02/23/18 1030    Subjective  "I am having increase pain and soreness, I don't know whats going on with it today"     Currently in Pain?  Yes    Pain Score  10-Worst pain ever    Pain Orientation  Right    Pain Type  Surgical pain                       [facility] Adult PT Treatment/Exercise - 02/23/18 0001      Shoulder Exercises: Supine   Other Supine Exercises  elbow flexion/extension, 2 x 15 performed in suine with elbow propped on pillow    Other Supine Exercises  gripping towel 1 x 10 holding each 5  sec       Modalities   Modalities  Electrical Stimulation      Electrical Stimulation   Electrical Stimulation Location  R shoulder    Electrical Stimulation Action  IFC    Electrical Stimulation Parameters  100% scan x 15 min     Electrical Stimulation Goals  Pain used concurrent to PROM to relieve pain      Manual  Therapy   Manual Therapy  Passive ROM    Manual therapy comments  inferior mobs/ distraction mobs grade 3    Passive ROM  flexion/ abduction working into end ranges  with oscillations             PT Education - 02/23/18 1040    Education provided  Yes    Education Details  E-stim and benefits to relieve pain to allow for treatment, updated HEP for table slides.     Person(s) Educated  Patient    Methods  Explanation;Verbal cues    Comprehension  Verbalized understanding;Verbal cues required       PT Short Term Goals - 02/17/18 1031      PT SHORT TERM GOAL #1   Title  Patient to demonstrate R shoulder PROM as being full on all measured planes in order to show improved shoulder mobility     Time  4    Period  Weeks    Status  New      PT SHORT TERM GOAL #2   Title  Patient  to demonstrate R shoulder AROM as being full on all measured planes in order to show improved shoulder mobility and tolerance to activity     Time  4    Period  Weeks    Status  New        PT Long Term Goals - 02/16/18 1250      PT LONG TERM GOAL #1   Title  Patient to demonstrate R shoulder strength as being at least 4/5 in all tested groups in order to show improving functional strength and to reduce pain     Time  8    Period  Weeks    Status  New    Target Date  04/13/18      PT LONG TERM GOAL #2   Title  Patient to be able to reach overhead with R shoulder without scapular winging or upper trap compensation pattern in order to show improved shoulder mechanics     Time  8    Period  Weeks    Status  New      PT LONG TERM GOAL #3   Title  Patient to report pain as being no more than 2/10 R shoulder in order to improve QOL and functional task tolerance     Time  8    Period  Weeks    Status  New      PT LONG TERM GOAL #4   Title  Patient to be able to perform all self care tasks with R shoulder independently with no increase in pain in order to show improved self-efficacy     Time  8     Period  Weeks    Status  New            Plan - 02/23/18 1041    Clinical Impression Statement  pt reports significant paint at 10/10 today. utilized e-stim to help control pain prior to and throughout treatment. continued PROM to promote shoulder mobility. pt reportd pain dropped to 8/10 and was able to improve PROM when focus was taken from his shoulder. updated HEP for table slides to promote moblity.    PT Treatment/Interventions  ADLs/Self Care Home Management;Biofeedback;Cryotherapy;Electrical Stimulation;Iontophoresis 4mg /ml Dexamethasone;Moist Heat;Traction;Ultrasound;Functional mobility training;Therapeutic activities;Therapeutic exercise;Balance training;Neuromuscular re-education;Patient/family education;Manual techniques;Scar mobilization;Passive range of motion;Dry needling;Energy conservation;Taping    PT Next Visit Plan  review HEP and goals; use [facility] protocol, but per surgeon no AROM until after 3 weeks. PROM per protocol, postural training, STM as appropriate     PT Home Exercise Plan  Eval: grip squeezes, wrist flexion/extension, elbow flexion/extension, horizontal and vertical PROM pendulums standing        Patient will benefit from skilled therapeutic intervention in order to improve the following deficits and impairments:  Decreased skin integrity, Increased fascial restricitons, Pain, Decreased mobility, Decreased scar mobility, Increased muscle spasms, Postural dysfunction, Decreased activity tolerance, Decreased range of motion, Decreased strength, Hypomobility, Impaired perceived functional ability, Impaired UE functional use, Decreased safety awareness, Impaired flexibility  Visit Diagnosis: Stiffness of right shoulder, not elsewhere classified  Acute pain of right shoulder  Muscle weakness (generalized)     Problem List Patient Active Problem List   Diagnosis Date Noted  . 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 Smoker 06/07/2014  .  GERD (gastroesophageal reflux disease) 06/07/2014  . Hemorrhoid 05/27/2014   Starr Lake PT, DPT, LAT, ATC  02/23/18  1:28 PM      [facility] [place],  [place], [address] Phone: [phone]   Fax:  [phone]  Name: Joshua Stephenson MRN: [id number] Date of Birth: [birth year]

## 2018-02-24 ENCOUNTER — Ambulatory Visit: Payer: Commercial Managed Care - PPO | Admitting: Physical Therapy

## 2018-02-24 DIAGNOSIS — M25611 Stiffness of right shoulder, not elsewhere classified: Secondary | ICD-10-CM | POA: Diagnosis not present

## 2018-02-24 DIAGNOSIS — R293 Abnormal posture: Secondary | ICD-10-CM

## 2018-02-24 DIAGNOSIS — M6281 Muscle weakness (generalized): Secondary | ICD-10-CM

## 2018-02-24 DIAGNOSIS — M25511 Pain in right shoulder: Secondary | ICD-10-CM

## 2018-02-24 NOTE — Therapy (Signed)
[place], [place], [address] Phone: [phone]   Fax:  [phone]  Physical Therapy Treatment  Patient Details  Name: Joshua Stephenson MRN: [id number] Date of Birth: Sep 27, 1955 Referring Provider: Dorothyann Peng    Encounter Date: 02/24/2018  PT End of Session - 02/24/18 1222    Visit Number  4    Number of Visits  20    Date for PT Re-Evaluation  03/16/18    Authorization Type  [employer] PPO     Authorization Time Period  02/16/18 to 04/18/18    PT Start Time  1145    PT Stop Time  1230    PT Time Calculation (min)  45 min    Activity Tolerance  Patient tolerated treatment well    Behavior During Therapy  [facility] for tasks assessed/performed       Past Medical History:  Diagnosis Date  . Anxiety   . Asthma    as a child  . 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 GERD (gastroesophageal reflux disease)   . Hemorrhoids     Past Surgical History:  Procedure Laterality Date  . COLONOSCOPY  2011  . SHOULDER ARTHROSCOPY W/ ROTATOR CUFF REPAIR Right 02/08/2018    There were no vitals filed for this visit.  Subjective Assessment - 02/24/18 1152    Subjective  Pt arriving to therapy reporting 6/10 pain in right shoulder anteriorly. Pt wearing his sling and reporting he was able to sleep a little better.     How long can you sit comfortably?  my shoulder can start to hurt while I am sitting     How long can you stand comfortably?  no limits when in sling     How long can you walk comfortably?  no limits when in sling     Patient Stated Goals  reduce pain, be able to go back to work     Currently in Pain?  Yes    Pain Score  6     Pain Orientation  Right    Pain Descriptors / Indicators  Aching;Sore    Pain Type  Surgical pain    Pain Onset  1 to 4 weeks ago    Pain Frequency  Constant    Aggravating Factors   laying down    Pain Relieving Factors  ice                       [facility] Adult  PT Treatment/Exercise - 02/24/18 0001      Exercises   Exercises  Shoulder      Shoulder Exercises: Supine   Other Supine Exercises  isometrics x 5 reps holding 5 seconds      Shoulder Exercises: Seated   Other Seated Exercises  bicep curls seated with elbow propped on pillow with 1# weight, pronation/supination x 10 reps with 1# weight    Other Seated Exercises  towel gripping      Modalities   Modalities  Cryotherapy;Electrical Stimulation      Cryotherapy   Number Minutes Cryotherapy  15 Minutes    Cryotherapy Location  Shoulder    Type of Cryotherapy  Ice pack      Electrical Stimulation   Electrical Stimulation Location  R shoulder    Electrical Stimulation Action  IFC    Electrical Stimulation Parameters  80-150 Hz x 15 minutes, intensity to tolerance, 100 %    Electrical Stimulation Goals  Pain  Manual Therapy   Manual Therapy  Passive ROM    Manual therapy comments  inferior mobs/ distraction mobs grade 3    Passive ROM  flexion/ abduction working into end ranges  with oscillations             PT Education - 02/24/18 1219    Education Details  E-stim benefits and reviewed verbally pt's HEP    Person(s) Educated  Patient    Methods  Explanation    Comprehension  Verbalized understanding       PT Short Term Goals - 02/24/18 1226      PT SHORT TERM GOAL #1   Title  Patient to demonstrate R shoulder PROM as being full on all measured planes in order to show improved shoulder mobility     Time  4    Period  Weeks    Status  On-going      PT SHORT TERM GOAL #2   Title  Patient to demonstrate R shoulder AROM as being full on all measured planes in order to show improved shoulder mobility and tolerance to activity     Time  4    Period  Weeks    Status  New      PT SHORT TERM GOAL #3   Time  4    Period  Weeks    Status  New        PT Long Term Goals - 02/16/18 1250      PT LONG TERM GOAL #1   Title  Patient to demonstrate R shoulder  strength as being at least 4/5 in all tested groups in order to show improving functional strength and to reduce pain     Time  8    Period  Weeks    Status  New    Target Date  04/13/18      PT LONG TERM GOAL #2   Title  Patient to be able to reach overhead with R shoulder without scapular winging or upper trap compensation pattern in order to show improved shoulder mechanics     Time  8    Period  Weeks    Status  New      PT LONG TERM GOAL #3   Title  Patient to report pain as being no more than 2/10 R shoulder in order to improve QOL and functional task tolerance     Time  8    Period  Weeks    Status  New      PT LONG TERM GOAL #4   Title  Patient to be able to perform all self care tasks with R shoulder independently with no increase in pain in order to show improved self-efficacy     Time  8    Period  Weeks    Status  New            Plan - 02/24/18 1223    Clinical Impression Statement  pt arriving to therapy reporting 6/10 pain today and reported he was able to sleep a little better. Pt tolerated gentle elbow strengthening and AROM exercises with PROM performed and grade 2-3 mobs to R shoulder. E-stim and ice pack applied and pt reporting less pain of end of session of 3/10. Continue with skilled PT to progress toward pt's PLOF.     Rehab Potential  Excellent    PT Frequency  Other (comment)    PT Duration  8 weeks    PT Treatment/Interventions  ADLs/Self Care Home Management;Biofeedback;Cryotherapy;Electrical Stimulation;Iontophoresis 4mg /ml Dexamethasone;Moist Heat;Traction;Ultrasound;Functional mobility training;Therapeutic activities;Therapeutic exercise;Balance training;Neuromuscular re-education;Patient/family education;Manual techniques;Scar mobilization;Passive range of motion;Dry needling;Energy conservation;Taping    PT Next Visit Plan  review HEP and goals; use [facility] protocol, but per surgeon no AROM until after 3 weeks. PROM per  protocol, postural training, STM as appropriate     PT Home Exercise Plan  Eval: grip squeezes, wrist flexion/extension, elbow flexion/extension, horizontal and vertical PROM pendulums standing     Consulted and Agree with Plan of Care  Patient       Patient will benefit from skilled therapeutic intervention in order to improve the following deficits and impairments:  Decreased skin integrity, Increased fascial restricitons, Pain, Decreased mobility, Decreased scar mobility, Increased muscle spasms, Postural dysfunction, Decreased activity tolerance, Decreased range of motion, Decreased strength, Hypomobility, Impaired perceived functional ability, Impaired UE functional use, Decreased safety awareness, Impaired flexibility  Visit Diagnosis: Stiffness of right shoulder, not elsewhere classified  Acute pain of right shoulder  Muscle weakness (generalized)  Abnormal posture     Problem List Patient Active Problem List   Diagnosis Date Noted  . 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 Smoker 06/07/2014  . GERD (gastroesophageal reflux disease) 06/07/2014  . Hemorrhoid 05/27/2014    Oretha Caprice, MPT 02/24/2018, 12:27 PM  [facility] [address] [place], [place], [address] Phone: [phone]   Fax:  [phone]  Name: Sukhdeep Wieting MRN: [id number] Date of Birth: [birth year]

## 2018-02-28 ENCOUNTER — Ambulatory Visit: Payer: Commercial Managed Care - PPO | Admitting: Physical Therapy

## 2018-02-28 ENCOUNTER — Telehealth: Payer: Self-pay | Admitting: Physical Therapy

## 2018-02-28 NOTE — Telephone Encounter (Signed)
No-show. LMOM regarding today's no show as well as time/date of next scheduled appointment.   Deniece Ree PT, DPT, CBIS  Supplemental Physical Therapist [facility]   Pager [phone]

## 2018-03-01 ENCOUNTER — Ambulatory Visit: Payer: Commercial Managed Care - PPO | Admitting: Physical Therapy

## 2018-03-01 ENCOUNTER — Encounter: Payer: Self-pay | Admitting: Physical Therapy

## 2018-03-01 DIAGNOSIS — R293 Abnormal posture: Secondary | ICD-10-CM

## 2018-03-01 DIAGNOSIS — M25511 Pain in right shoulder: Secondary | ICD-10-CM

## 2018-03-01 DIAGNOSIS — M25611 Stiffness of right shoulder, not elsewhere classified: Secondary | ICD-10-CM

## 2018-03-01 DIAGNOSIS — M6281 Muscle weakness (generalized): Secondary | ICD-10-CM

## 2018-03-01 NOTE — Therapy (Signed)
[place], [place], [address] Phone: [phone]   Fax:  [phone]  Physical Therapy Treatment  Patient Details  Name: Joshua Stephenson MRN: [id number] Date of Birth: 11/25/1955 Referring Provider: Dorothyann Peng    Encounter Date: 03/01/2018  PT End of Session - 03/01/18 1727    Visit Number  5    Number of Visits  20    Date for PT Re-Evaluation  03/16/18    Authorization Type  [employer] PPO     PT Start Time  6387    PT Stop Time  1453    PT Time Calculation (min)  38 min    Activity Tolerance  Patient tolerated treatment well    Behavior During Therapy  [facility] for tasks assessed/performed       Past Medical History:  Diagnosis Date  . Anxiety   . Asthma    as a child  . 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 GERD (gastroesophageal reflux disease)   . Hemorrhoids     Past Surgical History:  Procedure Laterality Date  . COLONOSCOPY  2011  . SHOULDER ARTHROSCOPY W/ ROTATOR CUFF REPAIR Right 02/08/2018    There were no vitals filed for this visit.  Subjective Assessment - 03/01/18 1422    Subjective  Patient reports his pain is about a 6/10 today but he did not take a pain pill. Patient is currently in a sling.     How long can you sit comfortably?  my shoulder can start to hurt while I am sitting     How long can you stand comfortably?  no limits when in sling     How long can you walk comfortably?  no limits when in sling     Patient Stated Goals  reduce pain, be able to go back to work     Currently in Pain?  Yes    Pain Score  6     Pain Location  Shoulder    Pain Orientation  Right    Pain Descriptors / Indicators  Aching;Sore    Pain Type  Surgical pain    Pain Onset  1 to 4 weeks ago    Pain Frequency  Constant    Aggravating Factors   laying down     Pain Relieving Factors  ice     Effect of Pain on Daily Activities  unable to perfrom certain tasks                         [facility] Adult PT Treatment/Exercise - 03/01/18 0001      Shoulder Exercises: Standing   Other Standing Exercises  pendulums 4  way x20 sec each way       Cryotherapy   Number Minutes Cryotherapy  -- Declined Ice. He said he would do it at home.      Manual Therapy   Manual Therapy  Passive ROM    Manual therapy comments  inferior mobs/ distraction mobs grade 3    Passive ROM  flexion/ abduction working into end ranges  with oscillations             PT Education - 03/01/18 1727    Education provided  Yes    Education Details  reviewed pendulums     Person(s) Educated  Patient    Methods  Explanation;Demonstration;Tactile cues;Verbal cues;Handout    Comprehension  Verbalized understanding;Returned demonstration;Verbal cues required;Need further instruction  PT Short Term Goals - 02/24/18 1226      PT SHORT TERM GOAL #1   Title  Patient to demonstrate R shoulder PROM as being full on all measured planes in order to show improved shoulder mobility     Time  4    Period  Weeks    Status  On-going      PT SHORT TERM GOAL #2   Title  Patient to demonstrate R shoulder AROM as being full on all measured planes in order to show improved shoulder mobility and tolerance to activity     Time  4    Period  Weeks    Status  New      PT SHORT TERM GOAL #3   Time  4    Period  Weeks    Status  New        PT Long Term Goals - 02/16/18 1250      PT LONG TERM GOAL #1   Title  Patient to demonstrate R shoulder strength as being at least 4/5 in all tested groups in order to show improving functional strength and to reduce pain     Time  8    Period  Weeks    Status  New    Target Date  04/13/18      PT LONG TERM GOAL #2   Title  Patient to be able to reach overhead with R shoulder without scapular winging or upper trap compensation pattern in order to show improved shoulder mechanics     Time  8    Period  Weeks    Status  New       PT LONG TERM GOAL #3   Title  Patient to report pain as being no more than 2/10 R shoulder in order to improve QOL and functional task tolerance     Time  8    Period  Weeks    Status  New      PT LONG TERM GOAL #4   Title  Patient to be able to perform all self care tasks with R shoulder independently with no increase in pain in order to show improved self-efficacy     Time  8    Period  Weeks    Status  New            Plan - 03/01/18 1728    Clinical Impression Statement  Patient is very limited at this time. He require frequent cuing to rlax his arm. Therapy reviewed pendulums to get patient to relax his arm. He is at 3 weeks post op. Therapy focused just on PROM 2nd to significant pain levels. Patient reports he will be having another RCR in [place] in 2 weeks on the other arm. Patient advised he may want to make sure his right shoulder is doing better.     Clinical Presentation  Stable    Clinical Decision Making  Low    Rehab Potential  Excellent    PT Frequency  2x / week    PT Duration  8 weeks    PT Treatment/Interventions  ADLs/Self Care Home Management;Biofeedback;Cryotherapy;Electrical Stimulation;Iontophoresis 4mg /ml Dexamethasone;Moist Heat;Traction;Ultrasound;Functional mobility training;Therapeutic activities;Therapeutic exercise;Balance training;Neuromuscular re-education;Patient/family education;Manual techniques;Scar mobilization;Passive range of motion;Dry needling;Energy conservation;Taping    PT Next Visit Plan  review HEP and goals; use [facility] protocol, but per surgeon no AROM until after 3 weeks. PROM per protocol, postural training, STM as appropriate     PT Home Exercise  Plan  Eval: grip squeezes, wrist flexion/extension, elbow flexion/extension, horizontal and vertical PROM pendulums standing     Consulted and Agree with Plan of Care  Patient       Patient will benefit from skilled therapeutic intervention in order to improve  the following deficits and impairments:  Decreased skin integrity, Increased fascial restricitons, Pain, Decreased mobility, Decreased scar mobility, Increased muscle spasms, Postural dysfunction, Decreased activity tolerance, Decreased range of motion, Decreased strength, Hypomobility, Impaired perceived functional ability, Impaired UE functional use, Decreased safety awareness, Impaired flexibility  Visit Diagnosis: Stiffness of right shoulder, not elsewhere classified  Acute pain of right shoulder  Muscle weakness (generalized)  Abnormal posture     Problem List Patient Active Problem List   Diagnosis Date Noted  . 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 Smoker 06/07/2014  . GERD (gastroesophageal reflux disease) 06/07/2014  . Hemorrhoid 05/27/2014    Carney Living PT DPT  03/01/2018, 5:33 PM  [facility] [address] [place], [place], [address] Phone: [phone]   Fax:  [phone]  Name: Joshua Stephenson MRN: [id number] Date of Birth: 03-17-1956

## 2018-03-02 ENCOUNTER — Encounter: Payer: Self-pay | Admitting: Physical Therapy

## 2018-03-02 ENCOUNTER — Ambulatory Visit: Payer: Commercial Managed Care - PPO | Admitting: Physical Therapy

## 2018-03-02 DIAGNOSIS — M25611 Stiffness of right shoulder, not elsewhere classified: Secondary | ICD-10-CM | POA: Diagnosis not present

## 2018-03-02 DIAGNOSIS — M25511 Pain in right shoulder: Secondary | ICD-10-CM

## 2018-03-02 DIAGNOSIS — R293 Abnormal posture: Secondary | ICD-10-CM

## 2018-03-02 DIAGNOSIS — M6281 Muscle weakness (generalized): Secondary | ICD-10-CM

## 2018-03-02 NOTE — Therapy (Signed)
[place], [place], [address] Phone: [phone]   Fax:  [phone]  Physical Therapy Treatment  Patient Details  Name: Joshua Stephenson MRN: [id number] Date of Birth: 1956-05-07 Referring Provider: Dorothyann Peng    Encounter Date: 03/02/2018  PT End of Session - 03/02/18 1010    Visit Number  6    Number of Visits  20    Date for PT Re-Evaluation  03/16/18    Authorization Type  [employer] PPO     Authorization Time Period  02/16/18 to 04/18/18    PT Start Time  0933    PT Stop Time  1011    PT Time Calculation (min)  38 min    Activity Tolerance  Patient tolerated treatment well    Behavior During Therapy  [facility] for tasks assessed/performed       Past Medical History:  Diagnosis Date  . Anxiety   . Asthma    as a child  . 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 GERD (gastroesophageal reflux disease)   . Hemorrhoids     Past Surgical History:  Procedure Laterality Date  . COLONOSCOPY  2011  . SHOULDER ARTHROSCOPY W/ ROTATOR CUFF REPAIR Right 02/08/2018    There were no vitals filed for this visit.  Subjective Assessment - 03/02/18 0934    Subjective  My pain was an 11/10 when I woke up but I took pain medicine and now its a 7/10. I saw Einar Pheasant right after I started PT, but not recently, I'm not sure if I see him again before I go but I will be getting an MRI in [place].     Patient Stated Goals  reduce pain, be able to go back to work     Currently in Pain?  Yes    Pain Score  7     Pain Location  Shoulder    Pain Orientation  Right    Pain Descriptors / Indicators  Aching;Sore    Pain Type  Surgical pain    Pain Radiating Towards  none     Pain Onset  1 to 4 weeks ago    Pain Frequency  Constant                       [facility] Adult PT Treatment/Exercise - 03/02/18 0001      Shoulder Exercises: Supine   External Rotation  PROM;12 reps;Other (comment) at 30 degrees ABD to 30  degrees ER per protocol     Flexion  PROM;Right;10 reps to tolerance within protocol, oscillations between each rep     ABduction  PROM;Right;10 reps to 60-70 degrees per protocol       Shoulder Exercises: Seated   Other Seated Exercises  scap retractions 1x20, backwards shoulder rolls 1x10       Manual Therapy   Manual Therapy  Soft tissue mobilization    Manual therapy comments  separate from all other skilled services     Soft tissue mobilization  R deltoids and triceps              PT Education - 03/02/18 1009    Education provided  Yes    Education Details  wife needs to come back friday since he will be out next week travelling; PT recommendation is to delay surgery on L UE if at all possible until R UE reaches functional level; benefits of interventions performed today     Person(s) Educated  Patient    Methods  Explanation    Comprehension  Verbalized understanding       PT Short Term Goals - 02/24/18 1226      PT SHORT TERM GOAL #1   Title  Patient to demonstrate R shoulder PROM as being full on all measured planes in order to show improved shoulder mobility     Time  4    Period  Weeks    Status  On-going      PT SHORT TERM GOAL #2   Title  Patient to demonstrate R shoulder AROM as being full on all measured planes in order to show improved shoulder mobility and tolerance to activity     Time  4    Period  Weeks    Status  New      PT SHORT TERM GOAL #3   Time  4    Period  Weeks    Status  New        PT Long Term Goals - 02/16/18 1250      PT LONG TERM GOAL #1   Title  Patient to demonstrate R shoulder strength as being at least 4/5 in all tested groups in order to show improving functional strength and to reduce pain     Time  8    Period  Weeks    Status  New    Target Date  04/13/18      PT LONG TERM GOAL #2   Title  Patient to be able to reach overhead with R shoulder without scapular winging or upper trap compensation pattern in order to  show improved shoulder mechanics     Time  8    Period  Weeks    Status  New      PT LONG TERM GOAL #3   Title  Patient to report pain as being no more than 2/10 R shoulder in order to improve QOL and functional task tolerance     Time  8    Period  Weeks    Status  New      PT LONG TERM GOAL #4   Title  Patient to be able to perform all self care tasks with R shoulder independently with no increase in pain in order to show improved self-efficacy     Time  8    Period  Weeks    Status  New            Plan - 03/02/18 1010    Clinical Impression Statement  Patient arrives today with ongoing significant pain in R shoulder somewhat limiting ability to tolerate even PROM interventions during recent sessions. Able to somewhat progress PROM today and introduced STM to muscle spasms R deltoid and triceps, with resolution of triceps fasciculation noted following manual techniques today. Patient remains severely limited with R shoulder as he continues to heal, and will strongly benefit from ongoing skilled PT services to progress as able moving forward. If at all possible, from PT perspective strongly recommend delaying L shoulder surgery to allow skilled PT services to focus on rehabilitation and progression of R shoulder to functional level before receiving surgery on L UE.     Rehab Potential  Excellent    PT Frequency  2x / week    PT Duration  8 weeks    PT Treatment/Interventions  ADLs/Self Care Home Management;Biofeedback;Cryotherapy;Electrical Stimulation;Iontophoresis 4mg /ml Dexamethasone;Moist Heat;Traction;Ultrasound;Functional mobility training;Therapeutic activities;Therapeutic exercise;Balance training;Neuromuscular re-education;Patient/family education;Manual techniques;Scar mobilization;Passive range of motion;Dry needling;Energy conservation;Taping  PT Next Visit Plan  review HEP and goals; use [facility] protocol, but per surgeon no AROM until after 3  weeks. PROM per protocol, postural training, STM as appropriate     PT Home Exercise Plan  Eval: grip squeezes, wrist flexion/extension, elbow flexion/extension, horizontal and vertical PROM pendulums standing     Consulted and Agree with Plan of Care  Patient       Patient will benefit from skilled therapeutic intervention in order to improve the following deficits and impairments:  Decreased skin integrity, Increased fascial restricitons, Pain, Decreased mobility, Decreased scar mobility, Increased muscle spasms, Postural dysfunction, Decreased activity tolerance, Decreased range of motion, Decreased strength, Hypomobility, Impaired perceived functional ability, Impaired UE functional use, Decreased safety awareness, Impaired flexibility  Visit Diagnosis: Stiffness of right shoulder, not elsewhere classified  Acute pain of right shoulder  Muscle weakness (generalized)  Abnormal posture     Problem List Patient Active Problem List   Diagnosis Date Noted  . 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 Smoker 06/07/2014  . GERD (gastroesophageal reflux disease) 06/07/2014  . Hemorrhoid 05/27/2014    Deniece Ree PT, DPT, CBIS  Supplemental Physical Therapist [place]   Pager [place] [facility] [address] [place], [place], [address] Phone: [phone]   Fax:  [phone]  Name: Kelen Laura MRN: [id number] Date of Birth: 03/31/1956

## 2018-03-04 ENCOUNTER — Encounter: Payer: Self-pay | Admitting: Physical Therapy

## 2018-03-04 ENCOUNTER — Ambulatory Visit: Payer: Commercial Managed Care - PPO | Admitting: Physical Therapy

## 2018-03-04 DIAGNOSIS — M25511 Pain in right shoulder: Secondary | ICD-10-CM

## 2018-03-04 DIAGNOSIS — M6281 Muscle weakness (generalized): Secondary | ICD-10-CM

## 2018-03-04 DIAGNOSIS — R293 Abnormal posture: Secondary | ICD-10-CM

## 2018-03-04 DIAGNOSIS — M25611 Stiffness of right shoulder, not elsewhere classified: Secondary | ICD-10-CM | POA: Diagnosis not present

## 2018-03-04 NOTE — Therapy (Signed)
[place], [place], [address] Phone: [phone]   Fax:  [phone]  Physical Therapy Treatment  Patient Details  Name: Joshua Stephenson MRN: [id number] Date of Birth: 1956/04/08 Referring Provider: Dorothyann Peng    Encounter Date: 03/04/2018  PT End of Session - 03/04/18 1108    Visit Number  7    Number of Visits  20    Date for PT Re-Evaluation  03/16/18    Authorization Type  [employer]R/[employer] PPO     Authorization Time Period  02/16/18 to 04/18/18    PT Start Time  0939 patient arrived late     PT Stop Time  1011    PT Time Calculation (min)  32 min    Activity Tolerance  Patient tolerated treatment well    Behavior During Therapy  [facility] for tasks assessed/performed       Past Medical History:  Diagnosis Date  . Anxiety   . Asthma    as a child  . 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 GERD (gastroesophageal reflux disease)   . Hemorrhoids     Past Surgical History:  Procedure Laterality Date  . COLONOSCOPY  2011  . SHOULDER ARTHROSCOPY W/ ROTATOR CUFF REPAIR Right 02/08/2018    There were no vitals filed for this visit.  Subjective Assessment - 03/04/18 0939    Subjective  My shoulder gave me a fit last night but the pain pills helped, the massage we did last time helped. I've not yet faxed the note form PT last time.     Patient Stated Goals  reduce pain, be able to go back to work     Currently in Pain?  Yes    Pain Score  9     Pain Location  Shoulder    Pain Orientation  Right    Pain Descriptors / Indicators  Aching;Sore                       [facility] Adult PT Treatment/Exercise - 03/04/18 0001      Shoulder Exercises: Supine   External Rotation  PROM;15 reps;Right at 30 degrees ABD, to 30 degrees per protocol     Flexion  PROM;Right;15 reps;Other (comment) approx 140-150 degrees today     ABduction  PROM;Right;15 reps to 70 degrees per protocol       Manual Therapy   Manual Therapy  Soft tissue mobilization    Manual therapy comments  separate from all other skilled services     Soft tissue mobilization  R deltoids and triceps, biceps              PT Education - 03/04/18 1107    Education provided  Yes    Education Details  education and practice for PROM flexion and ABD of R shoulder, scar massage, frequency for performing these activities while on their trip     Person(s) Educated  Patient;Spouse    Methods  Explanation;Demonstration;Verbal cues    Comprehension  Verbalized understanding;Returned demonstration       PT Short Term Goals - 02/24/18 1226      PT SHORT TERM GOAL #1   Title  Patient to demonstrate R shoulder PROM as being full on all measured planes in order to show improved shoulder mobility     Time  4    Period  Weeks    Status  On-going      PT SHORT TERM GOAL #2  Title  Patient to demonstrate R shoulder AROM as being full on all measured planes in order to show improved shoulder mobility and tolerance to activity     Time  4    Period  Weeks    Status  New      PT SHORT TERM GOAL #3   Time  4    Period  Weeks    Status  New        PT Long Term Goals - 02/16/18 1250      PT LONG TERM GOAL #1   Title  Patient to demonstrate R shoulder strength as being at least 4/5 in all tested groups in order to show improving functional strength and to reduce pain     Time  8    Period  Weeks    Status  New    Target Date  04/13/18      PT LONG TERM GOAL #2   Title  Patient to be able to reach overhead with R shoulder without scapular winging or upper trap compensation pattern in order to show improved shoulder mechanics     Time  8    Period  Weeks    Status  New      PT LONG TERM GOAL #3   Title  Patient to report pain as being no more than 2/10 R shoulder in order to improve QOL and functional task tolerance     Time  8    Period  Weeks    Status  New      PT LONG TERM GOAL #4   Title  Patient to be able to  perform all self care tasks with R shoulder independently with no increase in pain in order to show improved self-efficacy     Time  8    Period  Weeks    Status  New            Plan - 03/04/18 1108    Clinical Impression Statement  Patient arrives late today but has his wife with him for education as requested by PT; education performed regarding PROM and scar massage while they are out of town next week. Continued work on PROM due to high pain levels and continued work on spasms and trigger points throughout R shoulder biceps, triceps, and deltoids. Patient and family aware of PT recommendations for exercise and PROM during their trip next week.     Rehab Potential  Excellent    PT Frequency  2x / week    PT Duration  8 weeks    PT Treatment/Interventions  ADLs/Self Care Home Management;Biofeedback;Cryotherapy;Electrical Stimulation;Iontophoresis 4mg /ml Dexamethasone;Moist Heat;Traction;Ultrasound;Functional mobility training;Therapeutic activities;Therapeutic exercise;Balance training;Neuromuscular re-education;Patient/family education;Manual techniques;Scar mobilization;Passive range of motion;Dry needling;Energy conservation;Taping    PT Next Visit Plan  review HEP and goals; use [facility] protocol, but per surgeon no AROM until after 3 weeks. PROM per protocol, postural training, STM as appropriate     PT Home Exercise Plan  Eval: grip squeezes, wrist flexion/extension, elbow flexion/extension, horizontal and vertical PROM pendulums standing     Consulted and Agree with Plan of Care  Patient       Patient will benefit from skilled therapeutic intervention in order to improve the following deficits and impairments:  Decreased skin integrity, Increased fascial restricitons, Pain, Decreased mobility, Decreased scar mobility, Increased muscle spasms, Postural dysfunction, Decreased activity tolerance, Decreased range of motion, Decreased strength, Hypomobility,  Impaired perceived functional ability, Impaired UE functional use, Decreased safety awareness,  Impaired flexibility  Visit Diagnosis: Stiffness of right shoulder, not elsewhere classified  Acute pain of right shoulder  Muscle weakness (generalized)  Abnormal posture     Problem List Patient Active Problem List   Diagnosis Date Noted  . 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 Smoker 06/07/2014  . GERD (gastroesophageal reflux disease) 06/07/2014  . Hemorrhoid 05/27/2014    Deniece Ree PT, DPT, CBIS  Supplemental Physical Therapist [place]   Pager [place] [address] [place], [place], [address] Phone: [phone]   Fax:  [phone]  Name: Joshua Stephenson MRN: [id number] Date of Birth: 15-Apr-1956

## 2018-03-07 ENCOUNTER — Ambulatory Visit: Payer: Commercial Managed Care - PPO | Admitting: Physical Therapy

## 2018-03-09 ENCOUNTER — Encounter: Payer: Commercial Managed Care - PPO | Admitting: Physical Therapy

## 2018-03-11 ENCOUNTER — Encounter: Payer: Commercial Managed Care - PPO | Admitting: Physical Therapy

## 2018-03-14 ENCOUNTER — Ambulatory Visit: Payer: Commercial Managed Care - PPO | Admitting: Physical Therapy

## 2018-03-14 ENCOUNTER — Telehealth: Payer: Self-pay | Admitting: Physical Therapy

## 2018-03-14 NOTE — Telephone Encounter (Signed)
Called and spoke to patient, who reports he called to cancel this appointment last week as he is scheduled for an MRI around the same time. He plans to be at his appointment on Wednesday.  Deniece Ree PT, DPT, CBIS  Supplemental Physical Therapist [facility]   Pager [phone]

## 2018-03-16 ENCOUNTER — Encounter: Payer: Self-pay | Admitting: Physical Therapy

## 2018-03-16 ENCOUNTER — Ambulatory Visit: Payer: Commercial Managed Care - PPO | Admitting: Physical Therapy

## 2018-03-16 DIAGNOSIS — M6281 Muscle weakness (generalized): Secondary | ICD-10-CM

## 2018-03-16 DIAGNOSIS — M25611 Stiffness of right shoulder, not elsewhere classified: Secondary | ICD-10-CM

## 2018-03-16 DIAGNOSIS — R293 Abnormal posture: Secondary | ICD-10-CM

## 2018-03-16 DIAGNOSIS — M25511 Pain in right shoulder: Secondary | ICD-10-CM

## 2018-03-16 NOTE — Therapy (Signed)
[place], [place], [address] Phone: [phone]   Fax:  [phone]  Physical Therapy Treatment (ERO)  Patient Details  Name: Jonmichael Beadnell MRN: [id number] Date of Birth: 01-26-1956 Referring Provider: Dorothyann Peng    Encounter Date: 03/16/2018  PT End of Session - 03/16/18 1109    Visit Number  8    Number of Visits  20    Date for PT Re-Evaluation  04/13/18    Authorization Type  [employer]R/[employer]HC PPO     Authorization Time Period  02/16/18 to 04/18/18    PT Start Time  1018    PT Stop Time  1059    PT Time Calculation (min)  41 min    Activity Tolerance  Patient tolerated treatment well    Behavior During Therapy  [facility] for tasks assessed/performed       Past Medical History:  Diagnosis Date  . Anxiety   . Asthma    as a child  . 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 GERD (gastroesophageal reflux disease)   . Hemorrhoids     Past Surgical History:  Procedure Laterality Date  . COLONOSCOPY  2011  . SHOULDER ARTHROSCOPY W/ ROTATOR CUFF REPAIR Right 02/08/2018    There were no vitals filed for this visit.  Subjective Assessment - 03/16/18 1020    Subjective  My trip was relaxing, my wife helped me keep up with my PT. My arm is feeling better. I had imaging done on Monday on the left shoulder, I don't know the results. I am still planning on having hte left shoulder done next week.     How long can you sit comfortably?  6/26- riding in a car, about an hour     How long can you stand comfortably?  6/26- no limits in sling     How long can you walk comfortably?  6/26- no limits in sling     Patient Stated Goals  reduce pain, be able to go back to work     Currently in Pain?  Yes    Pain Score  5     Pain Orientation  Right    Pain Descriptors / Indicators  Aching;Sore    Pain Type  Surgical pain    Pain Radiating Towards  none     Pain Onset  More than a month ago    Pain Frequency  Constant    Aggravating Factors   laying down, lifting items     Pain Relieving Factors  ice    Effect of Pain on Daily Activities  severe          [facility] PT Assessment - 03/16/18 0001      AROM   AROM Assessment Site  Shoulder    Right/Left Shoulder  Right    Right Shoulder Flexion  101 Degrees AAROM    Right Shoulder ABduction  70 Degrees AAROM       PROM   Right Shoulder Flexion  145 Degrees    Right Shoulder ABduction  95 Degrees    Right Shoulder External Rotation  45 Degrees at 45 degrees shoulder ABD                    [facility] Adult PT Treatment/Exercise - 03/16/18 0001      Shoulder Exercises: Supine   External Rotation  PROM;10 reps 45 degrees at 45 degrees ABD per protocol     Flexion  PROM;Right;10 reps;AAROM;15 reps  ABduction  PROM;10 reps;AAROM;15 reps to 90 degrees as per protocol              PT Education - 03/16/18 1108    Education provided  Yes    Education Details  goal review, progress thus far, POC moving forward especially in face of upcoming surgery    Person(s) Educated  Patient    Methods  Explanation    Comprehension  Verbalized understanding       PT Short Term Goals - 03/16/18 1034      PT SHORT TERM GOAL #1   Title  Patient to demonstrate R shoulder PROM as being full on all measured planes in order to show improved shoulder mobility     Baseline  6/26- progressing well, limited by protocol     Time  4    Period  Weeks    Status  On-going      PT SHORT TERM GOAL #2   Title  Patient to demonstrate R shoulder AROM as being full on all measured planes in order to show improved shoulder mobility and tolerance to activity     Baseline  6/26- initiated today, ongoing     Time  4    Period  Weeks    Status  On-going      PT SHORT TERM GOAL #3   Title  Patient to be able to maintain correct functional posture at least 75% of the time in order to assist in maintaining correct shoulder mechanics and reducing pain     Baseline  6/26-  ongoing    Time  4    Period  Weeks    Status  On-going      PT SHORT TERM GOAL #4   Title  Patient to be compliant with appropriate HEP, to be updated PRN     Baseline  6/26- compliant, wife assist     Time  1    Period  Weeks    Status  Achieved        PT Long Term Goals - 03/16/18 1036      PT LONG TERM GOAL #1   Title  Patient to demonstrate R shoulder strength as being at least 4/5 in all tested groups in order to show improving functional strength and to reduce pain     Baseline  6/26- ongoing     Time  8    Period  Weeks    Status  On-going      PT LONG TERM GOAL #2   Title  Patient to be able to reach overhead with R shoulder without scapular winging or upper trap compensation pattern in order to show improved shoulder mechanics     Baseline  6/26- ongoing     Time  8    Period  Weeks    Status  On-going      PT LONG TERM GOAL #3   Title  Patient to report pain as being no more than 2/10 R shoulder in order to improve QOL and functional task tolerance     Baseline  6/26- 5/10     Time  8    Period  Weeks    Status  On-going      PT LONG TERM GOAL #4   Title  Patient to be able to perform all self care tasks with R shoulder independently with no increase in pain in order to show improved self-efficacy     Baseline  6/26- ongoing  Time  8    Period  Weeks    Status  On-going            Plan - 03/16/18 1109    Clinical Impression Statement  Re-assessment performed today. Patient is progressing well, but slowly, with skilled PT services and shows improvements in PROM; he was also able to tolerate AAROM based interventions this session as well. He does continue to demonstrate severe ROM limitation in R shoulder as well as severe muscle atrophy and functional weakness as would be expected post rotator cuff repair of this extent. Strongly recommend continuation of skilled PT services to continue addressing functional deficits, will plan to continue to treat  patient following his upcoming surgery for L RCR as well.     Rehab Potential  Excellent    PT Frequency  2x / week    PT Duration  8 weeks    PT Treatment/Interventions  ADLs/Self Care Home Management;Biofeedback;Cryotherapy;Electrical Stimulation;Iontophoresis 4mg /ml Dexamethasone;Moist Heat;Traction;Ultrasound;Functional mobility training;Therapeutic activities;Therapeutic exercise;Balance training;Neuromuscular re-education;Patient/family education;Manual techniques;Scar mobilization;Passive range of motion;Dry needling;Energy conservation;Taping    PT Next Visit Plan  continue to progress per protocol. When is his surgery scheduled for L shoulder? Teach wife AAROM for R shoulder     PT Home Exercise Plan  Eval: grip squeezes, wrist flexion/extension, elbow flexion/extension, horizontal and vertical PROM pendulums standing     Consulted and Agree with Plan of Care  Patient       Patient will benefit from skilled therapeutic intervention in order to improve the following deficits and impairments:  Decreased skin integrity, Increased fascial restricitons, Pain, Decreased mobility, Decreased scar mobility, Increased muscle spasms, Postural dysfunction, Decreased activity tolerance, Decreased range of motion, Decreased strength, Hypomobility, Impaired perceived functional ability, Impaired UE functional use, Decreased safety awareness, Impaired flexibility  Visit Diagnosis: Stiffness of right shoulder, not elsewhere classified  Acute pain of right shoulder  Muscle weakness (generalized)  Abnormal posture     Problem List Patient Active Problem List   Diagnosis Date Noted  . 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 Smoker 06/07/2014  . GERD (gastroesophageal reflux disease) 06/07/2014  . Hemorrhoid 05/27/2014    Deniece Ree PT, DPT, CBIS  Supplemental Physical Therapist [place]   Pager [facility] [address] [place], [place], [address] Phone: [phone]   Fax:  [phone]  Name: Stephano Arrants MRN: [id number] Date of Birth: 10/14/1955

## 2018-03-18 ENCOUNTER — Encounter: Payer: Self-pay | Admitting: Physical Therapy

## 2018-03-18 ENCOUNTER — Ambulatory Visit: Payer: Commercial Managed Care - PPO | Admitting: Physical Therapy

## 2018-03-18 DIAGNOSIS — M25611 Stiffness of right shoulder, not elsewhere classified: Secondary | ICD-10-CM | POA: Diagnosis not present

## 2018-03-18 DIAGNOSIS — M6281 Muscle weakness (generalized): Secondary | ICD-10-CM

## 2018-03-18 DIAGNOSIS — M25511 Pain in right shoulder: Secondary | ICD-10-CM

## 2018-03-18 DIAGNOSIS — R293 Abnormal posture: Secondary | ICD-10-CM

## 2018-03-18 NOTE — Therapy (Signed)
[place], [place], [address] Phone: [phone]   Fax:  [phone]  Physical Therapy Treatment  Patient Details  Name: Joshua Stephenson MRN: [id number] Date of Birth: 05/03/1956 Referring Provider: Dorothyann Peng    Encounter Date: 03/18/2018  PT End of Session - 03/18/18 1055    Visit Number  9    Number of Visits  20    Date for PT Re-Evaluation  04/13/18    Authorization Type  [employer] PPO     Authorization Time Period  02/16/18 to 04/18/18    PT Start Time  1017    PT Stop Time  1055    PT Time Calculation (min)  38 min    Activity Tolerance  Patient tolerated treatment well    Behavior During Therapy  [facility] for tasks assessed/performed       Past Medical History:  Diagnosis Date  . Anxiety   . Asthma    as a child  . 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 GERD (gastroesophageal reflux disease)   . Hemorrhoids     Past Surgical History:  Procedure Laterality Date  . COLONOSCOPY  2011  . SHOULDER ARTHROSCOPY W/ ROTATOR CUFF REPAIR Right 02/08/2018    There were no vitals filed for this visit.  Subjective Assessment - 03/18/18 1020    Subjective  I am not wearing my sling today, I was tired of it, my doctor has not told me to take it off yet. I have been trying to actively move my arm. I should hear something between now and next week on when my next surgery will be.     Patient Stated Goals  reduce pain, be able to go back to work     Currently in Pain?  Yes    Pain Score  6     Pain Location  Shoulder    Pain Orientation  Right    Pain Descriptors / Indicators  Aching;Sore    Pain Type  Surgical pain                       [facility] Adult PT Treatment/Exercise - 03/18/18 0001      Shoulder Exercises: Supine   External Rotation  PROM;15 reps;AAROM;20 reps    Flexion  PROM;15 reps;AAROM;20 reps    ABduction  PROM;15 reps;AAROM;20 reps      Manual Therapy   Manual Therapy   Soft tissue mobilization    Manual therapy comments  separate from all other skilled services     Soft tissue mobilization  R mid and anterior deltoid, R distal pec, coracobracihalis              PT Education - 03/18/18 1055    Education provided  Yes    Education Details  importance of wearing sling per MD advice, importance of not pushing tissues faster than MD protocol recommends to allow for healing time, exercise form, use of heat at home to address painful muscle spasms in R UE     Person(s) Educated  Patient    Methods  Explanation    Comprehension  Verbalized understanding;Need further instruction       PT Short Term Goals - 03/16/18 1034      PT SHORT TERM GOAL #1   Title  Patient to demonstrate R shoulder PROM as being full on all measured planes in order to show improved shoulder mobility     Baseline  6/26-  progressing well, limited by protocol     Time  4    Period  Weeks    Status  On-going      PT SHORT TERM GOAL #2   Title  Patient to demonstrate R shoulder AROM as being full on all measured planes in order to show improved shoulder mobility and tolerance to activity     Baseline  6/26- initiated today, ongoing     Time  4    Period  Weeks    Status  On-going      PT SHORT TERM GOAL #3   Title  Patient to be able to maintain correct functional posture at least 75% of the time in order to assist in maintaining correct shoulder mechanics and reducing pain     Baseline  6/26- ongoing    Time  4    Period  Weeks    Status  On-going      PT SHORT TERM GOAL #4   Title  Patient to be compliant with appropriate HEP, to be updated PRN     Baseline  6/26- compliant, wife assist     Time  1    Period  Weeks    Status  Achieved        PT Long Term Goals - 03/16/18 1036      PT LONG TERM GOAL #1   Title  Patient to demonstrate R shoulder strength as being at least 4/5 in all tested groups in order to show improving functional strength and to reduce pain      Baseline  6/26- ongoing     Time  8    Period  Weeks    Status  On-going      PT LONG TERM GOAL #2   Title  Patient to be able to reach overhead with R shoulder without scapular winging or upper trap compensation pattern in order to show improved shoulder mechanics     Baseline  6/26- ongoing     Time  8    Period  Weeks    Status  On-going      PT LONG TERM GOAL #3   Title  Patient to report pain as being no more than 2/10 R shoulder in order to improve QOL and functional task tolerance     Baseline  6/26- 5/10     Time  8    Period  Weeks    Status  On-going      PT LONG TERM GOAL #4   Title  Patient to be able to perform all self care tasks with R shoulder independently with no increase in pain in order to show improved self-efficacy     Baseline  6/26- ongoing     Time  8    Period  Weeks    Status  On-going            Plan - 03/18/18 1056    Clinical Impression Statement  Patient arrived without sling and reports he has been trying to do AROM with R UE; education provided regarding importance of sticking to restraints of protocol and allowing tissues to heal/not progressing too fast without MD clearance. Otherwise continued work on PROM and continued work with [employer] based activities as tolerated and as allowed per protocol. STM performed to anterior R deltoid and pecs, medial deltoid due to painful muscle spasm. Continue to await surgery date for L UE.      Rehab Potential  Excellent  PT Frequency  2x / week    PT Duration  8 weeks    PT Treatment/Interventions  ADLs/Self Care Home Management;Biofeedback;Cryotherapy;Electrical Stimulation;Iontophoresis 4mg /ml Dexamethasone;Moist Heat;Traction;Ultrasound;Functional mobility training;Therapeutic activities;Therapeutic exercise;Balance training;Neuromuscular re-education;Patient/family education;Manual techniques;Scar mobilization;Passive range of motion;Dry needling;Energy conservation;Taping    PT Next Visit Plan   continue to progress per protocol. When is his surgery scheduled for L shoulder? Teach wife AAROM for R shoulder     PT Home Exercise Plan  Eval: grip squeezes, wrist flexion/extension, elbow flexion/extension, horizontal and vertical PROM pendulums standing     Consulted and Agree with Plan of Care  Patient       Patient will benefit from skilled therapeutic intervention in order to improve the following deficits and impairments:  Decreased skin integrity, Increased fascial restricitons, Pain, Decreased mobility, Decreased scar mobility, Increased muscle spasms, Postural dysfunction, Decreased activity tolerance, Decreased range of motion, Decreased strength, Hypomobility, Impaired perceived functional ability, Impaired UE functional use, Decreased safety awareness, Impaired flexibility  Visit Diagnosis: Stiffness of right shoulder, not elsewhere classified  Acute pain of right shoulder  Muscle weakness (generalized)  Abnormal posture     Problem List Patient Active Problem List   Diagnosis Date Noted  . 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 Smoker 06/07/2014  . GERD (gastroesophageal reflux disease) 06/07/2014  . Hemorrhoid 05/27/2014    Deniece Ree PT, DPT, CBIS  Supplemental Physical Therapist [place]   Pager [place] [facility] [address] [place], [place], [address] Phone: [phone]   Fax:  [phone]  Name: Joshua Stephenson MRN: [id number] Date of Birth: [birth year]

## 2018-03-21 ENCOUNTER — Ambulatory Visit: Payer: Commercial Managed Care - PPO | Attending: Adult Health | Admitting: Physical Therapy

## 2018-03-21 ENCOUNTER — Encounter: Payer: Self-pay | Admitting: Physical Therapy

## 2018-03-21 ENCOUNTER — Other Ambulatory Visit: Payer: Self-pay | Admitting: Family Medicine

## 2018-03-21 DIAGNOSIS — M25611 Stiffness of right shoulder, not elsewhere classified: Secondary | ICD-10-CM | POA: Diagnosis not present

## 2018-03-21 DIAGNOSIS — M25511 Pain in right shoulder: Secondary | ICD-10-CM | POA: Diagnosis present

## 2018-03-21 DIAGNOSIS — R293 Abnormal posture: Secondary | ICD-10-CM | POA: Insufficient documentation

## 2018-03-21 DIAGNOSIS — M6281 Muscle weakness (generalized): Secondary | ICD-10-CM | POA: Diagnosis present

## 2018-03-21 NOTE — Therapy (Signed)
[place], [place], [address] Phone: [phone]   Fax:  [phone]  Physical Therapy Treatment  Patient Details  Name: Joshua Stephenson MRN: [id number] Date of Birth: Jul 17, 1956 Referring Provider: Dorothyann Peng    Encounter Date: 03/21/2018  PT End of Session - 03/21/18 1056    Visit Number  10    Number of Visits  20    Date for PT Re-Evaluation  04/13/18    Authorization Type  UMR/UHC PPO     Authorization Time Period  02/16/18 to 04/18/18    PT Start Time  1017    PT Stop Time  1055    PT Time Calculation (min)  38 min    Activity Tolerance  Patient tolerated treatment well    Behavior During Therapy  [facility] for tasks assessed/performed       Past Medical History:  Diagnosis Date  . Anxiety   . Asthma    as a child  . 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 GERD (gastroesophageal reflux disease)   . Hemorrhoids     Past Surgical History:  Procedure Laterality Date  . COLONOSCOPY  2011  . SHOULDER ARTHROSCOPY W/ ROTATOR CUFF REPAIR Right 02/08/2018    There were no vitals filed for this visit.  Subjective Assessment - 03/21/18 1018    Subjective  My shoulder felt better after last session, its sore more than anything really. I'm having pain at night with it again. I'm wearing my sling again like I told you to.     Patient Stated Goals  reduce pain, be able to go back to work     Currently in Pain?  Yes    Pain Score  6     Pain Location  Shoulder    Pain Orientation  Right    Pain Descriptors / Indicators  Aching;Sore                       [facility] Adult PT Treatment/Exercise - 03/21/18 0001      Shoulder Exercises: Supine   External Rotation  PROM;Right;15 reps    Flexion  PROM;15 reps;AAROM;Right;20 reps    ABduction  PROM;15 reps;AAROM;Right;20 reps      Shoulder Exercises: Seated   External Rotation  AAROM;Right;20 reps    Other Seated Exercises  scap retractions  1x20, backwards shoulder rolls 1x10     Other Seated Exercises  shoulder flexoin/ABD/ER isometrics 1x5 each       Manual Therapy   Manual Therapy  Soft tissue mobilization    Manual therapy comments  separate from all other skilled services     Soft tissue mobilization  R middle delt and tricep              PT Education - 03/21/18 1056    Education provided  Yes    Education Details  restraints of protocol, potential benefits of DN, call Md about when he can take sling off, exercise form     Person(s) Educated  Patient    Methods  Explanation    Comprehension  Verbalized understanding       PT Short Term Goals - 03/16/18 1034      PT SHORT TERM GOAL #1   Title  Patient to demonstrate R shoulder PROM as being full on all measured planes in order to show improved shoulder mobility     Baseline  6/26- progressing well, limited by protocol  Time  4    Period  Weeks    Status  On-going      PT SHORT TERM GOAL #2   Title  Patient to demonstrate R shoulder AROM as being full on all measured planes in order to show improved shoulder mobility and tolerance to activity     Baseline  6/26- initiated today, ongoing     Time  4    Period  Weeks    Status  On-going      PT SHORT TERM GOAL #3   Title  Patient to be able to maintain correct functional posture at least 75% of the time in order to assist in maintaining correct shoulder mechanics and reducing pain     Baseline  6/26- ongoing    Time  4    Period  Weeks    Status  On-going      PT SHORT TERM GOAL #4   Title  Patient to be compliant with appropriate HEP, to be updated PRN     Baseline  6/26- compliant, wife assist     Time  1    Period  Weeks    Status  Achieved        PT Long Term Goals - 03/16/18 1036      PT LONG TERM GOAL #1   Title  Patient to demonstrate R shoulder strength as being at least 4/5 in all tested groups in order to show improving functional strength and to reduce pain     Baseline  6/26-  ongoing     Time  8    Period  Weeks    Status  On-going      PT LONG TERM GOAL #2   Title  Patient to be able to reach overhead with R shoulder without scapular winging or upper trap compensation pattern in order to show improved shoulder mechanics     Baseline  6/26- ongoing     Time  8    Period  Weeks    Status  On-going      PT LONG TERM GOAL #3   Title  Patient to report pain as being no more than 2/10 R shoulder in order to improve QOL and functional task tolerance     Baseline  6/26- 5/10     Time  8    Period  Weeks    Status  On-going      PT LONG TERM GOAL #4   Title  Patient to be able to perform all self care tasks with R shoulder independently with no increase in pain in order to show improved self-efficacy     Baseline  6/26- ongoing     Time  8    Period  Weeks    Status  On-going            Plan - 03/21/18 1057    Clinical Impression Statement  Continued with PROM/AAROM as tolerated and as per protocol today; patient continues to have pain but is doing well, and has complied with PT advice to return to sling until cleared by MD/rest R UE to allow for healing of tissues. He is now 6 weeks following surgery on R UE; plan to progress patient moving forward as able and tolerated.     Rehab Potential  Excellent    PT Frequency  2x / week    PT Duration  8 weeks    PT Treatment/Interventions  ADLs/Self Care Home Management;Biofeedback;Cryotherapy;Electrical Stimulation;Iontophoresis 4mg /ml Dexamethasone;Moist  Heat;Traction;Ultrasound;Functional mobility training;Therapeutic activities;Therapeutic exercise;Balance training;Neuromuscular re-education;Patient/family education;Manual techniques;Scar mobilization;Passive range of motion;Dry needling;Energy conservation;Taping    PT Next Visit Plan  continue to progress per protocol. When is his surgery scheduled for L shoulder? Teach wife AAROM for R shoulder. Week 6 on 03/22/18.     PT Home Exercise Plan  Eval: grip  squeezes, wrist flexion/extension, elbow flexion/extension, horizontal and vertical PROM pendulums standing     Consulted and Agree with Plan of Care  Patient       Patient will benefit from skilled therapeutic intervention in order to improve the following deficits and impairments:  Decreased skin integrity, Increased fascial restricitons, Pain, Decreased mobility, Decreased scar mobility, Increased muscle spasms, Postural dysfunction, Decreased activity tolerance, Decreased range of motion, Decreased strength, Hypomobility, Impaired perceived functional ability, Impaired UE functional use, Decreased safety awareness, Impaired flexibility  Visit Diagnosis: Stiffness of right shoulder, not elsewhere classified  Acute pain of right shoulder  Muscle weakness (generalized)  Abnormal posture     Problem List Patient Active Problem List   Diagnosis Date Noted  . 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 Smoker 06/07/2014  . GERD (gastroesophageal reflux disease) 06/07/2014  . Hemorrhoid 05/27/2014    Deniece Ree PT, DPT, CBIS  Supplemental Physical Therapist [place]   Pager [place] [facility] [address] [place], [place], [address] Phone: [phone]   Fax:  [phone]  Name: Joevon [place] MRN: [id number] Date of Birth: 11/14/1955

## 2018-03-23 ENCOUNTER — Ambulatory Visit: Payer: Commercial Managed Care - PPO | Admitting: Physical Therapy

## 2018-03-23 ENCOUNTER — Encounter: Payer: Self-pay | Admitting: Physical Therapy

## 2018-03-23 DIAGNOSIS — R293 Abnormal posture: Secondary | ICD-10-CM

## 2018-03-23 DIAGNOSIS — M25611 Stiffness of right shoulder, not elsewhere classified: Secondary | ICD-10-CM

## 2018-03-23 DIAGNOSIS — M6281 Muscle weakness (generalized): Secondary | ICD-10-CM

## 2018-03-23 DIAGNOSIS — M25511 Pain in right shoulder: Secondary | ICD-10-CM

## 2018-03-23 NOTE — Therapy (Signed)
[place], [place], [address] Phone: [phone]   Fax:  [phone]  Physical Therapy Treatment  Patient Details  Name: Joshua Stephenson MRN: [id number] Date of Birth: 09/27/1955 Referring Provider: Dorothyann Peng    Encounter Date: 03/23/2018  PT End of Session - 03/23/18 1717    Visit Number  11    Number of Visits  20    Date for PT Re-Evaluation  04/13/18    Authorization Type  UMR/UHC PPO     Authorization Time Period  02/16/18 to 04/18/18    PT Start Time  1643 patient a few minutes late/ice not included in billing     PT Stop Time  1714    PT Time Calculation (min)  31 min    Activity Tolerance  Patient tolerated treatment well    Behavior During Therapy  [facility] for tasks assessed/performed       Past Medical History:  Diagnosis Date  . Anxiety   . Asthma    as a child  . 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 GERD (gastroesophageal reflux disease)   . Hemorrhoids     Past Surgical History:  Procedure Laterality Date  . COLONOSCOPY  2011  . SHOULDER ARTHROSCOPY W/ ROTATOR CUFF REPAIR Right 02/08/2018    There were no vitals filed for this visit.  Subjective Assessment - 03/23/18 1637    Subjective  I am doing OK, I should have heard sometihng about that surgery by next time I come. My shoulder is still sore, just a nagging pain in that cuff. I've not called the MD about when I can come out of my sling yet.     Patient Stated Goals  reduce pain, be able to go back to work     Currently in Pain?  Yes    Pain Score  6     Pain Location  Shoulder    Pain Orientation  Right    Pain Descriptors / Indicators  Aching;Sore;Nagging    Pain Type  Surgical pain                       [facility] Adult PT Treatment/Exercise - 03/23/18 0001      Shoulder Exercises: Supine   Flexion  PROM;15 reps;AAROM;20 reps    ABduction  PROM;15 reps;20 reps      Cryotherapy   Number Minutes  Cryotherapy  8 Minutes not included in billing     Cryotherapy Location  Shoulder    Type of Cryotherapy  Ice pack      Manual Therapy   Manual Therapy  Soft tissue mobilization    Manual therapy comments  separate from all other skilled services     Soft tissue mobilization  R middle delt and tricep              PT Education - 03/23/18 1716    Education provided  Yes    Education Details  f/u with MD regarding sling wear, increase icing protocol at home to asisst with pain     Person(s) Educated  Patient    Methods  Explanation    Comprehension  Verbalized understanding       PT Short Term Goals - 03/16/18 1034      PT SHORT TERM GOAL #1   Title  Patient to demonstrate R shoulder PROM as being full on all measured planes in order to show improved shoulder mobility  Baseline  6/26- progressing well, limited by protocol     Time  4    Period  Weeks    Status  On-going      PT SHORT TERM GOAL #2   Title  Patient to demonstrate R shoulder AROM as being full on all measured planes in order to show improved shoulder mobility and tolerance to activity     Baseline  6/26- initiated today, ongoing     Time  4    Period  Weeks    Status  On-going      PT SHORT TERM GOAL #3   Title  Patient to be able to maintain correct functional posture at least 75% of the time in order to assist in maintaining correct shoulder mechanics and reducing pain     Baseline  6/26- ongoing    Time  4    Period  Weeks    Status  On-going      PT SHORT TERM GOAL #4   Title  Patient to be compliant with appropriate HEP, to be updated PRN     Baseline  6/26- compliant, wife assist     Time  1    Period  Weeks    Status  Achieved        PT Long Term Goals - 03/16/18 1036      PT LONG TERM GOAL #1   Title  Patient to demonstrate R shoulder strength as being at least 4/5 in all tested groups in order to show improving functional strength and to reduce pain     Baseline  6/26- ongoing      Time  8    Period  Weeks    Status  On-going      PT LONG TERM GOAL #2   Title  Patient to be able to reach overhead with R shoulder without scapular winging or upper trap compensation pattern in order to show improved shoulder mechanics     Baseline  6/26- ongoing     Time  8    Period  Weeks    Status  On-going      PT LONG TERM GOAL #3   Title  Patient to report pain as being no more than 2/10 R shoulder in order to improve QOL and functional task tolerance     Baseline  6/26- 5/10     Time  8    Period  Weeks    Status  On-going      PT LONG TERM GOAL #4   Title  Patient to be able to perform all self care tasks with R shoulder independently with no increase in pain in order to show improved self-efficacy     Baseline  6/26- ongoing     Time  8    Period  Weeks    Status  On-going            Plan - 03/23/18 1717    Clinical Impression Statement  Patient arrives with ongoing R shoulder pain today; began with PROM to right shoulder, however this was limited by significant pain with PROM and difficulty tolerating task, thus applied ice (not included in billing), prior to further interventions today. Noted ongoing muscle fasciculation in R UE down to forearm and hand this session. All activities performed within protocol and as tolerated today, continued to encourage patient to contact MD regarding sling use/duration.     Rehab Potential  Excellent    PT Frequency  2x /  week    PT Duration  8 weeks    PT Treatment/Interventions  ADLs/Self Care Home Management;Biofeedback;Cryotherapy;Electrical Stimulation;Iontophoresis 4mg /ml Dexamethasone;Moist Heat;Traction;Ultrasound;Functional mobility training;Therapeutic activities;Therapeutic exercise;Balance training;Neuromuscular re-education;Patient/family education;Manual techniques;Scar mobilization;Passive range of motion;Dry needling;Energy conservation;Taping    PT Next Visit Plan  continue to progress per protocol. When is his  surgery scheduled for L shoulder? Teach wife AAROM for R shoulder before he has surgery. Week 6 on 03/22/18.     PT Home Exercise Plan  Eval: grip squeezes, wrist flexion/extension, elbow flexion/extension, horizontal and vertical PROM pendulums standing     Consulted and Agree with Plan of Care  Patient       Patient will benefit from skilled therapeutic intervention in order to improve the following deficits and impairments:  Decreased skin integrity, Increased fascial restricitons, Pain, Decreased mobility, Decreased scar mobility, Increased muscle spasms, Postural dysfunction, Decreased activity tolerance, Decreased range of motion, Decreased strength, Hypomobility, Impaired perceived functional ability, Impaired UE functional use, Decreased safety awareness, Impaired flexibility  Visit Diagnosis: Stiffness of right shoulder, not elsewhere classified  Acute pain of right shoulder  Muscle weakness (generalized)  Abnormal posture     Problem List Patient Active Problem List   Diagnosis Date Noted  . 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 Smoker 06/07/2014  . GERD (gastroesophageal reflux disease) 06/07/2014  . Hemorrhoid 05/27/2014    Deniece Ree PT, DPT, CBIS  Supplemental Physical Therapist [place]   Pager [place] [facility] [address] [place], [place], [address] Phone: [phone]   Fax:  [phone]  Name: Ysmael Hires MRN: [id number] Date of Birth: 01-27-1956

## 2018-03-30 ENCOUNTER — Ambulatory Visit: Payer: Commercial Managed Care - PPO | Admitting: Physical Therapy

## 2018-03-30 ENCOUNTER — Encounter: Payer: Self-pay | Admitting: Physical Therapy

## 2018-03-30 DIAGNOSIS — M25611 Stiffness of right shoulder, not elsewhere classified: Secondary | ICD-10-CM

## 2018-03-30 DIAGNOSIS — M6281 Muscle weakness (generalized): Secondary | ICD-10-CM

## 2018-03-30 DIAGNOSIS — M25511 Pain in right shoulder: Secondary | ICD-10-CM

## 2018-03-30 DIAGNOSIS — R293 Abnormal posture: Secondary | ICD-10-CM

## 2018-03-30 NOTE — Therapy (Signed)
[place], [place], [address] Phone: [phone]   Fax:  [phone]  Physical Therapy Treatment  Patient Details  Name: Joshua Stephenson MRN: [id number] Date of Birth: 1956-04-22 Referring Provider: Dorothyann Peng    Encounter Date: 03/30/2018  PT End of Session - 03/30/18 1620    Visit Number  12    Number of Visits  20    Date for PT Re-Evaluation  04/13/18    Authorization Type  [employer] PPO     Authorization Time Period  02/16/18 to 04/18/18    PT Start Time  1423 heat not included in billing     PT Stop Time  1500    PT Time Calculation (min)  37 min    Activity Tolerance  Patient tolerated treatment well    Behavior During Therapy  [facility] for tasks assessed/performed       Past Medical History:  Diagnosis Date  . Anxiety   . Asthma    as a child  . 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 GERD (gastroesophageal reflux disease)   . Hemorrhoids     Past Surgical History:  Procedure Laterality Date  . COLONOSCOPY  2011  . SHOULDER ARTHROSCOPY W/ ROTATOR CUFF REPAIR Right 02/08/2018    There were no vitals filed for this visit.  Subjective Assessment - 03/30/18 1418    Subjective  I talked with the surgeon, he told me I can start to wean out of the sling  but I still need to be in part of the day; the surgeon also said he has my images and he will be in touch with me this week. I was really sore after last session but heat/hot water has helped.     Patient Stated Goals  reduce pain, be able to go back to work     Currently in Pain?  Yes    Pain Score  4     Pain Location  Shoulder    Pain Orientation  Right    Pain Descriptors / Indicators  Sore    Pain Type  Surgical pain    Pain Radiating Towards  none    Pain Onset  More than a month ago    Pain Frequency  Constant    Aggravating Factors   laying down, lifting items     Pain Relieving Factors  ice and heat     Effect of Pain on Daily  Activities  severe                        [facility] Adult PT Treatment/Exercise - 03/30/18 0001      Shoulder Exercises: Supine   External Rotation  Right;PROM;AAROM;15 reps to 90 degrees at 90 degrees ABD     Flexion  Right;PROM;AAROM;15 reps    ABduction  Right;PROM;AAROM;15 reps      Shoulder Exercises: Seated   External Rotation  AAROM;Right;15 reps;AROM;10 reps      Modalities   Modalities  Moist Heat      Moist Heat Therapy   Number Minutes Moist Heat  8 Minutes not included in billing     Moist Heat Location  Shoulder      Cryotherapy   Number Minutes Cryotherapy  -- not included in billing      Manual Therapy   Manual Therapy  Soft tissue mobilization    Manual therapy comments  separate from all other skilled services  Soft tissue mobilization  R anterior shoulder/biceps complex/scar massage          Flexion and ABD AROM 1x10, supine, min guard from PT     PT Education - 03/30/18 1619    Education provided  Yes    Education Details  please get on wait list so we can try to see him at least 1 more time this week as he may have surgery next week, progessions with PT today     Person(s) Educated  Patient    Methods  Explanation    Comprehension  Verbalized understanding       PT Short Term Goals - 03/16/18 1034      PT SHORT TERM GOAL #1   Title  Patient to demonstrate R shoulder PROM as being full on all measured planes in order to show improved shoulder mobility     Baseline  6/26- progressing well, limited by protocol     Time  4    Period  Weeks    Status  On-going      PT SHORT TERM GOAL #2   Title  Patient to demonstrate R shoulder AROM as being full on all measured planes in order to show improved shoulder mobility and tolerance to activity     Baseline  6/26- initiated today, ongoing     Time  4    Period  Weeks    Status  On-going      PT SHORT TERM GOAL #3   Title  Patient to be able to maintain correct functional posture at  least 75% of the time in order to assist in maintaining correct shoulder mechanics and reducing pain     Baseline  6/26- ongoing    Time  4    Period  Weeks    Status  On-going      PT SHORT TERM GOAL #4   Title  Patient to be compliant with appropriate HEP, to be updated PRN     Baseline  6/26- compliant, wife assist     Time  1    Period  Weeks    Status  Achieved        PT Long Term Goals - 03/16/18 1036      PT LONG TERM GOAL #1   Title  Patient to demonstrate R shoulder strength as being at least 4/5 in all tested groups in order to show improving functional strength and to reduce pain     Baseline  6/26- ongoing     Time  8    Period  Weeks    Status  On-going      PT LONG TERM GOAL #2   Title  Patient to be able to reach overhead with R shoulder without scapular winging or upper trap compensation pattern in order to show improved shoulder mechanics     Baseline  6/26- ongoing     Time  8    Period  Weeks    Status  On-going      PT LONG TERM GOAL #3   Title  Patient to report pain as being no more than 2/10 R shoulder in order to improve QOL and functional task tolerance     Baseline  6/26- 5/10     Time  8    Period  Weeks    Status  On-going      PT LONG TERM GOAL #4   Title  Patient to be able to perform all self care  tasks with R shoulder independently with no increase in pain in order to show improved self-efficacy     Baseline  6/26- ongoing     Time  8    Period  Weeks    Status  On-going            Plan - 03/30/18 1622    Clinical Impression Statement  Patient arrives today reporting his surgeon has told him he can begin to wean out of his sling but still needs to wear it part of the day; he also states that he should hear about the date of his next surgery sometime this week. Continued with PROM, now full on all planes, otherwise continued to progress AAROM and introduced gentle AROM this session as well. Patient doing well thus far.     Rehab  Potential  Excellent    PT Frequency  2x / week    PT Duration  8 weeks    PT Treatment/Interventions  ADLs/Self Care Home Management;Biofeedback;Cryotherapy;Electrical Stimulation;Iontophoresis 4mg /ml Dexamethasone;Moist Heat;Traction;Ultrasound;Functional mobility training;Therapeutic activities;Therapeutic exercise;Balance training;Neuromuscular re-education;Patient/family education;Manual techniques;Scar mobilization;Passive range of motion;Dry needling;Energy conservation;Taping    PT Next Visit Plan  continue to progress per protocol. When is his surgery scheduled for L shoulder? Teach wife AAROM for R shoulder before he has surgery. Week 6 on 03/22/18. Update HEP     PT Home Exercise Plan  Eval: grip squeezes, wrist flexion/extension, elbow flexion/extension, horizontal and vertical PROM pendulums standing     Consulted and Agree with Plan of Care  Patient       Patient will benefit from skilled therapeutic intervention in order to improve the following deficits and impairments:  Decreased skin integrity, Increased fascial restricitons, Pain, Decreased mobility, Decreased scar mobility, Increased muscle spasms, Postural dysfunction, Decreased activity tolerance, Decreased range of motion, Decreased strength, Hypomobility, Impaired perceived functional ability, Impaired UE functional use, Decreased safety awareness, Impaired flexibility  Visit Diagnosis: Stiffness of right shoulder, not elsewhere classified  Acute pain of right shoulder  Muscle weakness (generalized)  Abnormal posture     Problem List Patient Active Problem List   Diagnosis Date Noted  . 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 Smoker 06/07/2014  . GERD (gastroesophageal reflux disease) 06/07/2014  . Hemorrhoid 05/27/2014    Deniece Ree PT, DPT, CBIS  Supplemental Physical Therapist [place]   Pager [facility] [address] [place],  [place], [address] Phone: [phone]   Fax:  [phone]  Name: Joshua Stephenson MRN: [id number] Date of Birth: [birth year]

## 2018-04-04 ENCOUNTER — Ambulatory Visit (INDEPENDENT_AMBULATORY_CARE_PROVIDER_SITE_OTHER): Payer: Commercial Managed Care - PPO | Admitting: Internal Medicine

## 2018-04-04 ENCOUNTER — Other Ambulatory Visit (INDEPENDENT_AMBULATORY_CARE_PROVIDER_SITE_OTHER): Payer: Commercial Managed Care - PPO

## 2018-04-04 ENCOUNTER — Encounter: Payer: Self-pay | Admitting: Internal Medicine

## 2018-04-04 VITALS — BP 112/70 | HR 80 | Ht 67.25 in | Wt 160.1 lb

## 2018-04-04 DIAGNOSIS — R1031 Right lower quadrant pain: Secondary | ICD-10-CM

## 2018-04-04 DIAGNOSIS — R197 Diarrhea, unspecified: Secondary | ICD-10-CM | POA: Diagnosis not present

## 2018-04-04 DIAGNOSIS — R1032 Left lower quadrant pain: Secondary | ICD-10-CM

## 2018-04-04 DIAGNOSIS — R10819 Abdominal tenderness, unspecified site: Secondary | ICD-10-CM

## 2018-04-04 LAB — CBC WITH DIFFERENTIAL/PLATELET
Basophils Absolute: 0.1 10*3/uL (ref 0.0–0.1)
Basophils Relative: 0.8 % (ref 0.0–3.0)
EOS PCT: 1.7 % (ref 0.0–5.0)
Eosinophils Absolute: 0.1 10*3/uL (ref 0.0–0.7)
HCT: 40.5 % (ref 39.0–52.0)
HEMOGLOBIN: 14.2 g/dL (ref 13.0–17.0)
Lymphocytes Relative: 33.2 % (ref 12.0–46.0)
Lymphs Abs: 2.1 10*3/uL (ref 0.7–4.0)
MCHC: 35.1 g/dL (ref 30.0–36.0)
MCV: 83.9 fl (ref 78.0–100.0)
MONOS PCT: 20.7 % — AB (ref 3.0–12.0)
Monocytes Absolute: 1.3 10*3/uL — ABNORMAL HIGH (ref 0.1–1.0)
Neutro Abs: 2.8 10*3/uL (ref 1.4–7.7)
Neutrophils Relative %: 43.6 % (ref 43.0–77.0)
Platelets: 279 10*3/uL (ref 150.0–400.0)
RBC: 4.83 Mil/uL (ref 4.22–5.81)
RDW: 14.9 % (ref 11.5–15.5)
WBC: 6.4 10*3/uL (ref 4.0–10.5)

## 2018-04-04 MED ORDER — DICYCLOMINE HCL 20 MG PO TABS: 20.0000 mg | ORAL_TABLET | Freq: Three times a day (TID) | ORAL | 0 refills | Status: DC

## 2018-04-04 NOTE — Patient Instructions (Signed)
  We have sent the following medications to your pharmacy for you to pick up at your convenience: [place] provider has requested that you go to the basement level for lab work before leaving today. Press "B" on the elevator. The lab is located at the first door on the left as you exit the elevator.   We will obtain your colonoscopy report from [facility].    I appreciate the opportunity to care for you. Silvano Rusk, MD, [facility]

## 2018-04-04 NOTE — Progress Notes (Signed)
Joshua Stephenson 62 y.o. 06-06-1956 [id number]  Referred by: Dorothyann Peng, NP [name], [place] [address]  Assessment & Plan:   Encounter Diagnoses  Name Primary?  . Watery diarrhea Yes  . Bilateral lower abdominal pain   . Lower abdominal tenderness    Start work-up with C diff PCR and GI pathohgen panel  Seems like a change after his trip to [place] so infection seems likely. Once these studies back will determine next step - the C diff PCR toxin is negative.  ROI [facility] to get prior colonoscopy and and any other records.  I appreciate the opportunity to care for this patient. UU:[id number], Tommi Rumps, NP    Subjective:   Chief Complaint: Lower abdominal pain and diarrhea HPI The patient is here w/ c/o of chronic intermittent lower abdominal pains and loose stools with intensified symptoms in past 4- 6 weeks. He had right shoulder surgery in [place] 02/08/2018 - that went well and he is in PT. He is going back to [place] in 6 days to have left shoulder surgery (these surgeries have been arranged by his employer-sponsored health care plan). He did not have increased stools and looseness over baseline until he came back - seems gradual. No bleeding, no fever. Stools watery. HE was in a hotel before hospital and then after for a couple days and then home on a plane.  He had an investigation of GI sxs at [facility] years ago though records not here at time of visit. He does not recall any specific diagnosis. Last labs in Jan - NL CBC, CMET at PCP No FHx IBD or colon cancer. No Known Allergies Current Meds  Medication Sig  . amitriptyline (ELAVIL) 25 MG tablet Take 25 mg by mouth at bedtime.  . Esomeprazole Magnesium (NEXIUM PO) Take by mouth.  . naproxen (NAPROSYN) 500 MG tablet Take 1 tablet (500 mg total) by mouth 2 (two) times daily with a meal.  . traMADol (ULTRAM) 50 MG tablet 1-2 tabs po tid prn pain for chronic shoulder pain this is thirty  day supply  . varenicline (CHANTIX) 1 MG tablet Take 1 tablet (1 mg total) by mouth 2 (two) times daily.   Past Medical History:  Diagnosis Date  . Anxiety   . Asthma    as a child  . 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 GERD (gastroesophageal reflux disease)   . Hemorrhoids    Past Surgical History:  Procedure Laterality Date  . COLONOSCOPY  2011  . SHOULDER ARTHROSCOPY W/ ROTATOR CUFF REPAIR Right 02/08/2018   Social History   Social History Narrative   Married   1 grown daughter (prior relationship I think) [place] [address]    He likes to be outside.    Works at [employer] for [employer]   Former smoker, no EtOH and no caffeine or drugs      family history includes Breast cancer in his mother and other; Cancer in his brother, brother, and sister; Diabetes in his father; Heart disease in his father; Hypertension in his father and mother.   Review of Systems All other ROS negative - except L shoulder sxs> Right which is improving s/p surgery  Objective:   Physical Exam @BP  112/70 (BP Location: Left Arm, Patient Position: Sitting, Cuff Size: Normal)   Pulse 80   Ht 5' 7.25" (1.708 m) Comment: height measured without shoes  Wt 160 lb 2 oz (72.6 kg)  BMI 24.89 kg/m @  General:  Well-developed, well-nourished and in no acute distress Eyes:  anicteric. ENT:   Mouth and posterior pharynx free of lesions.  Neck:   supple w/o thyromegaly or mass.  Lungs: Clear to auscultation bilaterally. Heart:  S1S2, no rubs, murmurs, gallops. Abdomen:  soft, mildly tender BLQ's, no hepatosplenomegaly, hernia, or mass and BS+.  Rectal: deferred Lymph:  no cervical or supraclavicular adenopathy. Extremities:   no edema, cyanosis or clubbing Skin   no rash. Neuro:  A&O x 3.  Psych:  appropriate mood and  Affect.   Data Reviewed: See HPI

## 2018-04-05 ENCOUNTER — Other Ambulatory Visit: Payer: Commercial Managed Care - PPO

## 2018-04-05 DIAGNOSIS — R197 Diarrhea, unspecified: Secondary | ICD-10-CM

## 2018-04-05 DIAGNOSIS — R1032 Left lower quadrant pain: Secondary | ICD-10-CM

## 2018-04-05 DIAGNOSIS — R1031 Right lower quadrant pain: Secondary | ICD-10-CM

## 2018-04-05 DIAGNOSIS — R10819 Abdominal tenderness, unspecified site: Secondary | ICD-10-CM

## 2018-04-06 ENCOUNTER — Ambulatory Visit: Payer: Commercial Managed Care - PPO | Admitting: Physical Therapy

## 2018-04-06 ENCOUNTER — Encounter: Payer: Self-pay | Admitting: Internal Medicine

## 2018-04-06 DIAGNOSIS — M6281 Muscle weakness (generalized): Secondary | ICD-10-CM

## 2018-04-06 DIAGNOSIS — M25611 Stiffness of right shoulder, not elsewhere classified: Secondary | ICD-10-CM

## 2018-04-06 DIAGNOSIS — R293 Abnormal posture: Secondary | ICD-10-CM

## 2018-04-06 DIAGNOSIS — M25511 Pain in right shoulder: Secondary | ICD-10-CM

## 2018-04-06 NOTE — Therapy (Signed)
[place], [place], [address] Phone: [phone]   Fax:  [phone]  Physical Therapy Treatment  Patient Details  Name: Joshua Stephenson MRN: [id number] Date of Birth: 13-Dec-1955 Referring Provider: Dorothyann Peng    Encounter Date: 04/06/2018  PT End of Session - 04/06/18 1434    Visit Number  13    Number of Visits  20    Date for PT Re-Evaluation  04/13/18    Authorization Type  [employer] PPO     Authorization Time Period  02/16/18 to 04/18/18    PT Start Time  1339    PT Stop Time  1400    PT Time Calculation (min)  21 min    Activity Tolerance  -- see assessment     Behavior During Therapy  [facility] for tasks assessed/performed       Past Medical History:  Diagnosis Date  . Anxiety   . Asthma    as a child  . 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 GERD (gastroesophageal reflux disease)   . Hemorrhoids     Past Surgical History:  Procedure Laterality Date  . COLONOSCOPY  2011  . SHOULDER ARTHROSCOPY W/ ROTATOR CUFF REPAIR Right 02/08/2018    There were no vitals filed for this visit.  Subjective Assessment - 04/06/18 1410    Subjective  The patient patient reports his shoulder has been hurting very bad both day and night. He reports he is feeling hopeless. He reports his company is pushing him to get surgery on his other shoulder before he has full function of his right arm. He reports he feels like he should just take his pain pills and end it. Therapy will proceed with cuicide screen. Patients wife is present.     How long can you sit comfortably?  6/26- riding in a car, about an hour     How long can you stand comfortably?  6/26- no limits in sling     How long can you walk comfortably?  6/26- no limits in sling     Patient Stated Goals  reduce pain, be able to go back to work     Currently in Pain?  Yes    Pain Score  6     Pain Orientation  Right    Pain Descriptors / Indicators  Aching     Pain Type  Chronic pain    Pain Onset  More than a month ago    Pain Frequency  Constant    Aggravating Factors   laying down, lifting items     Pain Relieving Factors  ice and heat     Effect of Pain on Daily Activities  severe                               PT Education - 04/06/18 1432    Education provided  Yes    Education Details  advised patient to go to the ED for couseling and to have his shoulder looked at by an MD    Person(s) Educated  Patient;Spouse    Methods  Explanation;Demonstration;Tactile cues;Verbal cues    Comprehension  Verbalized understanding;Returned demonstration;Tactile cues required;Verbal cues required       PT Short Term Goals - 03/16/18 1034      PT SHORT TERM GOAL #1   Title  Patient to demonstrate R shoulder PROM as being full on all measured planes  in order to show improved shoulder mobility     Baseline  6/26- progressing well, limited by protocol     Time  4    Period  Weeks    Status  On-going      PT SHORT TERM GOAL #2   Title  Patient to demonstrate R shoulder AROM as being full on all measured planes in order to show improved shoulder mobility and tolerance to activity     Baseline  6/26- initiated today, ongoing     Time  4    Period  Weeks    Status  On-going      PT SHORT TERM GOAL #3   Title  Patient to be able to maintain correct functional posture at least 75% of the time in order to assist in maintaining correct shoulder mechanics and reducing pain     Baseline  6/26- ongoing    Time  4    Period  Weeks    Status  On-going      PT SHORT TERM GOAL #4   Title  Patient to be compliant with appropriate HEP, to be updated PRN     Baseline  6/26- compliant, wife assist     Time  1    Period  Weeks    Status  Achieved        PT Long Term Goals - 03/16/18 1036      PT LONG TERM GOAL #1   Title  Patient to demonstrate R shoulder strength as being at least 4/5 in all tested groups in order to show  improving functional strength and to reduce pain     Baseline  6/26- ongoing     Time  8    Period  Weeks    Status  On-going      PT LONG TERM GOAL #2   Title  Patient to be able to reach overhead with R shoulder without scapular winging or upper trap compensation pattern in order to show improved shoulder mechanics     Baseline  6/26- ongoing     Time  8    Period  Weeks    Status  On-going      PT LONG TERM GOAL #3   Title  Patient to report pain as being no more than 2/10 R shoulder in order to improve QOL and functional task tolerance     Baseline  6/26- 5/10     Time  8    Period  Weeks    Status  On-going      PT LONG TERM GOAL #4   Title  Patient to be able to perform all self care tasks with R shoulder independently with no increase in pain in order to show improved self-efficacy     Baseline  6/26- ongoing     Time  8    Period  Weeks    Status  On-going            Plan - 04/06/18 1440    Clinical Impression Statement  Patient identifid as positiva and in immediate danger through the suicide screen. The patients wife was present. They were advised to go to the ED for follow up for suicidal ideations as well as to follow up as to why his shoulder is still hurting him this bad. Therapy perfromed passive ROM on the patients shoulder and soft tissue mobilization prior to patient verbalizing suicidal thoughts. Patient had significnat sensativity to light touch of the anterior  shoulder and posterior shoulder. He had in past visits been making good progress. The patient feels like he is under great pressure from his job to have the second surgery done depsite not having functional use of his right shoulder. He reportshe feels like he will be fired if he dosent have the surgery. The patients wife reports she will take him right to the ED to be checked out.     Clinical Presentation  Evolving    Clinical Decision Making  Low    Rehab Potential  Excellent    PT Frequency  2x /  week    PT Duration  8 weeks    PT Treatment/Interventions  ADLs/Self Care Home Management;Biofeedback;Cryotherapy;Electrical Stimulation;Iontophoresis 4mg /ml Dexamethasone;Moist Heat;Traction;Ultrasound;Functional mobility training;Therapeutic activities;Therapeutic exercise;Balance training;Neuromuscular re-education;Patient/family education;Manual techniques;Scar mobilization;Passive range of motion;Dry needling;Energy conservation;Taping    PT Next Visit Plan  Patient reports he is to have surgery in 7 days. He was strongly advised to have his right shoulder looked at before having his second shoulder operated on. he was sent to the ED.     PT Home Exercise Plan  Eval: grip squeezes, wrist flexion/extension, elbow flexion/extension, horizontal and vertical PROM pendulums standing     Consulted and Agree with Plan of Care  Patient       Patient will benefit from skilled therapeutic intervention in order to improve the following deficits and impairments:  Decreased skin integrity, Increased fascial restricitons, Pain, Decreased mobility, Decreased scar mobility, Increased muscle spasms, Postural dysfunction, Decreased activity tolerance, Decreased range of motion, Decreased strength, Hypomobility, Impaired perceived functional ability, Impaired UE functional use, Decreased safety awareness, Impaired flexibility  Visit Diagnosis: Stiffness of right shoulder, not elsewhere classified  Acute pain of right shoulder  Muscle weakness (generalized)  Abnormal posture     Problem List Patient Active Problem List   Diagnosis Date Noted  . 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 Smoker 06/07/2014  . GERD (gastroesophageal reflux disease) 06/07/2014  . Hemorrhoid 05/27/2014    Carney Living PT DPT  04/06/2018, 4:21 PM  [facility] [address] [place], [place], [address] Phone: [phone]   Fax:  [phone]  Name: Rajinder Mesick MRN: [id number] Date of Birth: 04-04-1956

## 2018-04-06 NOTE — Patient Instructions (Signed)
Screening for Suicide  Answer the following questions with Yes or No and place an "x" beside the action taken.  1. Over the past two weeks, have you felt down, depressed, or hopeless?   Depressed   2. Within the past two weeks, have you felt little interest or pleasure in life?    If YES to either #1 or #2, then ask #3  3. Have you had thoughts that that life is not worth living or that you might be       better off dead?     If answer is NO and suspicion is low, then end yes    4. Over this past week, have you had any thoughts about hurting or even killing yourself?    If NO, then end. Patient in no immediate danger yes    5. If so, do you believe that you intend to or will harm yourself?  Yes      If NO, then end. Patient in no immediate danger   6.  Do you have a plan as to how you would hurt yourself?  Yes Patient is going to take all of his pain pills    7.  Over this past week, have you actually done anything to hurt yourself? The Patient has thought about taking all of his pain pills    IF YES answers to either #4, #5, #6 or #7, then patient is AT RISK for suicide   Actions Taken  ____  Screening negative; no further action required  ____  Screening positive; no immediate danger and patient already in treatment with a  mental health provider. Advise patient to speak to their mental health provider.  ____  Screening positive; no immediate danger. Patient advised to contact a mental  health provider for further assessment.  x ____  Screening positive; in immediate danger as patient states intention of killing self,  has plan and a sense of imminence. Do not leave alone. Seek permission from  patient to contact a family member to inform them. Direct patient to go to ED.

## 2018-04-07 ENCOUNTER — Telehealth: Payer: Self-pay | Admitting: Physical Therapy

## 2018-04-07 ENCOUNTER — Other Ambulatory Visit: Payer: Self-pay | Admitting: Adult Health

## 2018-04-07 ENCOUNTER — Telehealth: Payer: Self-pay | Admitting: Adult Health

## 2018-04-07 LAB — GASTROINTESTINAL PATHOGEN PANEL PCR
C. DIFFICILE TOX A/B, PCR: NOT DETECTED
Campylobacter, PCR: NOT DETECTED
Cryptosporidium, PCR: NOT DETECTED
E COLI (ETEC) LT/ST, PCR: NOT DETECTED
E COLI (STEC) STX1/STX2, PCR: NOT DETECTED
E COLI 0157, PCR: NOT DETECTED
Giardia lamblia, PCR: NOT DETECTED
NOROVIRUS, PCR: NOT DETECTED
Rotavirus A, PCR: NOT DETECTED
Salmonella, PCR: NOT DETECTED
Shigella, PCR: NOT DETECTED

## 2018-04-07 LAB — CLOSTRIDIUM DIFFICILE TOXIN B, QUALITATIVE, REAL-TIME PCR: Toxigenic C. Difficile by PCR: NOT DETECTED

## 2018-04-07 NOTE — Telephone Encounter (Signed)
Received a note from physical therapy yesterday that patient was suicidal due to pain and having to have another surgery.  He reported that he thought about just taking all of his pain medications to "end it all".  He was advised to go the emergency room, which is wife reported to physical therapy that she was going to do.  From my and I have tried calling multiple times to both she and his wife.  When calling the patient reports that his phone number has been changed and when calling his wife to go straight to voicemail but unable to leave message at this time.  We will continue to try to get a hold of his wife

## 2018-04-07 NOTE — Telephone Encounter (Signed)
Able to locate another phone number associated with patient [phone]) and spoke to Trina Ao, patient's wife.   Helene Kelp states that she didn't know that Mr. Farrington was feeling this depressed and she was as shocked as therapy staff to hear these suicidal thoughts. She reports they sat and talked after PT last time and he did not feel that he needed to go to the ED, but she has been keeping a close eye on him and checking in on him regularly. She says that he has been very active with a project at church recently and while he is still definitely depressed, this seems to be helping his mood a little.   Discussed that if patient should express further feelings of hopelessness/despair or suicidal ideations, that she should immediately take him to the ED for care for mental health crisis/further management. Discussed importance of regularly checking in with him about how he is feeling and re-iterated taking him to the ED with any other additional suicidal ideations.   Helene Kelp expresses understanding and agreement of all education provided today, and gives permission for PT to share this and their new phone number [phone]) with Tommi Rumps, the referring clinician for this patient.   We will plan to pass this contact information onto Tommi Rumps so that he may further follow up with this patient.     Deniece Ree PT, DPT, CBIS  Supplemental Physical Therapist [facility]   Pager [phone]

## 2018-04-07 NOTE — Telephone Encounter (Signed)
Attempted follow up with patient due to patient reports of being suicidal at last PT session due to pain and concerns with next surgery. The PT who saw him last referred to the ED but we are concerned that he did not go.   I have attempted to contact both the patient and his wife, and both numbers available to me in Epic are either disconnected or do not appear to be working/unable to leave message. Will continue to attempt to follow up as able.    Deniece Ree PT, DPT, CBIS  Supplemental Physical Therapist [facility]   Pager [phone]

## 2018-04-08 NOTE — Progress Notes (Signed)
C diff and GI pathogen panel is negative

## 2018-04-12 ENCOUNTER — Ambulatory Visit: Payer: Commercial Managed Care - PPO | Admitting: Physical Therapy

## 2018-04-12 NOTE — Progress Notes (Signed)
C diff also negative  My chart message  Needs colonoscopy  Is in [place] getting shoulder surgery  He needs a colonoscopy when he returns -I asked him to contact us

## 2018-04-15 ENCOUNTER — Encounter: Payer: Commercial Managed Care - PPO | Admitting: Physical Therapy

## 2018-04-19 ENCOUNTER — Ambulatory Visit: Payer: Commercial Managed Care - PPO | Admitting: Physical Therapy

## 2018-04-19 ENCOUNTER — Other Ambulatory Visit: Payer: Self-pay

## 2018-04-19 ENCOUNTER — Encounter: Payer: Commercial Managed Care - PPO | Admitting: Physical Therapy

## 2018-04-19 ENCOUNTER — Encounter: Payer: Self-pay | Admitting: Physical Therapy

## 2018-04-19 DIAGNOSIS — M6281 Muscle weakness (generalized): Secondary | ICD-10-CM

## 2018-04-19 DIAGNOSIS — R293 Abnormal posture: Secondary | ICD-10-CM

## 2018-04-19 DIAGNOSIS — M25611 Stiffness of right shoulder, not elsewhere classified: Secondary | ICD-10-CM

## 2018-04-19 DIAGNOSIS — M25511 Pain in right shoulder: Secondary | ICD-10-CM

## 2018-04-19 NOTE — Therapy (Signed)
[place], [place], [address] Phone: [phone]   Fax:  [phone]  Physical Therapy Treatment (ERO/Recert)  Patient Details  Name: Joshua Stephenson MRN: [id number] Date of Birth: Dec 13, 1955 Referring Provider: Dorothyann Peng    Encounter Date: 04/19/2018  PT End of Session - 04/19/18 1434    Visit Number  14    Number of Visits  38    Date for PT Re-Evaluation  05/17/18    Authorization Type  [employer] PPO     Authorization Time Period  7/41/42 to 3/95/32; recert done 0/23/34    PT Start Time  1345    PT Stop Time  1420    PT Time Calculation (min)  35 min    Activity Tolerance  Patient tolerated treatment well;Patient limited by pain    Behavior During Therapy  [facility] for tasks assessed/performed       Past Medical History:  Diagnosis Date  . Anxiety   . Asthma    as a child  . COPD, mild ([place]) 06/07/2014  . GERD (gastroesophageal reflux disease)   . Hemorrhoids     Past Surgical History:  Procedure Laterality Date  . COLONOSCOPY  2011  . SHOULDER ARTHROSCOPY W/ ROTATOR CUFF REPAIR Right 02/08/2018    There were no vitals filed for this visit.  Subjective Assessment - 04/19/18 1347    Subjective  I had a good trip, the doctor thinks there's just some inflammation in my right causing my shooting pain, my images looked fine. My doctor says that I should avoid lifting anything period. My doctor is shaky if I'll be able to return to work. I was at the point before where if I cannot use my arm where I was very depressed, I've spoken to a few people and I've not had any more suicidal thoughts, I am definitely still depressed but I realize I am doing better than some people who cannot use their arm at all. I am doing better mood wise. I still have pain at night. I am only in the sling on the left for 2 weeks. The surgery on the L was done on the 22nd of July.      Patient Stated Goals  reduce pain,  be able to go back to work     Currently in Pain?  Yes    Pain Score  6     Pain Location  Shoulder    Pain Orientation  Right;Left    Pain Descriptors / Indicators  Sharp;Burning    Pain Type  Chronic pain    Pain Radiating Towards  down to 3rd and 4th digits B     Pain Onset  More than a month ago    Pain Frequency  Constant    Aggravating Factors   laying down, trying to use arms    Pain Relieving Factors  heat, warm water in shower, massage     Effect of Pain on Daily Activities  severe          [facility] PT Assessment - 04/19/18 0001      Assessment   Medical Diagnosis  s/p R shoulder RCR    Referring Provider  Dorothyann Peng     Onset Date/Surgical Date  02/08/18    Next MD Visit  [place] August 3rd     Prior Therapy  none       Precautions   Precautions  Shoulder    Precaution Comments  follow [place]  ortho specialist protocol       Restrictions   Weight Bearing Restrictions  Yes    RUE Weight Bearing  Non weight bearing      Balance Screen   Has the patient fallen in the past 6 months  No    Has the patient had a decrease in activity level because of a fear of falling?   Yes    Is the patient reluctant to leave their home because of a fear of falling?   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 Can weight up to 50#. Constantly moving/pushing pallet.     Leisure  watching football/sports, outside tasks       Sensation   Additional Comments  LTT impaired median/ulnar nerve distribution B hands       ROM / Strength   AROM / PROM / Strength  Strength      AROM   Right/Left Shoulder  Right    Right Shoulder Flexion  125 Degrees supine, pain limited     Right Shoulder ABduction   140 Degrees supine, pain limited     Right Shoulder Internal Rotation  48 Degrees supine, 70 degrees ABD     Right Shoulder External Rotation  45 Degrees supine at 70 degrees ABD       Strength   Overall Strength Comments  grossly 3-/5, pain limited                    [facility] Adult PT Treatment/Exercise - 04/19/18 0001      Shoulder Exercises: Supine   Flexion  Right;AROM;12 reps close gurading from PT     ABduction  Right;AROM;12 reps close guarding by PT     Other Supine Exercises  stretching of R shoulder into flexion and ABD as tolerated due to pain              PT Education - 04/19/18 1430    Education provided  Yes    Education Details  TBD if he will realistically be able to return to his full heavy duty job, will depend on how PT goes and how he progresses especially at the 12+ week marks, cannot guarantee at this point and agree with surgeon's advice and assessment; POC moving forward; evaluation on L shoulder to be done once we receive referral; severe weakness of R UE and role of slow but progressive AROM/eventually strengthening  as tolerated at this point       Person(s) Educated  Patient    Methods  Explanation    Comprehension  Verbalized understanding       PT Short Term Goals - 04/19/18 1437      PT SHORT TERM GOAL #1   Title  Patient to demonstrate R shoulder PROM as being full on all measured planes in order to show improved shoulder mobility     Baseline  7/30- progresing, limited by pain     Time  4    Period  Weeks    Status  Partially Met      PT SHORT TERM GOAL #2   Title  Patient to demonstrate R shoulder AROM as being full on all measured planes in order to show  improved shoulder mobility and tolerance to activity     Baseline  7/30- limited by pain     Time  4    Period  Weeks    Status  On-going      PT SHORT TERM GOAL #3   Title  Patient to be able to maintain correct functional posture at least 75% of the time in order to assist  in maintaining correct shoulder mechanics and reducing pain     Baseline  7/30- improved but somewhat limited     Time  4    Period  Weeks    Status  Partially Met      PT SHORT TERM GOAL #4   Title  Patient to be compliant with appropriate HEP, to be updated PRN     Baseline  7/30- compliant, wife assist     Time  1    Period  Weeks    Status  Achieved        PT Long Term Goals - 04/19/18 1438      PT LONG TERM GOAL #1   Title  Patient to demonstrate R shoulder strength as being at least 4/5 in all tested groups in order to show improving functional strength and to reduce pain     Baseline  7/30- roughly 2+/5 to 3-/5 pain limited by gross assessment     Time  8    Period  Weeks    Status  On-going      PT LONG TERM GOAL #2   Title  Patient to be able to reach overhead with R shoulder without scapular winging or upper trap compensation pattern in order to show improved shoulder mechanics     Baseline  7/30- DNT     Time  8    Period  Weeks    Status  On-going      PT LONG TERM GOAL #3   Title  Patient to report pain as being no more than 2/10 R shoulder in order to improve QOL and functional task tolerance     Baseline  7/30- 4-5/10     Time  8    Period  Weeks    Status  On-going      PT LONG TERM GOAL #4   Title  Patient to be able to perform all self care tasks with R shoulder independently with no increase in pain in order to show improved self-efficacy     Baseline  7/30- ongoing     Time  8    Period  Weeks    Status  On-going            Plan - 04/19/18 1435    Clinical Impression Statement  Re-assessment performed today. Patient arrives reporting that while he is still depressed, he has been able to speak with his NP and close family members which has helped and he is feeling better/has not had any further suicidal thoughts. Examination reveals some improvement in active R shoulder ROM as measured in supine, however continues to be fairly pain limited and  expresses radicular symptoms that go down to his 3rd and 4th digits. Continue to suspect possible cervical or brachial plexus involvement due to pattern of numbness in median/ulnar nerve distributions- this would require further investigation by PT moving forward. Continued with shoulder stretching and R shoulder AROM as tolerated with close guarding due to weakness and pain today. Strongly recommend continuation of skilled PT services to further progress R shoulder along  protocol as appropriate, and to initiate evaluation and care of L shoulder when appropriate referral is received.     Rehab Potential  Good    PT Frequency  Other (comment) 2-3 times per week     PT Duration  8 weeks    PT Treatment/Interventions  ADLs/Self Care Home Management;Biofeedback;Cryotherapy;Electrical Stimulation;Iontophoresis 16m/ml Dexamethasone;Moist Heat;Traction;Ultrasound;Functional mobility training;Therapeutic activities;Therapeutic exercise;Balance training;Neuromuscular re-education;Patient/family education;Manual techniques;Scar mobilization;Passive range of motion;Dry needling;Energy conservation;Taping    PT Next Visit Plan  AROM, STM, pain control R shoulder; if he has MD referral for L shoulder, eval on L. Update R shoulder HEP (consider gentle AROM)     PT Home Exercise Plan  Eval: grip squeezes, wrist flexion/extension, elbow flexion/extension, horizontal and vertical PROM pendulums standing     Consulted and Agree with Plan of Care  Patient       Patient will benefit from skilled therapeutic intervention in order to improve the following deficits and impairments:  Decreased skin integrity, Increased fascial restricitons, Pain, Decreased mobility, Decreased scar mobility, Increased muscle spasms, Postural dysfunction, Decreased activity tolerance, Decreased range of motion, Decreased strength, Hypomobility, Impaired perceived functional ability, Impaired UE functional use, Decreased safety awareness, Impaired  flexibility  Visit Diagnosis: Stiffness of right shoulder, not elsewhere classified - Plan: PT plan of care cert/re-cert  Acute pain of right shoulder - Plan: PT plan of care cert/re-cert  Muscle weakness (generalized) - Plan: PT plan of care cert/re-cert  Abnormal posture - Plan: PT plan of care cert/re-cert     Problem List Patient Active Problem List   Diagnosis Date Noted  . [place] 06/07/2014  . Smoker 06/07/2014  . GERD (gastroesophageal reflux disease) 06/07/2014  . Hemorrhoid 05/27/2014    KDeniece ReePT, DPT, CBIS  Supplemental Physical Therapist [place]  Pager 3[facility] [address]Phone: [phone]  Fax:  [phone] Name: AKayveon LennartzMRN: [id number] of Birth: [birth year]

## 2018-04-20 ENCOUNTER — Encounter: Payer: Commercial Managed Care - PPO | Admitting: Physical Therapy

## 2018-04-20 ENCOUNTER — Telehealth: Payer: Self-pay | Admitting: Adult Health

## 2018-04-20 DIAGNOSIS — Z471 Aftercare following joint replacement surgery: Secondary | ICD-10-CM

## 2018-04-20 DIAGNOSIS — M25512 Pain in left shoulder: Secondary | ICD-10-CM

## 2018-04-20 NOTE — Telephone Encounter (Signed)
Copied from [place] [phone]. Topic: Referral - Request >> Apr 20, 2018 11:16 AM Margot Ables wrote: Reason for CRM: pt is needing referral sent to physical therapy at [facility] for acute pain of left shoulder. He has gone before for his right shoulder. Please advise once sent.

## 2018-04-20 NOTE — Telephone Encounter (Signed)
Ok for referral?

## 2018-04-21 ENCOUNTER — Encounter: Payer: Commercial Managed Care - PPO | Admitting: Physical Therapy

## 2018-04-21 ENCOUNTER — Encounter: Payer: Self-pay | Admitting: Physical Therapy

## 2018-04-21 ENCOUNTER — Ambulatory Visit: Payer: Commercial Managed Care - PPO | Attending: Adult Health | Admitting: Physical Therapy

## 2018-04-21 DIAGNOSIS — M25512 Pain in left shoulder: Secondary | ICD-10-CM | POA: Diagnosis present

## 2018-04-21 DIAGNOSIS — M25511 Pain in right shoulder: Secondary | ICD-10-CM | POA: Insufficient documentation

## 2018-04-21 DIAGNOSIS — M25612 Stiffness of left shoulder, not elsewhere classified: Secondary | ICD-10-CM | POA: Diagnosis present

## 2018-04-21 DIAGNOSIS — M6281 Muscle weakness (generalized): Secondary | ICD-10-CM

## 2018-04-21 DIAGNOSIS — R293 Abnormal posture: Secondary | ICD-10-CM

## 2018-04-21 DIAGNOSIS — M25611 Stiffness of right shoulder, not elsewhere classified: Secondary | ICD-10-CM | POA: Diagnosis not present

## 2018-04-21 NOTE — Therapy (Signed)
[place], [place], [address] Phone: [phone]   Fax:  [phone]  Physical Therapy Treatment  Patient Details  Name: Joshua Stephenson MRN: [id number] Date of Birth: 06-29-1956 Referring Provider: Dorothyann Peng    Encounter Date: 04/21/2018  PT End of Session - 04/21/18 1109    Visit Number  15    Number of Visits  38    Date for PT Re-Evaluation  05/17/18    Authorization Type  [employer]R/[employer] PPO     Authorization Time Period  1/62/44 to 6/95/07; recert done 11/15/73    PT Start Time  1024    PT Stop Time  1102    PT Time Calculation (min)  38 min    Activity Tolerance  Patient tolerated treatment well;Patient limited by pain    Behavior During Therapy  [facility] for tasks assessed/performed       Past Medical History:  Diagnosis Date  . Anxiety   . Asthma    as a child  . COPD, mild ([place]) 06/07/2014  . GERD (gastroesophageal reflux disease)   . Hemorrhoids     Past Surgical History:  Procedure Laterality Date  . COLONOSCOPY  2011  . SHOULDER ARTHROSCOPY W/ ROTATOR CUFF REPAIR Right 02/08/2018    There were no vitals filed for this visit.  Subjective Assessment - 04/21/18 1026    Subjective  I called MD about left shoulder referral. 6/10 pain right shoulder. I have difficulty getting comfortable to sleep and sleep sitting up alot.     Currently in Pain?  Yes    Pain Score  6     Pain Location  Shoulder    Pain Orientation  Right    Pain Descriptors / Indicators  Sharp;Aching    Pain Radiating Towards  down to elbow and 3rd and 4th digit                        [facility] Adult PT Treatment/Exercise - 04/21/18 0001      Shoulder Exercises: Supine   External Rotation  AROM;15 reps from stomach     Other Supine Exercises  Supine flexed arm shoulder flexion x 10, then low range flexion and extension x 10 closely monitoring for pain       Shoulder Exercises: Isometric Strengthening    Other Isometric Exercises  5 sec x 10 extension, ER, IR done supine, then standing, added flexion as well       Manual Therapy   Manual Therapy  Joint mobilization    Joint Mobilization  Gentle grade1 and 2 A/P and inferior glides with cues to relax     Passive ROM  Flexion, IR , ER             PT Education - 04/21/18 1102    Education provided  Yes    Education Details  HEP    Person(s) Educated  Patient    Methods  Explanation;Handout    Comprehension  Verbalized understanding       PT Short Term Goals - 04/19/18 1437      PT SHORT TERM GOAL #1   Title  Patient to demonstrate R shoulder PROM as being full on all measured planes in order to show improved shoulder mobility     Baseline  7/30- progresing, limited by pain     Time  4    Period  Weeks    Status  Partially Met  PT SHORT TERM GOAL #2   Title  Patient to demonstrate R shoulder AROM as being full on all measured planes in order to show improved shoulder mobility and tolerance to activity     Baseline  7/30- limited by pain     Time  4    Period  Weeks    Status  On-going      PT SHORT TERM GOAL #3   Title  Patient to be able to maintain correct functional posture at least 75% of the time in order to assist in maintaining correct shoulder mechanics and reducing pain     Baseline  7/30- improved but somewhat limited     Time  4    Period  Weeks    Status  Partially Met      PT SHORT TERM GOAL #4   Title  Patient to be compliant with appropriate HEP, to be updated PRN     Baseline  7/30- compliant, wife assist     Time  1    Period  Weeks    Status  Achieved        PT Long Term Goals - 04/19/18 1438      PT LONG TERM GOAL #1   Title  Patient to demonstrate R shoulder strength as being at least 4/5 in all tested groups in order to show improving functional strength and to reduce pain     Baseline  7/30- roughly 2+/5 to 3-/5 pain limited by gross assessment     Time  8    Period  Weeks     Status  On-going      PT LONG TERM GOAL #2   Title  Patient to be able to reach overhead with R shoulder without scapular winging or upper trap compensation pattern in order to show improved shoulder mechanics     Baseline  7/30- DNT     Time  8    Period  Weeks    Status  On-going      PT LONG TERM GOAL #3   Title  Patient to report pain as being no more than 2/10 R shoulder in order to improve QOL and functional task tolerance     Baseline  7/30- 4-5/10     Time  8    Period  Weeks    Status  On-going      PT LONG TERM GOAL #4   Title  Patient to be able to perform all self care tasks with R shoulder independently with no increase in pain in order to show improved self-efficacy     Baseline  7/30- ongoing     Time  8    Period  Weeks    Status  On-going            Plan - 04/21/18 1102    Clinical Impression Statement  Pain 6/10 before treatment. Performed gentle grade 2 right shoulder mobs to decrease pain with cues to relax. Pt has difficulty relaxing for PROM. Began low range shoulder flexion and ER AROM with some increase pain. Performed gentle isometrics with manual resistance and instructed pt in isometrics at wall for HEP. Cues given to keep pain minimal. Pain 5/10 at end of treatment. He declined modalities.     PT Next Visit Plan  AROM, STM, pain control R shoulder; if he has MD referral for L shoulder, eval on L. Update R shoulder HEP (review isometrics)(  conside gentle AROM)    PT  Home Exercise Plan  Eval: grip squeezes, wrist flexion/extension, elbow flexion/extension, horizontal and vertical PROM pendulums standing        Patient will benefit from skilled therapeutic intervention in order to improve the following deficits and impairments:  Decreased skin integrity, Increased fascial restricitons, Pain, Decreased mobility, Decreased scar mobility, Increased muscle spasms, Postural dysfunction, Decreased activity tolerance, Decreased range of motion, Decreased  strength, Hypomobility, Impaired perceived functional ability, Impaired UE functional use, Decreased safety awareness, Impaired flexibility  Visit Diagnosis: Stiffness of right shoulder, not elsewhere classified  Acute pain of right shoulder  Muscle weakness (generalized)  Abnormal posture     Problem List Patient Active Problem List   Diagnosis Date Noted  . COPD, mild ([place]) 06/07/2014  . Smoker 06/07/2014  . GERD (gastroesophageal reflux disease) 06/07/2014  . Hemorrhoid 05/27/2014    Dorene Ar, PTA 04/21/2018, 11:11 AM  [place], [place], [address] Phone: [phone]   Fax:  [phone]  Name: Joshua Stephenson MRN: [id number] Date of Birth: 1956-02-22

## 2018-04-21 NOTE — Telephone Encounter (Signed)
Left a message for a return call.

## 2018-04-21 NOTE — Patient Instructions (Signed)
Strengthening: Isometric Flexion  Using wall for resistance, press right fist into ball using light pressure. Hold __5__ seconds. Repeat ___10_ times per set. Do ___2_ sets per session. Do __2__ sessions per day.   Extension (Isometric)  Place left bent elbow and back of arm against wall. Press elbow against wall. Hold __5__ seconds. Repeat __10__ times. Do ___2_ sessions per day.  Internal Rotation (Isometric)  Place palm of right fist against door frame, with elbow bent. Press fist against door frame. Hold __5__ seconds. Repeat __10__ times. Do _2___ sessions per day.  External Rotation (Isometric)  Place back of left fist against door frame, with elbow bent. Press fist against door frame. Hold _5___ seconds. Repeat _10___ times. Do _2___ sessions per day.  Copyright  [employer]. All rights reserved.

## 2018-04-21 NOTE — Telephone Encounter (Signed)
Referral placed.

## 2018-04-21 NOTE — Telephone Encounter (Signed)
Pt wife is returning misty call

## 2018-04-26 ENCOUNTER — Encounter: Payer: Self-pay | Admitting: Physical Therapy

## 2018-04-26 ENCOUNTER — Ambulatory Visit: Payer: Commercial Managed Care - PPO | Admitting: Adult Health

## 2018-04-26 ENCOUNTER — Telehealth: Payer: Self-pay | Admitting: Internal Medicine

## 2018-04-26 ENCOUNTER — Ambulatory Visit: Payer: Commercial Managed Care - PPO | Admitting: Physical Therapy

## 2018-04-26 ENCOUNTER — Encounter: Payer: Self-pay | Admitting: Adult Health

## 2018-04-26 VITALS — BP 116/74 | Temp 98.6°F | Wt 161.0 lb

## 2018-04-26 DIAGNOSIS — G8929 Other chronic pain: Secondary | ICD-10-CM

## 2018-04-26 DIAGNOSIS — M25611 Stiffness of right shoulder, not elsewhere classified: Secondary | ICD-10-CM

## 2018-04-26 DIAGNOSIS — M25512 Pain in left shoulder: Secondary | ICD-10-CM | POA: Diagnosis not present

## 2018-04-26 DIAGNOSIS — F329 Major depressive disorder, single episode, unspecified: Secondary | ICD-10-CM

## 2018-04-26 DIAGNOSIS — M25511 Pain in right shoulder: Secondary | ICD-10-CM

## 2018-04-26 DIAGNOSIS — M6281 Muscle weakness (generalized): Secondary | ICD-10-CM

## 2018-04-26 DIAGNOSIS — R293 Abnormal posture: Secondary | ICD-10-CM

## 2018-04-26 MED ORDER — OXYCODONE-ACETAMINOPHEN 10-325 MG PO TABS: 0.5000 | ORAL_TABLET | ORAL | 0 refills | Status: DC | PRN

## 2018-04-26 MED ORDER — ESCITALOPRAM OXALATE 10 MG PO TABS: 10.0000 mg | ORAL_TABLET | Freq: Every day | ORAL | 2 refills | Status: DC

## 2018-04-26 NOTE — Progress Notes (Signed)
Subjective:    Patient ID: Joshua Stephenson, male    DOB: September 25, 1955, 62 y.o.   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 He presents to the office today for post surgical follow up. He recently had his right shoulder surgically repaired in [place] and returned to have his left shoulder done. Since returning from [place] from the second surgery he has been participating in PT. Reports that this is going well and he feels as though his left shoulder is healing better than his right. He continues to have pain and loss of ROM and strength in right shoulder/arm. He reports that he has difficulty getting comfortable to sleep and is having to sleep sitting up. He was prescribed Tramadol by the surgeon but reports that this is not helping his pain. It was recommended by PT that he be seen by orthopedics here in [facility] for further evaluation.   In addition to this he reports that since having the first surgery he has been suffering from depression. He has had some suicidal thoughts and expressed this to his PT a few weeks ago. Since that incident he reports that he has been talking with his wife and counselor, he no longer feels suicidal but continues to feel depressed. This stems from his pain and not being able to do all of his ADL's.  He is interested in going on an anti depressant.    Review of Systems See HPI   Past Medical History:  Diagnosis Date  . Anxiety   . Asthma    as a child  . COPD, mild ([place]) 06/07/2014  . GERD (gastroesophageal reflux disease)   . Hemorrhoids     Social History   Socioeconomic History  . Marital status: Married    Spouse name: Not on file  . Number of children: 1   . Years of education: 73   . Highest education level: Not on file  Occupational History  . Occupation: Psychologist, occupational: [employer]  . Financial resource strain: Not on file  . Food insecurity:    Worry: Not on file    Inability: Not on file  . Transportation needs:    Medical: Not on file    Non-medical: Not on file  Tobacco Use  . Smoking status: Former Smoker    Packs/day: 1.00    Years: 20.00    Pack years: 20.00    Types: Cigarettes    Last attempt to quit: 12/2017    Years since quitting: 0.3  . Smokeless tobacco: Never Used  Substance and Sexual Activity  . Alcohol use: Not Currently  . Drug use: No  . Sexual activity: Yes    Birth control/protection: None  Lifestyle  . Physical activity:    Days per week: Not on file    Minutes per session: Not on file  . Stress: Not on file  Relationships  . Social connections:    Talks on phone: Not on file    Gets together: Not on file    Attends religious service: Not on file    Active member of club or organization: Not on file    Attends meetings of clubs or organizations: Not on file    Relationship status: Not on file  . Intimate partner violence:    Fear of current or  ex partner: Not on file    Emotionally abused: Not on file    Physically abused: Not on file    Forced sexual activity: Not on file  Other Topics Concern  . Not on file  Social History Narrative   Married   1 grown daughter (prior relationship I think) lives [place] of [address]    He likes to be outside.    Works at [place] for [employer]   Former smoker, no EtOH and no caffeine or drugs       Past Surgical History:  Procedure Laterality Date  . COLONOSCOPY  2011  . SHOULDER ARTHROSCOPY W/ ROTATOR CUFF REPAIR Right 02/08/2018    Family History  Problem Relation Age of Onset  . Diabetes Father   . Heart disease Father   . Hypertension Father   . Breast cancer Mother   . Hypertension Mother   . Cancer Sister        type unknown  . Cancer Brother        oral cancer   . Breast cancer Other        niece  . Cancer Brother         oral cancer     No Known Allergies  Current Outpatient Medications on File Prior to Visit  Medication Sig Dispense Refill  . amitriptyline (ELAVIL) 25 MG tablet Take 25 mg by mouth at bedtime.    . dicyclomine (BENTYL) 20 MG tablet Take 1 tablet (20 mg total) by mouth 4 (four) times daily -  before meals and at bedtime. As needed for cramps and diarrhea 120 tablet 0  . Esomeprazole Magnesium (NEXIUM PO) Take by mouth.    . naproxen (NAPROSYN) 500 MG tablet Take 1 tablet (500 mg total) by mouth 2 (two) times daily with a meal. 180 tablet 1  . traMADol (ULTRAM) 50 MG tablet 1-2 tabs po tid prn pain for chronic shoulder pain this is thirty day supply 180 tablet 2  . varenicline (CHANTIX) 1 MG tablet Take 1 tablet (1 mg total) by mouth 2 (two) times daily. 60 tablet 3   No current facility-administered medications on file prior to visit.     BP 116/74   Temp 98.6 F (37 C) (Oral)   Wt 161 lb (73 kg)   BMI 25.03 kg/m       Objective:   Physical Exam  Constitutional: He is oriented to person, place, and time. He appears well-developed and well-nourished. No distress.  Cardiovascular: Normal rate, regular rhythm, normal heart sounds and intact distal pulses.  Pulmonary/Chest: Effort normal and breath sounds normal.  Musculoskeletal: He exhibits tenderness. He exhibits no deformity.  Tenderness throughout bilateral shoulders. Left shoulder in sling. Has decreased grip strength in bilateral hands. Very limited ROM bilaterally.   Neurological: He is alert and oriented to person, place, and time. He displays tremor (left upper extremity ). No cranial nerve deficit or sensory deficit. He exhibits abnormal muscle tone.  Skin: Skin is warm and dry. He is not diaphoretic.  Psychiatric: He has a normal mood and affect. His behavior is normal. Judgment and thought content normal.  Nursing note and vitals reviewed.     Assessment & Plan:  1. Chronic pain of both shoulders - Will d/c tramadol  as this does not appear to be working. Will do short course of Percocet  - AMB referral to orthopedics - oxyCODONE-acetaminophen (PERCOCET) 10-325 MG tablet; Take 0.5-1 tablets by mouth every  4 (four) hours as needed for pain.  Dispense: 20 tablet; Refill: 0 - Continue with PT  - Follow up as needed   2. Reactive depression - escitalopram (LEXAPRO) 10 MG tablet; Take 1 tablet (10 mg total) by mouth daily.  Dispense: 30 tablet; Refill: 2 - Discussed side effects  - Advised follow up in one month or sooner if suicidal ideation develops   Dorothyann Peng, NP

## 2018-04-26 NOTE — Telephone Encounter (Signed)
Left message for patient to call back Dr. Carlean Purl sent a mychart message about negative results.  Patient needs to have a colonoscopy for diarrhea.

## 2018-04-26 NOTE — Therapy (Signed)
[place], [place], [address] Phone: [phone]   Fax:  [phone]  Physical Therapy Treatment  Patient Details  Name: Joshua Stephenson MRN: [id number] Date of Birth: [birth year] Referring Provider: Dorothyann Peng    Encounter Date: 04/26/2018  PT End of Session - 04/26/18 1424    Visit Number  16    Number of Visits  38    Date for PT Re-Evaluation  05/17/18    Authorization Type  UMR/UHC PPO     Authorization Time Period  1/91/66 to 0/60/04; recert done 5/99/77    PT Start Time  1111 Patient 11 minutes late     PT Stop Time  1145    PT Time Calculation (min)  34 min    Activity Tolerance  Patient tolerated treatment well;Patient limited by pain    Behavior During Therapy  [facility] for tasks assessed/performed       Past Medical History:  Diagnosis Date  . Anxiety   . Asthma    as a child  . COPD, mild ([place]) 06/07/2014  . GERD (gastroesophageal reflux disease)   . Hemorrhoids     Past Surgical History:  Procedure Laterality Date  . COLONOSCOPY  2011  . SHOULDER ARTHROSCOPY W/ ROTATOR CUFF REPAIR Right 02/08/2018    There were no vitals filed for this visit.  Subjective Assessment - 04/26/18 1131    Subjective  Patient continues to have pain in bilateral shoulders. He reports the right has been moving better but he is hearing a loud pop in the shoulder. Both shoulder pain level is about 5/5 today. Hehas been to his NP who adivsed him to take his armout of his sling because of his swelling.                        [facility] Adult PT Treatment/Exercise - 04/26/18 0001      Shoulder Exercises: Supine   Other Supine Exercises  right supine flexion hold at 90 x10 sec bilateral; ER rythmic stabs 2x30 sec hold bilateral       Shoulder Exercises: Seated   Other Seated Exercises  scap retraction 2x10 in pain free range       Modalities   Modalities  Cryotherapy      Cryotherapy   Number Minutes Cryotherapy  10 Minutes    Cryotherapy Location  Shoulder    Type of Cryotherapy  Ice pack      Manual Therapy   Joint Mobilization  difficulty perfroming mobilization on the left 2nd to extreme difficulty tolerating light touch.     Passive ROM  Flexion, IR , ER             PT Education - 04/26/18 1424    Education provided  Yes    Education Details  reviewed HEP     Person(s) Educated  Patient    Methods  Explanation;Demonstration;Verbal cues;Tactile cues    Comprehension  Verbalized understanding;Returned demonstration;Verbal cues required;Tactile cues required       PT Short Term Goals - 04/19/18 1437      PT SHORT TERM GOAL #1   Title  Patient to demonstrate R shoulder PROM as being full on all measured planes in order to show improved shoulder mobility     Baseline  7/30- progresing, limited by pain     Time  4    Period  Weeks    Status  Partially Met  PT SHORT TERM GOAL #2   Title  Patient to demonstrate R shoulder AROM as being full on all measured planes in order to show improved shoulder mobility and tolerance to activity     Baseline  7/30- limited by pain     Time  4    Period  Weeks    Status  On-going      PT SHORT TERM GOAL #3   Title  Patient to be able to maintain correct functional posture at least 75% of the time in order to assist in maintaining correct shoulder mechanics and reducing pain     Baseline  7/30- improved but somewhat limited     Time  4    Period  Weeks    Status  Partially Met      PT SHORT TERM GOAL #4   Title  Patient to be compliant with appropriate HEP, to be updated PRN     Baseline  7/30- compliant, wife assist     Time  1    Period  Weeks    Status  Achieved        PT Long Term Goals - 04/19/18 1438      PT LONG TERM GOAL #1   Title  Patient to demonstrate R shoulder strength as being at least 4/5 in all tested groups in order to show improving functional strength and to reduce pain      Baseline  7/30- roughly 2+/5 to 3-/5 pain limited by gross assessment     Time  8    Period  Weeks    Status  On-going      PT LONG TERM GOAL #2   Title  Patient to be able to reach overhead with R shoulder without scapular winging or upper trap compensation pattern in order to show improved shoulder mechanics     Baseline  7/30- DNT     Time  8    Period  Weeks    Status  On-going      PT LONG TERM GOAL #3   Title  Patient to report pain as being no more than 2/10 R shoulder in order to improve QOL and functional task tolerance     Baseline  7/30- 4-5/10     Time  8    Period  Weeks    Status  On-going      PT LONG TERM GOAL #4   Title  Patient to be able to perform all self care tasks with R shoulder independently with no increase in pain in order to show improved self-efficacy     Baseline  7/30- ongoing     Time  8    Period  Weeks    Status  On-going            Plan - 04/26/18 1426    Clinical Impression Statement  Therapy perfromed light flexion stretching on the right side. Patient reported significant pain in his [facility] joint. Therapy palpated the [facility] joint lgithly and felt a large pop on the right. This happened 2x. On the left he has signifcant tenderness to light palpation on the left. Therapy was unable to perfrom even light mobilizations. He reports the MD did an x-ray on the right when he was in [place] and reported the shoulder was fine on the right. He is now done in [place]. Therapy asked him if he would be followed by an orthopedic MD here. He reported he did not know.  He was advised not to do AAROM given to him for the right arm yet because they require the left arm. Therapy attempted right UE light stability exercises with minimal movement but the patient reported tightnening of the shoulder     History and Personal Factors relevant to plan of care:  R shoulder RCR, SAD, distal clavicle, labral debridement;     Clinical Presentation  Evolving    Clinical  Decision Making  Low    Rehab Potential  Good    PT Frequency  2x / week    PT Duration  8 weeks    PT Treatment/Interventions  ADLs/Self Care Home Management;Biofeedback;Cryotherapy;Electrical Stimulation;Iontophoresis 68m/ml Dexamethasone;Moist Heat;Traction;Ultrasound;Functional mobility training;Therapeutic activities;Therapeutic exercise;Balance training;Neuromuscular re-education;Patient/family education;Manual techniques;Scar mobilization;Passive range of motion;Dry needling;Energy conservation;Taping    PT Next Visit Plan  AROM, STM, pain control R shoulder; if he has MD referral for L shoulder, eval on L. Update R shoulder HEP (review isometrics)(  conside gentle AROM)    PT Home Exercise Plan  Eval: grip squeezes, wrist flexion/extension, elbow flexion/extension, horizontal and vertical PROM pendulums standing     Consulted and Agree with Plan of Care  Patient       Patient will benefit from skilled therapeutic intervention in order to improve the following deficits and impairments:  Decreased skin integrity, Increased fascial restricitons, Pain, Decreased mobility, Decreased scar mobility, Increased muscle spasms, Postural dysfunction, Decreased activity tolerance, Decreased range of motion, Decreased strength, Hypomobility, Impaired perceived functional ability, Impaired UE functional use, Decreased safety awareness, Impaired flexibility  Visit Diagnosis: Stiffness of right shoulder, not elsewhere classified  Acute pain of right shoulder  Muscle weakness (generalized)  Abnormal posture     Problem List Patient Active Problem List   Diagnosis Date Noted  . COPD, mild ([place] 06/07/2014  . Smoker 06/07/2014  . GERD (gastroesophageal reflux disease) 06/07/2014  . Hemorrhoid 05/27/2014    DCarney LivingPT DPT  04/26/2018, 2:39 PM  [facility] [address]Phone: [phone]  Fax:   [phone] Name: Joshua DraganMRN: [id number] of Birth: [birth year]

## 2018-04-27 NOTE — Telephone Encounter (Signed)
Patient notified of results and recommendations He agrees to proceed with colonoscopy He is scheduled for colon and pre-visit for 9/16 and 10/3

## 2018-04-28 ENCOUNTER — Encounter: Payer: Self-pay | Admitting: Physical Therapy

## 2018-04-28 ENCOUNTER — Ambulatory Visit: Payer: Commercial Managed Care - PPO | Admitting: Physical Therapy

## 2018-04-28 DIAGNOSIS — M25511 Pain in right shoulder: Secondary | ICD-10-CM

## 2018-04-28 DIAGNOSIS — R293 Abnormal posture: Secondary | ICD-10-CM

## 2018-04-28 DIAGNOSIS — M25611 Stiffness of right shoulder, not elsewhere classified: Secondary | ICD-10-CM | POA: Diagnosis not present

## 2018-04-28 DIAGNOSIS — M6281 Muscle weakness (generalized): Secondary | ICD-10-CM

## 2018-04-28 NOTE — Therapy (Signed)
Joshua Stephenson, [place], [address] Phone: [phone]   Fax:  [phone]  Physical Therapy Treatment  Patient Details  Name: Joshua Stephenson MRN: [id number] Date of Birth: Jun 24, 1956 Referring Provider: Dorothyann Peng    Encounter Date: 04/28/2018  PT End of Session - 04/28/18 1456    Visit Number  17    Number of Visits  38    Date for PT Re-Evaluation  05/17/18    Authorization Type  UMR/UHC PPO     Authorization Time Period  03/23/49 to 0/93/81; recert done 05/19/92    PT Start Time  1101    PT Stop Time  1142    PT Time Calculation (min)  41 min    Activity Tolerance  Patient tolerated treatment well;Patient limited by pain    Behavior During Therapy  [facility] for tasks assessed/performed       Past Medical History:  Diagnosis Date  . Anxiety   . Asthma    as a child  . COPD, mild ([place]) 06/07/2014  . GERD (gastroesophageal reflux disease)   . Hemorrhoids     Past Surgical History:  Procedure Laterality Date  . COLONOSCOPY  2011  . SHOULDER ARTHROSCOPY W/ ROTATOR CUFF REPAIR Right 02/08/2018    There were no vitals filed for this visit.  Subjective Assessment - 04/28/18 1424    Subjective  Patient reports he has beensore but he is doing better now that he has some pain medication. He has an appointment to have his neck looked at by an orthopedic.     How long can you sit comfortably?  6/26- riding in a car, about an hour     How long can you stand comfortably?  6/26- no limits in sling     How long can you walk comfortably?  6/26- no limits in sling     Patient Stated Goals  reduce pain, be able to go back to work     Currently in Pain?  Yes    Pain Score  5     Pain Orientation  Right;Left    Pain Descriptors / Indicators  Aching    Pain Type  Chronic pain    Pain Onset  More than a month ago    Pain Frequency  Constant    Aggravating Factors   laying down     Pain Relieving Factors   heat, warm water     Effect of Pain on Daily Activities  severe                        [facility] Adult PT Treatment/Exercise - 04/28/18 0001      Shoulder Exercises: Supine   Other Supine Exercises  right supine flexion hold at 90 x10 sec bilateral 2x ; ER rythmic stabs 2x30 sec hold bilateral       Shoulder Exercises: Seated   Other Seated Exercises  scap retraction 2x10 in pain free range       Shoulder Exercises: Isometric Strengthening   Flexion Limitations  x10 5 sec hold     External Rotation Limitations  x10 5 sec hold     Internal Rotation Limitations  x10 5 sec hold       Modalities   Modalities  --   declined             PT Education - 04/28/18 1455    Education provided  Yes  Education Details  updated HEP     Person(s) Educated  Patient    Methods  Explanation;Demonstration;Tactile cues;Verbal cues    Comprehension  Verbalized understanding;Verbal cues required;Returned demonstration;Tactile cues required       PT Short Term Goals - 04/28/18 1554      PT SHORT TERM GOAL #1   Title  Patient to demonstrate R shoulder PROM as being full on all measured planes in order to show improved shoulder mobility     Baseline  7/30- progresing, limited by pain     Time  4    Period  Weeks    Status  On-going      PT SHORT TERM GOAL #2   Title  Patient to demonstrate R shoulder AROM as being full on all measured planes in order to show improved shoulder mobility and tolerance to activity     Baseline  7/30- limited by pain     Time  4    Period  Weeks    Status  On-going      PT SHORT TERM GOAL #3   Title  Patient to be able to maintain correct functional posture at least 75% of the time in order to assist in maintaining correct shoulder mechanics and reducing pain     Baseline  7/30- improved but somewhat limited     Time  4    Period  Weeks    Status  On-going      PT SHORT TERM GOAL #4   Title  Patient to be compliant with appropriate HEP,  to be updated PRN     Baseline  7/30- compliant, wife assist     Time  1    Period  Weeks    Status  On-going        PT Long Term Goals - 04/19/18 1438      PT LONG TERM GOAL #1   Title  Patient to demonstrate R shoulder strength as being at least 4/5 in all tested groups in order to show improving functional strength and to reduce pain     Baseline  7/30- roughly 2+/5 to 3-/5 pain limited by gross assessment     Time  8    Period  Weeks    Status  On-going      PT LONG TERM GOAL #2   Title  Patient to be able to reach overhead with R shoulder without scapular winging or upper trap compensation pattern in order to show improved shoulder mechanics     Baseline  7/30- DNT     Time  8    Period  Weeks    Status  On-going      PT LONG TERM GOAL #3   Title  Patient to report pain as being no more than 2/10 R shoulder in order to improve QOL and functional task tolerance     Baseline  7/30- 4-5/10     Time  8    Period  Weeks    Status  On-going      PT LONG TERM GOAL #4   Title  Patient to be able to perform all self care tasks with R shoulder independently with no increase in pain in order to show improved self-efficacy     Baseline  7/30- ongoing     Time  8    Period  Weeks    Status  On-going            Plan - 04/28/18  1238    Clinical Impression Statement  The patient tolerated mobilization and stretching. he continues to have a pop with palpation of his AC joint. He had less sensativity to light touch today. He still has significant pain with PROM of his right UE. Therapy reviewed is[facility]rics for his right shoulder.     Clinical Presentation  Evolving    Clinical Decision Making  Low    Rehab Potential  Good    PT Frequency  2x / week    PT Duration  8 weeks    PT Treatment/Interventions  ADLs/Self Care Home Management;Biofeedback;Cryotherapy;Electrical Stimulation;Iontophoresis 4mg /ml Dexamethasone;Moist Heat;Traction;Ultrasound;Functional mobility  training;Therapeutic activities;Therapeutic exercise;Balance training;Neuromuscular re-education;Patient/family education;Manual techniques;Scar mobilization;Passive range of motion;Dry needling;Energy conservation;Taping    PT Next Visit Plan  AROM, STM, pain control R shoulder; if he has MD referral for L shoulder, eval on L. Update R shoulder HEP (review isometrics)(  conside gentle AROM)    PT Home Exercise Plan  Eval: grip squeezes, wrist flexion/extension, elbow flexion/extension, horizontal and vertical PROM pendulums standing        Patient will benefit from skilled therapeutic intervention in order to improve the following deficits and impairments:  Decreased skin integrity, Increased fascial restricitons, Pain, Decreased mobility, Decreased scar mobility, Increased muscle spasms, Postural dysfunction, Decreased activity tolerance, Decreased range of motion, Decreased strength, Hypomobility, Impaired perceived functional ability, Impaired UE functional use, Decreased safety awareness, Impaired flexibility  Visit Diagnosis: Stiffness of right shoulder, not elsewhere classified  Acute pain of right shoulder  Muscle weakness (generalized)  Abnormal posture     Problem List Patient Active Problem List   Diagnosis Date Noted  . COPD, mild ([place]) 06/07/2014  . Smoker 06/07/2014  . GERD (gastroesophageal reflux disease) 06/07/2014  . Hemorrhoid 05/27/2014    Carney Living PT DPT  04/28/2018, 3:56 PM  [facility] [address] [place], [place], [address] Phone: [phone]   Fax:  [phone]  Name: Nilo Fallin MRN: [id number] Date of Birth: 05-19-1956

## 2018-05-03 ENCOUNTER — Ambulatory Visit: Payer: Commercial Managed Care - PPO | Admitting: Physical Therapy

## 2018-05-03 DIAGNOSIS — M6281 Muscle weakness (generalized): Secondary | ICD-10-CM

## 2018-05-03 DIAGNOSIS — M25611 Stiffness of right shoulder, not elsewhere classified: Secondary | ICD-10-CM

## 2018-05-03 DIAGNOSIS — R293 Abnormal posture: Secondary | ICD-10-CM

## 2018-05-03 DIAGNOSIS — M25511 Pain in right shoulder: Secondary | ICD-10-CM

## 2018-05-04 ENCOUNTER — Encounter: Payer: Self-pay | Admitting: Physical Therapy

## 2018-05-04 NOTE — Therapy (Signed)
[place], [place], [address] Phone: [phone]   Fax:  [phone]  Physical Therapy Treatment  Patient Details  Name: Joshua Stephenson MRN: [id number] Date of Birth: [birth year] Referring Provider: Dorothyann Peng    Encounter Date: 05/03/2018  PT End of Session - 05/03/18 1421    Visit Number  18    Number of Visits  38    Date for PT Re-Evaluation  05/17/18    Authorization Type  [employer] PPO     Authorization Time Period  1/95/09 to 12/14/69; recert done 2/45/80    PT Start Time  1417    PT Stop Time  1500    PT Time Calculation (min)  43 min    Activity Tolerance  Patient tolerated treatment well;Patient limited by pain    Behavior During Therapy  [facility] for tasks assessed/performed       Past Medical History:  Diagnosis Date  . Anxiety   . Asthma    as a child  . 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 GERD (gastroesophageal reflux disease)   . Hemorrhoids     Past Surgical History:  Procedure Laterality Date  . COLONOSCOPY  2011  . SHOULDER ARTHROSCOPY W/ ROTATOR CUFF REPAIR Right 02/08/2018    There were no vitals filed for this visit.  Subjective Assessment - 05/03/18 1422    Subjective  Patient reports the pain is a little better. His pain level is about a 4/10. He feels like his shoulder n the right is weak. It shakes a lot.     How long can you sit comfortably?  6/26- riding in a car, about an hour     How long can you stand comfortably?  6/26- no limits in sling     How long can you walk comfortably?  6/26- no limits in sling     Patient Stated Goals  reduce pain, be able to go back to work     Currently in Pain?  Yes    Pain Score  4     Pain Location  Shoulder    Pain Orientation  Right;Left    Pain Descriptors / Indicators  Aching    Pain Type  Chronic pain    Pain Onset  More than a month ago    Pain Frequency  Constant    Aggravating Factors   laying     Pain Relieving  Factors  heat, warm water     Effect of Pain on Daily Activities  severe                        [facility] Adult PT Treatment/Exercise - 05/04/18 0001      Shoulder Exercises: Supine   Other Supine Exercises  right supine flexion hold at 90 x10 sec bilateral 2x ; ER rythmic stabs 2x30 sec hold bilateral ; right active ER 2x10       Shoulder Exercises: Seated   Other Seated Exercises  scap retraction 2x10 in pain free range       Shoulder Exercises: Standing   Other Standing Exercises  right only scap retraction yellow 2x10. shoulderextension 2x10 yellow       Shoulder Exercises: Isometric Strengthening   External Rotation Limitations  x10 5 sec hold    right    Internal Rotation Limitations  x10 5 sec hold    Right      Manual Therapy   Joint Mobilization  attempotedlight grade I and II mobilizations. Able to perfrom after verbal cuing.     Soft tissue mobilization  R / L anterior shoulder/biceps complex/scar massage     Passive ROM  Flexion, IR , ER             PT Education - 05/03/18 1422    Education provided  Yes    Education Details  reviewed exercises for his right upper extremity.     Person(s) Educated  Patient    Methods  Explanation;Demonstration;Tactile cues;Verbal cues    Comprehension  Verbalized understanding;Returned demonstration;Verbal cues required;Tactile cues required;Need further instruction       PT Short Term Goals - 04/28/18 1554      PT SHORT TERM GOAL #1   Title  Patient to demonstrate R shoulder PROM as being full on all measured planes in order to show improved shoulder mobility     Baseline  7/30- progresing, limited by pain     Time  4    Period  Weeks    Status  On-going      PT SHORT TERM GOAL #2   Title  Patient to demonstrate R shoulder AROM as being full on all measured planes in order to show improved shoulder mobility and tolerance to activity     Baseline  7/30- limited by pain     Time  4    Period  Weeks     Status  On-going      PT SHORT TERM GOAL #3   Title  Patient to be able to maintain correct functional posture at least 75% of the time in order to assist in maintaining correct shoulder mechanics and reducing pain     Baseline  7/30- improved but somewhat limited     Time  4    Period  Weeks    Status  On-going      PT SHORT TERM GOAL #4   Title  Patient to be compliant with appropriate HEP, to be updated PRN     Baseline  7/30- compliant, wife assist     Time  1    Period  Weeks    Status  On-going        PT Long Term Goals - 04/19/18 1438      PT LONG TERM GOAL #1   Title  Patient to demonstrate R shoulder strength as being at least 4/5 in all tested groups in order to show improving functional strength and to reduce pain     Baseline  7/30- roughly 2+/5 to 3-/5 pain limited by gross assessment     Time  8    Period  Weeks    Status  On-going      PT LONG TERM GOAL #2   Title  Patient to be able to reach overhead with R shoulder without scapular winging or upper trap compensation pattern in order to show improved shoulder mechanics     Baseline  7/30- DNT     Time  8    Period  Weeks    Status  On-going      PT LONG TERM GOAL #3   Title  Patient to report pain as being no more than 2/10 R shoulder in order to improve QOL and functional task tolerance     Baseline  7/30- 4-5/10     Time  8    Period  Weeks    Status  On-going      PT LONG TERM  GOAL #4   Title  Patient to be able to perform all self care tasks with R shoulder independently with no increase in pain in order to show improved self-efficacy     Baseline  7/30- ongoing     Time  8    Period  Weeks    Status  On-going            Plan - 05/04/18 0240    Clinical Impression Statement  The patient made some improvement today. he was able to tolerate exercises with his right shoulder. He still has a large pop with palpation of his AC joint. He will be at 5 weeks with his left shoulder next week.  Patient required cuing to relax. Therapy attempted to perfrom light mobilization on his shoulder but the patient tenses up before therapy can even put pressure into his shoulder. Therapy gsave him right arm scapualr strengthening and continued with isometrics on the right side for RTC exercises.     Clinical Presentation  Evolving    Clinical Presentation due to:  post-op state     Clinical Decision Making  Low    Rehab Potential  Good    PT Frequency  2x / week    PT Duration  8 weeks    PT Treatment/Interventions  ADLs/Self Care Home Management;Biofeedback;Cryotherapy;Electrical Stimulation;Iontophoresis 4mg /ml Dexamethasone;Moist Heat;Traction;Ultrasound;Functional mobility training;Therapeutic activities;Therapeutic exercise;Balance training;Neuromuscular re-education;Patient/family education;Manual techniques;Scar mobilization;Passive range of motion;Dry needling;Energy conservation;Taping    PT Next Visit Plan  AROM, STM, pain control R shoulder; if he has MD referral for L shoulder, eval on L. Update R shoulder HEP (review isometrics)(  conside gentle AROM)       Patient will benefit from skilled therapeutic intervention in order to improve the following deficits and impairments:  Decreased skin integrity, Increased fascial restricitons, Pain, Decreased mobility, Decreased scar mobility, Increased muscle spasms, Postural dysfunction, Decreased activity tolerance, Decreased range of motion, Decreased strength, Hypomobility, Impaired perceived functional ability, Impaired UE functional use, Decreased safety awareness, Impaired flexibility  Visit Diagnosis: Stiffness of right shoulder, not elsewhere classified  Acute pain of right shoulder  Muscle weakness (generalized)  Abnormal posture     Problem List Patient Active Problem List   Diagnosis Date Noted  . 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 Smoker 06/07/2014  . GERD (gastroesophageal reflux disease) 06/07/2014  . Hemorrhoid  05/27/2014    Rachael Darby DPT  05/04/2018, 10:12 AM  [facility] [address] [place], [place], [address] Phone: [phone]   Fax:  [phone]  Name: Joshua Stephenson MRN: [id number] Date of Birth: 12-21-1955

## 2018-05-05 ENCOUNTER — Ambulatory Visit: Payer: Commercial Managed Care - PPO | Admitting: Physical Therapy

## 2018-05-05 ENCOUNTER — Telehealth: Payer: Self-pay | Admitting: Adult Health

## 2018-05-05 ENCOUNTER — Encounter: Payer: Self-pay | Admitting: Physical Therapy

## 2018-05-05 DIAGNOSIS — M6281 Muscle weakness (generalized): Secondary | ICD-10-CM

## 2018-05-05 DIAGNOSIS — M25611 Stiffness of right shoulder, not elsewhere classified: Secondary | ICD-10-CM | POA: Diagnosis not present

## 2018-05-05 DIAGNOSIS — R293 Abnormal posture: Secondary | ICD-10-CM

## 2018-05-05 DIAGNOSIS — M25511 Pain in right shoulder: Secondary | ICD-10-CM

## 2018-05-05 NOTE — Therapy (Signed)
[place], [place], [address] Phone: [phone]   Fax:  [phone]  Physical Therapy Treatment  Patient Details  Name: Joshua Stephenson MRN: [id number] Date of Birth: 1956/07/27 Referring Provider: Dorothyann Peng    Encounter Date: 05/05/2018  PT End of Session - 05/05/18 1106    Visit Number  19    Number of Visits  38    Date for PT Re-Evaluation  05/17/18    Authorization Type  [employer] PPO     Authorization Time Period  1/54/00 to 8/67/61; recert done 9/50/93    PT Start Time  1100    PT Stop Time  1140    PT Time Calculation (min)  40 min       Past Medical History:  Diagnosis Date  . Anxiety   . Asthma    as a child  . 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 GERD (gastroesophageal reflux disease)   . Hemorrhoids     Past Surgical History:  Procedure Laterality Date  . COLONOSCOPY  2011  . SHOULDER ARTHROSCOPY W/ ROTATOR CUFF REPAIR Right 02/08/2018    There were no vitals filed for this visit.  Subjective Assessment - 05/05/18 1103    Subjective  I did  not take any pain meds today. I drive myself.     Currently in Pain?  Yes    Pain Score  5     Pain Location  Shoulder                       [facility] Adult PT Treatment/Exercise - 05/05/18 0001      Shoulder Exercises: Supine   Other Supine Exercises  ER and IR yellow band x 10 each - held by PTA     Other Supine Exercises  right supine flexion hold at 90 x10 sec bilateral 2x ; ER rythmic stabs 2x30 sec hold bilateral ; right active ER 2x10 , flexion AROM full available ROM x 10 , protraction AROM x 10       Shoulder Exercises: Isometric Strengthening   External Rotation Limitations  x10 5 sec hold    right    Internal Rotation Limitations  x10 5 sec hold    Right      Manual Therapy   Joint Mobilization  right grade I A/P and inferior mobs- c/o pain    Soft tissue mobilization  pec/anterior shoulder    Passive ROM   Flex, Abdct, IR ER                PT Short Term Goals - 04/28/18 1554      PT SHORT TERM GOAL #1   Title  Patient to demonstrate R shoulder PROM as being full on all measured planes in order to show improved shoulder mobility     Baseline  7/30- progresing, limited by pain     Time  4    Period  Weeks    Status  On-going      PT SHORT TERM GOAL #2   Title  Patient to demonstrate R shoulder AROM as being full on all measured planes in order to show improved shoulder mobility and tolerance to activity     Baseline  7/30- limited by pain     Time  4    Period  Weeks    Status  On-going      PT SHORT TERM GOAL #3   Title  Patient  to be able to maintain correct functional posture at least 75% of the time in order to assist in maintaining correct shoulder mechanics and reducing pain     Baseline  7/30- improved but somewhat limited     Time  4    Period  Weeks    Status  On-going      PT SHORT TERM GOAL #4   Title  Patient to be compliant with appropriate HEP, to be updated PRN     Baseline  7/30- compliant, wife assist     Time  1    Period  Weeks    Status  On-going        PT Long Term Goals - 04/19/18 1438      PT LONG TERM GOAL #1   Title  Patient to demonstrate R shoulder strength as being at least 4/5 in all tested groups in order to show improving functional strength and to reduce pain     Baseline  7/30- roughly 2+/5 to 3-/5 pain limited by gross assessment     Time  8    Period  Weeks    Status  On-going      PT LONG TERM GOAL #2   Title  Patient to be able to reach overhead with R shoulder without scapular winging or upper trap compensation pattern in order to show improved shoulder mechanics     Baseline  7/30- DNT     Time  8    Period  Weeks    Status  On-going      PT LONG TERM GOAL #3   Title  Patient to report pain as being no more than 2/10 R shoulder in order to improve QOL and functional task tolerance     Baseline  7/30- 4-5/10     Time   8    Period  Weeks    Status  On-going      PT LONG TERM GOAL #4   Title  Patient to be able to perform all self care tasks with R shoulder independently with no increase in pain in order to show improved self-efficacy     Baseline  7/30- ongoing     Time  8    Period  Weeks    Status  On-going            Plan - 05/05/18 1218    Clinical Impression Statement  Pt still limted by pain and guarding. He has difficulty relaxing. Advanced to IR and ER strengthening with yellow band. Mild increased pain with ER. Reviewed yellow band Rt scap retract and Rt shoulder extension to neutral. He declined ice post session.     PT Next Visit Plan  AROM, STM, pain control R shoulder; if he has MD referral for L shoulder, eval on L. Update R shoulder HEP (review isometrics)(  conside gentle AROM)    PT Home Exercise Plan  Eval: grip squeezes, wrist flexion/extension, elbow flexion/extension, horizontal and vertical PROM pendulums standing , yellow band Rt shoulder scap retract and extension    Consulted and Agree with Plan of Care  Patient       Patient will benefit from skilled therapeutic intervention in order to improve the following deficits and impairments:  Decreased skin integrity, Increased fascial restricitons, Pain, Decreased mobility, Decreased scar mobility, Increased muscle spasms, Postural dysfunction, Decreased activity tolerance, Decreased range of motion, Decreased strength, Hypomobility, Impaired perceived functional ability, Impaired UE functional use, Decreased safety awareness, Impaired flexibility  Visit Diagnosis: Stiffness of right shoulder, not elsewhere classified  Acute pain of right shoulder  Muscle weakness (generalized)  Abnormal posture     Problem List Patient Active Problem List   Diagnosis Date Noted  . 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 Smoker 06/07/2014  . GERD (gastroesophageal reflux disease) 06/07/2014  . Hemorrhoid 05/27/2014    Dorene Ar, PTA 05/05/2018, 12:52 PM  [facility] [address] [place], [place], [address] Phone: [phone]   Fax:  [phone]  Name: Joshua Stephenson MRN: [id number] Date of Birth: December 21, 1955

## 2018-05-05 NOTE — Telephone Encounter (Signed)
Spoke to [place] and notified him that I updated the referral.  Nothing else further needed.

## 2018-05-05 NOTE — Telephone Encounter (Signed)
Copied from Why [phone]. Topic: Quick Communication - See Telephone Encounter >> May 05, 2018  1:14 PM Gardiner Ramus wrote: CRM for notification. See Telephone encounter for: 05/05/18. Shanon Brow from [facility] called and stated wrong diagnoses was sent in. Diagnoses needs to state left shoulder pain. Please advise [phone]

## 2018-05-06 ENCOUNTER — Ambulatory Visit (INDEPENDENT_AMBULATORY_CARE_PROVIDER_SITE_OTHER): Payer: Commercial Managed Care - PPO | Admitting: Orthopaedic Surgery

## 2018-05-06 ENCOUNTER — Ambulatory Visit (INDEPENDENT_AMBULATORY_CARE_PROVIDER_SITE_OTHER): Payer: Self-pay

## 2018-05-06 DIAGNOSIS — M542 Cervicalgia: Secondary | ICD-10-CM | POA: Diagnosis not present

## 2018-05-06 MED ORDER — PREDNISONE 10 MG (21) PO TBPK: ORAL_TABLET | ORAL | 0 refills | Status: DC

## 2018-05-06 NOTE — Progress Notes (Signed)
Office Visit Note   Patient: Joshua Stephenson           Date of Birth: 07-May-1956           MRN: [id number] Visit Date: 05/06/2018              Requested by: Dorothyann Peng, NP [place], [place] [address] PCP: Dorothyann Peng, NP   Assessment & Plan: Visit Diagnoses:  1. Neck pain     Plan: Impression is cervical radiculopathy.  He has had this issue for more than 6 weeks now without any relief.  Recommend MRI of the cervical spine to rule out structural abnormalities.  In the meantime I am going to put him on a prednisone taper to hopefully help with the symptoms.  Follow-up after the MRI.  Follow-Up Instructions: Return in about 2 weeks (around 05/20/2018).   Orders:  Orders Placed This Encounter  Procedures  . XR Cervical Spine 2 or 3 views   Meds ordered this encounter  Medications  . predniSONE (STERAPRED UNI-PAK 21 TAB) 10 MG (21) TBPK tablet    Sig: Take as directed    Dispense:  21 tablet    Refill:  0      Procedures: No procedures performed   Clinical Data: No additional findings.   Subjective: Chief Complaint  Patient presents with  . Neck - Pain    Joshua Stephenson is a 62 year old gentleman who comes in with chronic neck pain and mainly right sided radiculopathy for 6 weeks.  He previously had 2 shoulder surgeries in [place] which failed to provide him any relief.  He endorses constant pain and numbness and tingling in his right hand.  He denies any profound weakness or bowel or bladder incontinence.   Review of Systems  Constitutional: Negative.   All other systems reviewed and are negative.    Objective: Vital Signs: There were no vitals taken for this visit.  Physical Exam  Constitutional: He is oriented to person, place, and time. He appears well-developed and well-nourished.  HENT:  Head: Normocephalic and atraumatic.  Eyes: Pupils are equal, round, and reactive to light.  Neck: Neck supple.  Pulmonary/Chest:  Effort normal.  Abdominal: Soft.  Musculoskeletal: Normal range of motion.  Neurological: He is alert and oriented to person, place, and time.  Skin: Skin is warm.  Psychiatric: He has a normal mood and affect. His behavior is normal. Judgment and thought content normal.  Nursing note and vitals reviewed.   Ortho Exam Right upper extremity exam shows no focal motor or sensory deficits.  Normal reflexes.   Cervical spine is tender to palpation. Specialty Comments:  No specialty comments available.  Imaging: No results found.   PMFS History: Patient Active Problem List   Diagnosis Date Noted  . 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 Smoker 06/07/2014  . GERD (gastroesophageal reflux disease) 06/07/2014  . Hemorrhoid 05/27/2014   Past Medical History:  Diagnosis Date  . Anxiety   . Asthma    as a child  . 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 GERD (gastroesophageal reflux disease)   . Hemorrhoids     Family History  Problem Relation Age of Onset  . Diabetes Father   . Heart disease Father   . Hypertension Father   . Breast cancer Mother   . Hypertension Mother   . Cancer Sister        type unknown  . Cancer Brother  oral cancer   . Breast cancer Other        niece  . Cancer Brother        oral cancer     Past Surgical History:  Procedure Laterality Date  . COLONOSCOPY  2011  . SHOULDER ARTHROSCOPY W/ ROTATOR CUFF REPAIR Right 02/08/2018   Social History   Occupational History  . Occupation: Psychologist, occupational: [employer]  Tobacco Use  . Smoking status: Former Smoker    Packs/day: 1.00    Years: 20.00    Pack years: 20.00    Types: Cigarettes    Last attempt to quit: 12/2017    Years since quitting: 0.3  . Smokeless tobacco: Never Used  Substance and Sexual Activity  . Alcohol use: Not Currently  . Drug use: No  . Sexual activity: Yes    Birth control/protection: None

## 2018-05-06 NOTE — Addendum Note (Signed)
Addended by: Precious Bard on: 05/06/2018 11:24 AM   Modules accepted: Orders

## 2018-05-10 ENCOUNTER — Ambulatory Visit: Payer: Commercial Managed Care - PPO | Admitting: Physical Therapy

## 2018-05-12 ENCOUNTER — Ambulatory Visit: Payer: Commercial Managed Care - PPO | Admitting: Physical Therapy

## 2018-05-17 ENCOUNTER — Ambulatory Visit: Payer: Commercial Managed Care - PPO | Admitting: Physical Therapy

## 2018-05-17 DIAGNOSIS — M6281 Muscle weakness (generalized): Secondary | ICD-10-CM

## 2018-05-17 DIAGNOSIS — M25512 Pain in left shoulder: Secondary | ICD-10-CM

## 2018-05-17 DIAGNOSIS — M25511 Pain in right shoulder: Secondary | ICD-10-CM

## 2018-05-17 DIAGNOSIS — M25611 Stiffness of right shoulder, not elsewhere classified: Secondary | ICD-10-CM

## 2018-05-17 DIAGNOSIS — R293 Abnormal posture: Secondary | ICD-10-CM

## 2018-05-17 DIAGNOSIS — M25612 Stiffness of left shoulder, not elsewhere classified: Secondary | ICD-10-CM

## 2018-05-17 NOTE — Therapy (Signed)
[place], [place], [address] Phone: [phone]   Fax:  [phone]  Physical Therapy Treatment/Re-evaluation  Patient Details  Name: Joshua Stephenson MRN: [id number] Date of Birth: 05-19-1956 Referring Provider: Dorothyann Peng   Encounter Date: 05/17/2018  PT End of Session - 05/17/18 1147    Visit Number  20    Number of Visits  38    Date for PT Re-Evaluation  05/17/18    Authorization Type  [employer] PPO     PT Start Time  1103    PT Stop Time  1147    PT Time Calculation (min)  44 min    Activity Tolerance  Patient tolerated treatment well;Patient limited by pain    Behavior During Therapy  [facility] for tasks assessed/performed       Past Medical History:  Diagnosis Date  . Anxiety   . Asthma    as a child  . 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 GERD (gastroesophageal reflux disease)   . Hemorrhoids     Past Surgical History:  Procedure Laterality Date  . COLONOSCOPY  2011  . SHOULDER ARTHROSCOPY W/ ROTATOR CUFF REPAIR Right 02/08/2018    There were no vitals filed for this visit.  Subjective Assessment - 05/17/18 1117    Subjective  Pt reports work wants to return to work on 9/22. 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 Repairs bus seats. Reports severe pain in bilateral shoulders and signficiant fatigue in arms. My surgeon said I do not need to be in the Lt sling any more.     Patient Stated Goals  reduce pain, be able to go back to work     Currently in Pain?  Yes    Pain Score  5     Pain Location  Shoulder    Pain Orientation  Left    Aggravating Factors   use of arm    Pain Relieving Factors  heat         [facility] PT Assessment - 05/17/18 0001      Assessment   Medical Diagnosis  Lt RCR    Referring Provider  Dorothyann Peng    Onset Date/Surgical Date  04/10/18       Precautions   Precaution Comments  follow [place] [facility] protocol       Restrictions   RUE Weight Bearing  Non weight bearing      Balance Screen   Has the patient fallen in the past 6 months  No    Has the patient had a decrease in activity level because of a fear of falling?   Yes    Is the patient reluctant to leave their home because of a fear of falling?   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 Can weight up to 50#. Constantly moving/pushing pallet.     Leisure  watching football/sports, outside tasks       Sensation   Additional Comments  constant tingling bilateral 3rd & 4th digits      ROM /  Strength   AROM / PROM / Strength  Strength      AROM   Right/Left Shoulder  Left   no AROM per protocol at this time     PROM   Right/Left Shoulder  Left   all painful, empty end feels   Left Shoulder Flexion  113 Degrees    Left Shoulder ABduction  68 Degrees    Left Shoulder External Rotation  45 Degrees   at 45 abd     Strength   Overall Strength Comments  not appropriate to test per protocol    Strength Assessment Site  Hand    Right/Left hand  Right;Left    Right Hand Grip (lbs)  20    Left Hand Grip (lbs)  25                   [facility] Adult PT Treatment/Exercise - 05/17/18 0001      Exercises   Exercises  Shoulder      Pilates   Pilates Tower  3      Shoulder Exercises: Seated   Other Seated Exercises  scap retraction      Shoulder Exercises: Stretch   Other Shoulder Stretches  upper trap & levator stretch             PT Education - 05/17/18 1411    Education provided  Yes    Education Details  anatomy of condition, POC, HEP, exercise  form/rationale, protocol progression, return to work    [employer]) Educated  Patient    Methods  Explanation;Handout    Comprehension  Verbalized understanding;Need further instruction       PT Short Term Goals - 04/28/18 1554      PT SHORT TERM GOAL #1   Title  Patient to demonstrate R shoulder PROM as being full on all measured planes in order to show improved shoulder mobility     Baseline  7/30- progresing, limited by pain     Time  4    Period  Weeks    Status  On-going      PT SHORT TERM GOAL #2   Title  Patient to demonstrate R shoulder AROM as being full on all measured planes in order to show improved shoulder mobility and tolerance to activity     Baseline  7/30- limited by pain     Time  4    Period  Weeks    Status  On-going      PT SHORT TERM GOAL #3   Title  Patient to be able to maintain correct functional posture at least 75% of the time in order to assist in maintaining correct shoulder mechanics and reducing pain     Baseline  7/30- improved but somewhat limited     Time  4    Period  Weeks    Status  On-going      PT SHORT TERM GOAL #4   Title  Patient to be compliant with appropriate HEP, to be updated PRN     Baseline  7/30- compliant, wife assist     Time  1    Period  Weeks    Status  On-going        PT Long Term Goals - 05/17/18 1405      PT LONG TERM GOAL #1   Title  Patient to demonstrate R shoulder strength as being at least 4/5 in all tested groups in order to show improving functional strength and to  reduce pain     Baseline  7/30- roughly 2+/5 to 3-/5 pain limited by gross assessment     Status  On-going    Target Date  04/13/18      PT LONG TERM GOAL #2   Title  Patient to be able to reach overhead with R shoulder without scapular winging or upper trap compensation pattern in order to show improved shoulder mechanics     Baseline  7/30- DNT     Status  On-going      PT LONG TERM GOAL #3   Title  Patient to report pain as being no  more than 2/10 R shoulder in order to improve QOL and functional task tolerance     Baseline  7/30- 4-5/10     Status  On-going      PT LONG TERM GOAL #4   Title  Patient to be able to perform all self care tasks with R shoulder independently with no increase in pain in order to show improved self-efficacy     Baseline  7/30- ongoing     Status  On-going      PT LONG TERM GOAL #5   Title  Lt shoulder pain <=2/10 with ADLs    Baseline  severe pain and limited in use for ADLs on 8/27    Time  8    Period  Weeks    Status  New    Target Date  07/15/18      Additional Long Term Goals   Additional Long Term Goals  Yes      PT LONG TERM GOAL #6   Title  Pt will demo proper biomechanical chain movement in overhead reaching and movements     Baseline  unable at ERO    Time  8    Period  Weeks    Status  New    Target Date  07/15/18      PT LONG TERM GOAL #7   Title  Pt will begin training appropriate return to work activities pain <=3/10    Baseline  unable at ERO    Time  8    Period  Weeks    Status  New    Target Date  07/15/18            Plan - 05/17/18 1356    Clinical Impression Statement  Assessed left shoulder today to add rehab to POC. Pt is 5 weeks post op RCR on Left. Unable to test strength and AROM today due to protocol restrictions. PROM measures are near limits but are painful with empty end feels. At this time pt should be significantly limiting use of Lt UE but is required to during functional ADLs due to recent RCR on Rt shoulder as well. Provided pt with copy of RCR rehab protocol so he can be reminded of limitations as time progresses forward. Advised him to use his Lt arm as little as possible. Bilateral upper trap tightness but to shoulder elevation as result of pain. trigger point found on left side recreated some of the tingling in his left hand. Pt will benefit from skilled PT in order to improve functional ROM, strength and use of Left upper extremity and  progress appropriately through post op protocol.     History and Personal Factors relevant to plan of care:  bilat shoulder RCR; Rt SAD, distal clavicle, labral debridement    Clinical Presentation  Evolving    Clinical Presentation due to:  acute  stages of post op    PT Frequency  2x / week    PT Duration  8 weeks    PT Treatment/Interventions  ADLs/Self Care Home Management;Biofeedback;Cryotherapy;Electrical Stimulation;Iontophoresis 4mg /ml Dexamethasone;Moist Heat;Traction;Ultrasound;Functional mobility training;Therapeutic activities;Therapeutic exercise;Balance training;Neuromuscular re-education;Patient/family education;Manual techniques;Scar mobilization;Passive range of motion;Dry needling;Energy conservation;Taping    PT Next Visit Plan  STM bil upper traps, pain control bilat, consider ktape, review isometrics, PROM- see protocol      update LTGs for Rt shoulder    PT Home Exercise Plan  Eval: grip squeezes, wrist flexion/extension, elbow flexion/extension, horizontal and vertical PROM pendulums standing , yellow band Rt shoulder scap retract and extension    Consulted and Agree with Plan of Care  Patient       Patient will benefit from skilled therapeutic intervention in order to improve the following deficits and impairments:  Decreased skin integrity, Increased fascial restricitons, Pain, Decreased mobility, Decreased scar mobility, Increased muscle spasms, Postural dysfunction, Decreased activity tolerance, Decreased range of motion, Decreased strength, Hypomobility, Impaired perceived functional ability, Impaired UE functional use, Decreased safety awareness, Impaired flexibility  Visit Diagnosis: Stiffness of right shoulder, not elsewhere classified - Plan: PT plan of care cert/re-cert  Acute pain of right shoulder - Plan: PT plan of care cert/re-cert  Muscle weakness (generalized) - Plan: PT plan of care cert/re-cert  Abnormal posture - Plan: PT plan of care  cert/re-cert  Acute pain of left shoulder - Plan: PT plan of care cert/re-cert  Stiffness of left shoulder, not elsewhere classified - Plan: PT plan of care cert/re-cert     Problem List Patient Active Problem List   Diagnosis Date Noted  . 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 Smoker 06/07/2014  . GERD (gastroesophageal reflux disease) 06/07/2014  . Hemorrhoid 05/27/2014    Zorawar Strollo C. Oline Belk PT, DPT 05/17/18 2:13 PM   [facility] [address] [place], [place], [address] Phone: [phone]   Fax:  [phone]  Name: Fedrick Cefalu MRN: [id number] Date of Birth: June 11, 1956

## 2018-05-19 ENCOUNTER — Ambulatory Visit: Payer: Commercial Managed Care - PPO | Admitting: Physical Therapy

## 2018-05-19 ENCOUNTER — Encounter: Payer: Self-pay | Admitting: Physical Therapy

## 2018-05-19 DIAGNOSIS — M25611 Stiffness of right shoulder, not elsewhere classified: Secondary | ICD-10-CM | POA: Diagnosis not present

## 2018-05-19 DIAGNOSIS — M6281 Muscle weakness (generalized): Secondary | ICD-10-CM

## 2018-05-19 DIAGNOSIS — M25511 Pain in right shoulder: Secondary | ICD-10-CM

## 2018-05-19 NOTE — Therapy (Signed)
[place], [place], [address] Phone: [phone]   Fax:  [phone]  Physical Therapy Treatment  Patient Details  Name: Joshua Stephenson MRN: [id number] Date of Birth: [birth year] Referring Provider: Dorothyann Peng   Encounter Date: 05/19/2018  PT End of Session - 05/19/18 1128    Visit Number  21    Number of Visits  38    Date for PT Re-Evaluation  07/15/18    Authorization Type  [employer] PPO     Authorization Time Period  6/54/65 to 0/35/46; recert done 5/68/12; recert done 7/51/7001    PT Start Time  1123   23 minutes late    PT Stop Time  1208    PT Time Calculation (min)  45 min       Past Medical History:  Diagnosis Date  . Anxiety   . Asthma    as a child  . 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 GERD (gastroesophageal reflux disease)   . Hemorrhoids     Past Surgical History:  Procedure Laterality Date  . COLONOSCOPY  2011  . SHOULDER ARTHROSCOPY W/ ROTATOR CUFF REPAIR Right 02/08/2018    There were no vitals filed for this visit.  Subjective Assessment - 05/19/18 1126    Subjective  Certain movements especially showering/washing body cause an increase in pain. Noticed less pain in fingers, but still in right arm.     Limitations  Other (comment)    Currently in Pain?  Yes    Pain Score  5     Pain Location  Shoulder    Pain Orientation  Right;Left    Pain Descriptors / Indicators  Aching    Pain Radiating Towards  down to right elbow, down to left hand and fingers    Aggravating Factors   use of arms, showering, bathing    Pain Relieving Factors  heat, rest                        [facility] Adult PT Treatment/Exercise - 05/19/18 0001      Exercises   Exercises  Shoulder      Shoulder Exercises: Supine   Protraction  10 reps;Right    Other Supine Exercises  supine dowel pullovers small ROM with cues to keep comfortable , pressups with dowel x 10 , ER  AAROM x 10        Shoulder Exercises: Standing   External Rotation Limitations  R only x 10 yellow    Internal Rotation Limitations  R only x 10 yellow    Other Standing Exercises  Right wall slide for flexion x 10 , bilateral 2# bicep curls 10 x 2     Other Standing Exercises  standing scap retract x 10, then bilateral yellow band rows and extension to neutral (small rom for extension due to pain)       Shoulder Exercises: Pulleys   Flexion  2 minutes      Shoulder Exercises: Isometric Strengthening   Other Isometric Exercises  Lt only 5 sec x 10 flexion, IR, ER, extension      Shoulder Exercises: Stretch   Other Shoulder Stretches  upper trap & levator stretch      Manual Therapy   Soft tissue mobilization  Upper traps , levator     Passive ROM  Bilateral Flex, Abdct, IR ER cues for relaxation throughout  PT Short Term Goals - 04/28/18 1554      PT SHORT TERM GOAL #1   Title  Patient to demonstrate R shoulder PROM as being full on all measured planes in order to show improved shoulder mobility     Baseline  7/30- progresing, limited by pain     Time  4    Period  Weeks    Status  On-going      PT SHORT TERM GOAL #2   Title  Patient to demonstrate R shoulder AROM as being full on all measured planes in order to show improved shoulder mobility and tolerance to activity     Baseline  7/30- limited by pain     Time  4    Period  Weeks    Status  On-going      PT SHORT TERM GOAL #3   Title  Patient to be able to maintain correct functional posture at least 75% of the time in order to assist in maintaining correct shoulder mechanics and reducing pain     Baseline  7/30- improved but somewhat limited     Time  4    Period  Weeks    Status  On-going      PT SHORT TERM GOAL #4   Title  Patient to be compliant with appropriate HEP, to be updated PRN     Baseline  7/30- compliant, wife assist     Time  1    Period  Weeks    Status  On-going        PT Long Term  Goals - 05/17/18 1405      PT LONG TERM GOAL #1   Title  Patient to demonstrate R shoulder strength as being at least 4/5 in all tested groups in order to show improving functional strength and to reduce pain     Baseline  7/30- roughly 2+/5 to 3-/5 pain limited by gross assessment     Status  On-going    Target Date  04/13/18      PT LONG TERM GOAL #2   Title  Patient to be able to reach overhead with R shoulder without scapular winging or upper trap compensation pattern in order to show improved shoulder mechanics     Baseline  7/30- DNT     Status  On-going      PT LONG TERM GOAL #3   Title  Patient to report pain as being no more than 2/10 R shoulder in order to improve QOL and functional task tolerance     Baseline  7/30- 4-5/10     Status  On-going      PT LONG TERM GOAL #4   Title  Patient to be able to perform all self care tasks with R shoulder independently with no increase in pain in order to show improved self-efficacy     Baseline  7/30- ongoing     Status  On-going      PT LONG TERM GOAL #5   Title  Lt shoulder pain <=2/10 with ADLs    Baseline  severe pain and limited in use for ADLs on 8/27    Time  8    Period  Weeks    Status  New    Target Date  07/15/18      Additional Long Term Goals   Additional Long Term Goals  Yes      PT LONG TERM GOAL #6   Title  Pt will demo  proper biomechanical chain movement in overhead reaching and movements     Baseline  unable at ERO    Time  8    Period  Weeks    Status  New    Target Date  07/15/18      PT LONG TERM GOAL #7   Title  Pt will begin training appropriate return to work activities pain <=3/10    Baseline  unable at ERO    Time  8    Period  Weeks    Status  New    Target Date  07/15/18            Plan - 05/19/18 1134    Clinical Impression Statement  Pt arrived 23 minutes late for appt. instructed pt in isometric strengthening/AAROM for Lt shoulder and performed PROM bilateral  as tolerated. Pt  limited by pain. Instructed pt in yellow band strengthening and AAROM Rt shoulder. Pt declined modalities post treatment,     PT Next Visit Plan  STM bil upper traps, pain control bilat, consider ktape, review isometrics, PROM- see protocol      update LTGs for Rt shoulder    PT Home Exercise Plan  Eval: grip squeezes, wrist flexion/extension, elbow flexion/extension, horizontal and vertical PROM pendulums standing , yellow band Rt shoulder scap retract and extension    Consulted and Agree with Plan of Care  Patient       Patient will benefit from skilled therapeutic intervention in order to improve the following deficits and impairments:  Decreased skin integrity, Increased fascial restricitons, Pain, Decreased mobility, Decreased scar mobility, Increased muscle spasms, Postural dysfunction, Decreased activity tolerance, Decreased range of motion, Decreased strength, Hypomobility, Impaired perceived functional ability, Impaired UE functional use, Decreased safety awareness, Impaired flexibility  Visit Diagnosis: Stiffness of right shoulder, not elsewhere classified  Acute pain of right shoulder  Muscle weakness (generalized)     Problem List Patient Active Problem List   Diagnosis Date Noted  . 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 Smoker 06/07/2014  . GERD (gastroesophageal reflux disease) 06/07/2014  . Hemorrhoid 05/27/2014    Dorene Ar, PTA 05/19/2018, 12:14 PM  [place], [place], [address] Phone: [phone]   Fax:  [phone]  Name: Joshua Stephenson MRN: [id number] Date of Birth: [birth year]

## 2018-05-21 ENCOUNTER — Inpatient Hospital Stay: Admission: RE | Admit: 2018-05-21 | Payer: Commercial Managed Care - PPO | Source: Ambulatory Visit

## 2018-05-24 ENCOUNTER — Ambulatory Visit: Payer: Commercial Managed Care - PPO | Attending: Adult Health | Admitting: Physical Therapy

## 2018-05-24 ENCOUNTER — Encounter: Payer: Self-pay | Admitting: Physical Therapy

## 2018-05-24 DIAGNOSIS — M25512 Pain in left shoulder: Secondary | ICD-10-CM | POA: Diagnosis present

## 2018-05-24 DIAGNOSIS — M25611 Stiffness of right shoulder, not elsewhere classified: Secondary | ICD-10-CM

## 2018-05-24 DIAGNOSIS — M62838 Other muscle spasm: Secondary | ICD-10-CM | POA: Insufficient documentation

## 2018-05-24 DIAGNOSIS — M25511 Pain in right shoulder: Secondary | ICD-10-CM | POA: Diagnosis present

## 2018-05-24 DIAGNOSIS — M542 Cervicalgia: Secondary | ICD-10-CM | POA: Diagnosis present

## 2018-05-24 DIAGNOSIS — M25612 Stiffness of left shoulder, not elsewhere classified: Secondary | ICD-10-CM | POA: Diagnosis present

## 2018-05-24 DIAGNOSIS — M6281 Muscle weakness (generalized): Secondary | ICD-10-CM | POA: Diagnosis present

## 2018-05-24 DIAGNOSIS — R293 Abnormal posture: Secondary | ICD-10-CM | POA: Diagnosis present

## 2018-05-24 NOTE — Therapy (Signed)
[place], [place], [address] Phone: [phone]   Fax:  [phone]  Physical Therapy Treatment  Patient Details  Name: Joshua Stephenson MRN: [id number] Date of Birth: Dec 18, 1955 Referring Provider: Dorothyann Peng   Encounter Date: 05/24/2018  PT End of Session - 05/24/18 0850    Visit Number  22    Number of Visits  38    Date for PT Re-Evaluation  07/15/18    Authorization Type  [employer] PPO     PT Start Time  0845    PT Stop Time  0929    PT Time Calculation (min)  44 min       Past Medical History:  Diagnosis Date  . Anxiety   . Asthma    as a child  . 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 GERD (gastroesophageal reflux disease)   . Hemorrhoids     Past Surgical History:  Procedure Laterality Date  . COLONOSCOPY  2011  . SHOULDER ARTHROSCOPY W/ ROTATOR CUFF REPAIR Right 02/08/2018    There were no vitals filed for this visit.  Subjective Assessment - 05/24/18 0849    Currently in Pain?  Yes    Pain Score  6    4/10 right shoulder   Pain Location  Shoulder    Pain Orientation  Left    Pain Descriptors / Indicators  Sore    Aggravating Factors   use of arms, showering, bathing, reaching    Pain Relieving Factors  heat, rest, hot shower         [facility] PT Assessment - 05/24/18 0001      PROM   Right/Left Shoulder  Right;Left    Right Shoulder Flexion  145 Degrees    Right Shoulder ABduction  145 Degrees    Right Shoulder Internal Rotation  65 Degrees    Right Shoulder External Rotation  65 Degrees    Left Shoulder Flexion  125 Degrees    Left Shoulder ABduction  110 Degrees    Left Shoulder Internal Rotation  45 Degrees   @ 45 degrees abduction   Left Shoulder External Rotation  50 Degrees   @ 35 degrees abduction                  [facility] Adult PT Treatment/Exercise - 05/24/18 0001      Shoulder Exercises: Supine   Other Supine Exercises  supine dowel pullovers  small ROM with cues to keep comfortable , pressups with dowel x 10 , ER  AAROM x 10       Shoulder Exercises: Standing   External Rotation Limitations  R only x 15 yellow    Internal Rotation Limitations  R only x 15 yellow    Other Standing Exercises  Right wall slide for flexion x 12 , bilateral 2# bicep curls 10 x 2     Other Standing Exercises  bilateral yellow band rows and extension to neutral (small rom for extension due to pain)       Shoulder Exercises: Pulleys   Flexion  2 minutes      Shoulder Exercises: Isometric Strengthening   Other Isometric Exercises  Lt only 5 sec x 10 flexion, IR, ER, extension      Shoulder Exercises: Stretch   Other Shoulder Stretches  upper trap & levator stretch      Manual Therapy   Soft tissue mobilization  Upper traps , levator     Passive ROM  Bilateral  Flex, Abdct, IR ER cues for relaxation throughout             PT Education - 05/24/18 0906    Education provided  Yes    Education Details  HEP    Person(s) Educated  Patient    Methods  Explanation;Handout    Comprehension  Verbalized understanding       PT Short Term Goals - 05/24/18 0930      PT SHORT TERM GOAL #1   Title  Patient to demonstrate R shoulder PROM as being full on all measured planes in order to show improved shoulder mobility     Baseline  05/24/2018-see objective measures, progressing     Time  4    Period  Weeks    Status  On-going      PT SHORT TERM GOAL #2   Title  Patient to demonstrate R shoulder AROM as being full on all measured planes in order to show improved shoulder mobility and tolerance to activity     Baseline  05/24/2018- limited by pain    Time  4    Period  Weeks    Status  On-going      PT SHORT TERM GOAL #3   Title  Patient to be able to maintain correct functional posture at least 75% of the time in order to assist in maintaining correct shoulder mechanics and reducing pain     Baseline  05/24/2018- needs cuing    Period  Weeks     Status  On-going      PT SHORT TERM GOAL #4   Title  Patient to be compliant with appropriate HEP, to be updated PRN     Baseline  05/24/2018 - compliant, wife assist    Time  1    Period  Weeks    Status  Achieved        PT Long Term Goals - 05/17/18 1405      PT LONG TERM GOAL #1   Title  Patient to demonstrate R shoulder strength as being at least 4/5 in all tested groups in order to show improving functional strength and to reduce pain     Baseline  7/30- roughly 2+/5 to 3-/5 pain limited by gross assessment     Status  On-going    Target Date  04/13/18      PT LONG TERM GOAL #2   Title  Patient to be able to reach overhead with R shoulder without scapular winging or upper trap compensation pattern in order to show improved shoulder mechanics     Baseline  7/30- DNT     Status  On-going      PT LONG TERM GOAL #3   Title  Patient to report pain as being no more than 2/10 R shoulder in order to improve QOL and functional task tolerance     Baseline  7/30- 4-5/10     Status  On-going      PT LONG TERM GOAL #4   Title  Patient to be able to perform all self care tasks with R shoulder independently with no increase in pain in order to show improved self-efficacy     Baseline  7/30- ongoing     Status  On-going      PT LONG TERM GOAL #5   Title  Lt shoulder pain <=2/10 with ADLs    Baseline  severe pain and limited in use for ADLs on 8/27    Time  8    Period  Weeks    Status  New    Target Date  07/15/18      Additional Long Term Goals   Additional Long Term Goals  Yes      PT LONG TERM GOAL #6   Title  Pt will demo proper biomechanical chain movement in overhead reaching and movements     Baseline  unable at ERO    Time  8    Period  Weeks    Status  New    Target Date  07/15/18      PT LONG TERM GOAL #7   Title  Pt will begin training appropriate return to work activities pain <=3/10    Baseline  unable at ERO    Time  8    Period  Weeks    Status  New     Target Date  07/15/18            Plan - 05/24/18 0857    Clinical Impression Statement  Pt reports feeling good after last treatment. He did note increased swelling in bicep area after last treatment. he reports 6/10 soreness in left shoulder and 4/10 soreness in right shoulder today. He is unsure of anything that increased his left shoulder pain today. Continued with isometric strengthening for the left shoulder and yellow band strengthening for the right shoulder, ROM for bioh shoulders. He is limited by pain with PROM and demonstrates empty end feels. Updated HEP with isometrics for left and yellow band IR/ER for right. Pt reprots his wife has sent a picture of his left shoulder to his case worker and the MD may order and MRI due to swelling.     PT Next Visit Plan  STM bil upper traps, pain control bilat, consider ktape, review isometrics, PROM- see protocol      update LTGs for Rt shoulder    PT Home Exercise Plan  Eval: grip squeezes, wrist flexion/extension, elbow flexion/extension, horizontal and vertical PROM pendulums standing , yellow band Rt shoulder scap retract and extension; left shoulder isometrics, right shoulder IR/ER with yellow band     Consulted and Agree with Plan of Care  Patient       Patient will benefit from skilled therapeutic intervention in order to improve the following deficits and impairments:  Decreased skin integrity, Increased fascial restricitons, Pain, Decreased mobility, Decreased scar mobility, Increased muscle spasms, Postural dysfunction, Decreased activity tolerance, Decreased range of motion, Decreased strength, Hypomobility, Impaired perceived functional ability, Impaired UE functional use, Decreased safety awareness, Impaired flexibility  Visit Diagnosis: Stiffness of right shoulder, not elsewhere classified  Acute pain of right shoulder  Muscle weakness (generalized)  Abnormal posture  Acute pain of left shoulder  Stiffness of left shoulder,  not elsewhere classified     Problem List Patient Active Problem List   Diagnosis Date Noted  . 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 Smoker 06/07/2014  . GERD (gastroesophageal reflux disease) 06/07/2014  . Hemorrhoid 05/27/2014    Dorene Ar, PTA 05/24/2018, 9:33 AM  [facility] [address] [place], [place], [address] Phone: [phone]   Fax:  [phone]  Name: Jammal Sarr MRN: [id number] Date of Birth: 05/05/1956

## 2018-05-24 NOTE — Patient Instructions (Addendum)
FOR LEFT SHOULDER ONLY:  Strengthening: Isometric Flexion  Using wall for resistance, press right fist into ball using light pressure. Hold __5__ seconds. Repeat __10__ times per set. Do __1__ sets per session. Do __2__ sessions per day.   Extension (Isometric)  Place left bent elbow and back of arm against wall. Press elbow against wall. Hold _5___ seconds. Repeat __10__ times. Do __2__ sessions per day.  Internal Rotation (Isometric)  Place palm of right fist against door frame, with elbow bent. Press fist against door frame. Hold _5___ seconds. Repeat ___10_ times. Do __2__ sessions per day.  External Rotation (Isometric)  Place back of left fist against door frame, with elbow bent. Press fist against door frame. Hold __5__ seconds. Repeat __10__ times. Do __2__ sessions per day.  FOR RIGHT SHOULDER ONLY:  Strengthening: Resisted Internal Rotation   Hold tubing in RIGHT  hand, elbow at side and forearm out. Rotate forearm in across body. Repeat __10__ times per set. Do __2__ sets per session. Do __2__ sessions per day.  http://orth.exer.us/830   Copyright  VHI. All rights reserved.  Strengthening: Resisted External Rotation   Hold tubing in right hand, elbow at side and forearm across body. Rotate forearm out. Repeat _10___ times per set. Do __2__ sets per session. Do _2___ sessions per day.

## 2018-05-30 ENCOUNTER — Ambulatory Visit
Admission: RE | Admit: 2018-05-30 | Discharge: 2018-05-30 | Disposition: A | Discharge status: Home / Self Care | Payer: Commercial Managed Care - PPO | Source: Ambulatory Visit | Attending: Orthopaedic Surgery | Admitting: Orthopaedic Surgery

## 2018-05-30 DIAGNOSIS — M542 Cervicalgia: Secondary | ICD-10-CM

## 2018-05-31 ENCOUNTER — Ambulatory Visit: Payer: Commercial Managed Care - PPO | Admitting: Physical Therapy

## 2018-05-31 ENCOUNTER — Encounter: Payer: Self-pay | Admitting: Physical Therapy

## 2018-05-31 DIAGNOSIS — M25611 Stiffness of right shoulder, not elsewhere classified: Secondary | ICD-10-CM | POA: Diagnosis not present

## 2018-05-31 DIAGNOSIS — R197 Diarrhea, unspecified: Secondary | ICD-10-CM | POA: Insufficient documentation

## 2018-05-31 DIAGNOSIS — M6281 Muscle weakness (generalized): Secondary | ICD-10-CM

## 2018-05-31 DIAGNOSIS — M25511 Pain in right shoulder: Secondary | ICD-10-CM

## 2018-05-31 DIAGNOSIS — M25612 Stiffness of left shoulder, not elsewhere classified: Secondary | ICD-10-CM

## 2018-05-31 DIAGNOSIS — R293 Abnormal posture: Secondary | ICD-10-CM

## 2018-05-31 DIAGNOSIS — M25512 Pain in left shoulder: Secondary | ICD-10-CM

## 2018-05-31 NOTE — Therapy (Signed)
[place], [place], [address] Phone: [phone]   Fax:  [phone]  Physical Therapy Treatment  Patient Details  Name: Joshua Stephenson MRN: [id number] Date of Birth: 20-Jan-1956 Referring Provider: Dorothyann Peng   Encounter Date: 05/31/2018  PT End of Session - 05/31/18 1104    Visit Number  23    Number of Visits  38    Date for PT Re-Evaluation  07/15/18    Authorization Type  [employer] PPO     Authorization Time Period  --    PT Start Time  1100    PT Stop Time  1145    PT Time Calculation (min)  45 min       Past Medical History:  Diagnosis Date  . Anxiety   . Asthma    as a child  . 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 GERD (gastroesophageal reflux disease)   . Hemorrhoids     Past Surgical History:  Procedure Laterality Date  . COLONOSCOPY  2011  . SHOULDER ARTHROSCOPY W/ ROTATOR CUFF REPAIR Right 02/08/2018    There were no vitals filed for this visit.  Subjective Assessment - 05/31/18 1103    Subjective  Sore, across top of shoulders and down both arms. Had an MRI of neck yesterday. Getting medical records for the doctor.     Currently in Pain?  Yes    Pain Score  6     Pain Location  Shoulder    Pain Orientation  Right;Left    Pain Descriptors / Indicators  Sore    Aggravating Factors   showering, grooming, reaching    Pain Relieving Factors  heat, rest, hot shower         [facility] PT Assessment - 05/31/18 0001      PROM   Left Shoulder Flexion  130 Degrees    Left Shoulder ABduction  120 Degrees    Left Shoulder Internal Rotation  45 Degrees   @ 45 degrees abduction   Left Shoulder External Rotation  55 Degrees   @ 35 degrees abduction                  [facility] Adult PT Treatment/Exercise - 05/31/18 0001      Shoulder Exercises: Supine   Other Supine Exercises  --      Shoulder Exercises: Standing   External Rotation Limitations  R only x 20 yellow    Internal Rotation Limitations  R only x 20 yellow    Other Standing Exercises  Right wall slide for flexion x 12 , bilateral 2# bicep curls 10 x 2     Other Standing Exercises  bilateral yellow band rows and extension to neutral (small rom for extension due to pain) x 20 each       Shoulder Exercises: Pulleys   Flexion  2 minutes      Shoulder Exercises: Isometric Strengthening   Other Isometric Exercises  Lt only 5 sec x 10 flexion, IR, ER, extension      Shoulder Exercises: Stretch   Other Shoulder Stretches  upper trap & levator stretch      Electrical Stimulation   Electrical Stimulation Location  bilateral upper traps/posterior shoulder     Electrical Stimulation Action  IFC x 15 minutes     Electrical Stimulation Parameters  10 ma    Electrical Stimulation Goals  Pain      Manual Therapy   Soft tissue mobilization  --  Passive ROM  Bilateral Flex, Abdct, IR ER cues for relaxation throughout               PT Short Term Goals - 05/24/18 0930      PT SHORT TERM GOAL #1   Title  Patient to demonstrate R shoulder PROM as being full on all measured planes in order to show improved shoulder mobility     Baseline  05/24/2018-see objective measures, progressing     Time  4    Period  Weeks    Status  On-going      PT SHORT TERM GOAL #2   Title  Patient to demonstrate R shoulder AROM as being full on all measured planes in order to show improved shoulder mobility and tolerance to activity     Baseline  05/24/2018- limited by pain    Time  4    Period  Weeks    Status  On-going      PT SHORT TERM GOAL #3   Title  Patient to be able to maintain correct functional posture at least 75% of the time in order to assist in maintaining correct shoulder mechanics and reducing pain     Baseline  05/24/2018- needs cuing    Period  Weeks    Status  On-going      PT SHORT TERM GOAL #4   Title  Patient to be compliant with appropriate HEP, to be updated PRN     Baseline  05/24/2018 -  compliant, wife assist    Time  1    Period  Weeks    Status  Achieved        PT Long Term Goals - 05/17/18 1405      PT LONG TERM GOAL #1   Title  Patient to demonstrate R shoulder strength as being at least 4/5 in all tested groups in order to show improving functional strength and to reduce pain     Baseline  7/30- roughly 2+/5 to 3-/5 pain limited by gross assessment     Status  On-going    Target Date  04/13/18      PT LONG TERM GOAL #2   Title  Patient to be able to reach overhead with R shoulder without scapular winging or upper trap compensation pattern in order to show improved shoulder mechanics     Baseline  7/30- DNT     Status  On-going      PT LONG TERM GOAL #3   Title  Patient to report pain as being no more than 2/10 R shoulder in order to improve QOL and functional task tolerance     Baseline  7/30- 4-5/10     Status  On-going      PT LONG TERM GOAL #4   Title  Patient to be able to perform all self care tasks with R shoulder independently with no increase in pain in order to show improved self-efficacy     Baseline  7/30- ongoing     Status  On-going      PT LONG TERM GOAL #5   Title  Lt shoulder pain <=2/10 with ADLs    Baseline  severe pain and limited in use for ADLs on 8/27    Time  8    Period  Weeks    Status  New    Target Date  07/15/18      Additional Long Term Goals   Additional Long Term Goals  Yes  PT LONG TERM GOAL #6   Title  Pt will demo proper biomechanical chain movement in overhead reaching and movements     Baseline  unable at ERO    Time  8    Period  Weeks    Status  New    Target Date  07/15/18      PT LONG TERM GOAL #7   Title  Pt will begin training appropriate return to work activities pain <=3/10    Baseline  unable at ERO    Time  8    Period  Weeks    Status  New    Target Date  07/15/18            Plan - 05/31/18 1150    Clinical Impression Statement  Pt reports he had a MRI of his cervical spine  yesterday. He is requesting medical records for MD to assess the need to change his RTW date. He continues to report 6/10 pain across upper trap and into bilateral upper arms. PROM limited by pain and guarding. Trial of IFC with HMP to bilateral upper traps to decrease pain. At end of session he reports he cannot feel any pain.     PT Next Visit Plan  assess response to IFC -giive TENS info, STM bil upper traps, pain control bilat, consider ktape, progress to Left shoulde bands per protocol; PROM- see protocol      update LTGs for Rt shoulder    PT Home Exercise Plan  Eval: grip squeezes, wrist flexion/extension, elbow flexion/extension, horizontal and vertical PROM pendulums standing , yellow band Rt shoulder scap retract and extension; left shoulder isometrics, right shoulder IR/ER with yellow band     Consulted and Agree with Plan of Care  Patient       Patient will benefit from skilled therapeutic intervention in order to improve the following deficits and impairments:  Decreased skin integrity, Increased fascial restricitons, Pain, Decreased mobility, Decreased scar mobility, Increased muscle spasms, Postural dysfunction, Decreased activity tolerance, Decreased range of motion, Decreased strength, Hypomobility, Impaired perceived functional ability, Impaired UE functional use, Decreased safety awareness, Impaired flexibility  Visit Diagnosis: Stiffness of right shoulder, not elsewhere classified  Acute pain of right shoulder  Muscle weakness (generalized)  Abnormal posture  Stiffness of left shoulder, not elsewhere classified  Acute pain of left shoulder     Problem List Patient Active Problem List   Diagnosis Date Noted  . Diarrhea 05/31/2018  . 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 Smoker 06/07/2014  . GERD (gastroesophageal reflux disease) 06/07/2014  . Hemorrhoid 05/27/2014    Dorene Ar, PTA 05/31/2018, 12:17 PM  [place], [place], [address] Phone: [phone]   Fax:  [phone]  Name: Joshua Stephenson MRN: [id number] Date of Birth: October 18, 1955

## 2018-06-01 ENCOUNTER — Ambulatory Visit: Payer: Commercial Managed Care - PPO | Admitting: Physical Therapy

## 2018-06-01 ENCOUNTER — Encounter: Payer: Self-pay | Admitting: Physical Therapy

## 2018-06-01 DIAGNOSIS — M25511 Pain in right shoulder: Secondary | ICD-10-CM

## 2018-06-01 DIAGNOSIS — M6281 Muscle weakness (generalized): Secondary | ICD-10-CM

## 2018-06-01 DIAGNOSIS — R293 Abnormal posture: Secondary | ICD-10-CM

## 2018-06-01 DIAGNOSIS — M25512 Pain in left shoulder: Secondary | ICD-10-CM

## 2018-06-01 DIAGNOSIS — M25612 Stiffness of left shoulder, not elsewhere classified: Secondary | ICD-10-CM

## 2018-06-01 DIAGNOSIS — M25611 Stiffness of right shoulder, not elsewhere classified: Secondary | ICD-10-CM

## 2018-06-01 NOTE — Therapy (Signed)
[place], [place], [address] Phone: [phone]   Fax:  [phone]  Physical Therapy Treatment  Patient Details  Name: Joshua Stephenson MRN: [id number] Date of Birth: 18-Mar-1956 Referring Provider: Dorothyann Peng   Encounter Date: 06/01/2018  PT End of Session - 06/01/18 1203    Visit Number  24    Number of Visits  38    Date for PT Re-Evaluation  07/15/18    PT Start Time  1018    PT Stop Time  1059    PT Time Calculation (min)  41 min    Activity Tolerance  Patient tolerated treatment well    Behavior During Therapy  [facility] for tasks assessed/performed       Past Medical History:  Diagnosis Date  . Anxiety   . Asthma    as a child  . 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 GERD (gastroesophageal reflux disease)   . Hemorrhoids     Past Surgical History:  Procedure Laterality Date  . COLONOSCOPY  2011  . SHOULDER ARTHROSCOPY W/ ROTATOR CUFF REPAIR Right 02/08/2018    There were no vitals filed for this visit.  Subjective Assessment - 06/01/18 1020    Subjective   Did not sleep last night.  I do the exercises.  PT helps ease the pain a little    Currently in Pain?  Yes    Pain Score  6     Pain Location  Shoulder    Pain Orientation  Right;Left    Pain Descriptors / Indicators  Tightness;Sore    Pain Type  Chronic pain    Pain Radiating Towards  down to right elbow,  left hand and fingers    Aggravating Factors   reaching sleeping ADL    Pain Relieving Factors  heat,  rest ,  hot shower    Effect of Pain on Daily Activities  can't scratch back,  hard to put on deodorant, dard to reach head.  Wife helps pass belt behind    Multiple Pain Sites  --   6/10 neck pain , stiff  worse turning head.                       [facility] Adult PT Treatment/Exercise - 06/01/18 0001      Self-Care   Self-Care  --   shoulder anatomy,  how rotator works     Shoulder Exercises: Supine   Other Supine Exercises  supine dowel press( serratus)  cues technique   both  across shoulders sensitive     Shoulder Exercises: Standing   External Rotation Limitations  R only x 20 yellow   rolled towel   Internal Rotation Limitations  R only x 20 yellow   with rolled towel   Other Standing Exercises  right wall slide  20 X   right biceo curl yellow band X20   Other Standing Exercises  scapular retraction      Shoulder Exercises: Pulleys   Flexion  3 minutes      Shoulder Exercises: Isometric Strengthening   Other Isometric Exercises  Lt only 5 sec x 10 flexion, IR, ER, extension             PT Education - 06/01/18 1203    Education provided  Yes    Education Details  shoulder anatomy,  how things work to move shoulder,     Person(s) Educated  Patient    Methods  Explanation;Demonstration    Comprehension  Verbalized understanding       PT Short Term Goals - 06/01/18 1206      PT SHORT TERM GOAL #1   Title  Patient to demonstrate R shoulder PROM as being full on all measured planes in order to show improved shoulder mobility     Time  4    Period  Weeks    Status  Unable to assess      PT SHORT TERM GOAL #2   Title  Patient to demonstrate R shoulder AROM as being full on all measured planes in order to show improved shoulder mobility and tolerance to activity     Baseline  AROM not full,  limited by pain    Time  4    Period  Weeks    Status  On-going      PT SHORT TERM GOAL #3   Baseline  he has no idea % of time he uses correct posture.  Able to demo in clinic.    Time  4    Period  Weeks    Status  On-going      PT SHORT TERM GOAL #4   Title  Patient to be compliant with appropriate HEP, to be updated PRN     Time  1    Period  Weeks    Status  Achieved        PT Long Term Goals - 05/17/18 1405      PT LONG TERM GOAL #1   Title  Patient to demonstrate R shoulder strength as being at least 4/5 in all tested groups in order to show improving  functional strength and to reduce pain     Baseline  7/30- roughly 2+/5 to 3-/5 pain limited by gross assessment     Status  On-going    Target Date  04/13/18      PT LONG TERM GOAL #2   Title  Patient to be able to reach overhead with R shoulder without scapular winging or upper trap compensation pattern in order to show improved shoulder mechanics     Baseline  7/30- DNT     Status  On-going      PT LONG TERM GOAL #3   Title  Patient to report pain as being no more than 2/10 R shoulder in order to improve QOL and functional task tolerance     Baseline  7/30- 4-5/10     Status  On-going      PT LONG TERM GOAL #4   Title  Patient to be able to perform all self care tasks with R shoulder independently with no increase in pain in order to show improved self-efficacy     Baseline  7/30- ongoing     Status  On-going      PT LONG TERM GOAL #5   Title  Lt shoulder pain <=2/10 with ADLs    Baseline  severe pain and limited in use for ADLs on 8/27    Time  8    Period  Weeks    Status  New    Target Date  07/15/18      Additional Long Term Goals   Additional Long Term Goals  Yes      PT LONG TERM GOAL #6   Title  Pt will demo proper biomechanical chain movement in overhead reaching and movements     Baseline  unable at ERO    Time  8  Period  Weeks    Status  New    Target Date  07/15/18      PT LONG TERM GOAL #7   Title  Pt will begin training appropriate return to work activities pain <=3/10    Baseline  unable at ERO    Time  8    Period  Weeks    Status  New    Target Date  07/15/18            Plan - 06/01/18 1204    Clinical Impression Statement  Patient was able to decrease pain to 5/10 at end of session with exercise  and posture ed.  He declined the need for modalities at end of session.  Right shoulder flexion on wall 140 degrees.        Patient will benefit from skilled therapeutic intervention in order to improve the following deficits and impairments:      Visit Diagnosis: Stiffness of right shoulder, not elsewhere classified  Acute pain of right shoulder  Muscle weakness (generalized)  Abnormal posture  Stiffness of left shoulder, not elsewhere classified  Acute pain of left shoulder     Problem List Patient Active Problem List   Diagnosis Date Noted  . Diarrhea 05/31/2018  . 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 Smoker 06/07/2014  . GERD (gastroesophageal reflux disease) 06/07/2014  . Hemorrhoid 05/27/2014    Ericah Scotto PTA 06/01/2018, 12:09 PM  [facility] [address] [place], [place], [address] Phone: [phone]   Fax:  [phone]  Name: Joshua Stephenson MRN: [id number] Date of Birth: 25-Dec-1955

## 2018-06-06 ENCOUNTER — Telehealth: Payer: Self-pay | Admitting: Physical Therapy

## 2018-06-06 ENCOUNTER — Encounter: Payer: Self-pay | Admitting: Physical Therapy

## 2018-06-06 ENCOUNTER — Ambulatory Visit: Payer: Commercial Managed Care - PPO | Admitting: Physical Therapy

## 2018-06-06 ENCOUNTER — Ambulatory Visit (AMBULATORY_SURGERY_CENTER): Payer: Self-pay

## 2018-06-06 VITALS — Ht 67.0 in | Wt 162.0 lb

## 2018-06-06 DIAGNOSIS — M25512 Pain in left shoulder: Secondary | ICD-10-CM

## 2018-06-06 DIAGNOSIS — M25611 Stiffness of right shoulder, not elsewhere classified: Secondary | ICD-10-CM

## 2018-06-06 DIAGNOSIS — R293 Abnormal posture: Secondary | ICD-10-CM

## 2018-06-06 DIAGNOSIS — M25511 Pain in right shoulder: Secondary | ICD-10-CM

## 2018-06-06 DIAGNOSIS — M6281 Muscle weakness (generalized): Secondary | ICD-10-CM

## 2018-06-06 DIAGNOSIS — R197 Diarrhea, unspecified: Secondary | ICD-10-CM

## 2018-06-06 DIAGNOSIS — M25612 Stiffness of left shoulder, not elsewhere classified: Secondary | ICD-10-CM

## 2018-06-06 NOTE — Telephone Encounter (Signed)
Patient called to inquire about records release. Reports he filled out from with front desk last week. Took info from patient and promised to call back this PM.  Checked with Medical Records - will process today.  Called patient back to notify - had to leave VM stating the records request would be processed today.

## 2018-06-06 NOTE — Therapy (Signed)
[place], [place], [address] Phone: [phone]   Fax:  [phone]  Physical Therapy Treatment  Patient Details  Name: Joshua Stephenson MRN: [id number] Date of Birth: Mar 04, 1956 Referring Provider: Dorothyann Peng   Encounter Date: 06/06/2018  PT End of Session - 06/06/18 1032    Visit Number  26    Number of Visits  38    Date for PT Re-Evaluation  07/15/18    Authorization Type  [employer] PPO     Authorization Time Period  1/00/71 to 11/09/73; recert done 8/83/25; recert done 4/98/2641    PT Start Time  1015    PT Stop Time  1058    PT Time Calculation (min)  43 min    Activity Tolerance  Patient tolerated treatment well    Behavior During Therapy  [facility] for tasks assessed/performed       Past Medical History:  Diagnosis Date  . Anxiety   . Asthma    as a child  . 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 Depression   . GERD (gastroesophageal reflux disease)   . Hemorrhoids     Past Surgical History:  Procedure Laterality Date  . COLONOSCOPY  2011  . SHOULDER ARTHROSCOPY W/ ROTATOR CUFF REPAIR Right 02/08/2018    There were no vitals filed for this visit.  Subjective Assessment - 06/06/18 1023    Subjective  Paytient reports the past few mornings have been hard for him. He feels severe pain running down the backof bith of his arms.                        [facility] PT Treatment/Exercise - 06/06/18 0001      Shoulder Exercises: Supine   Other Supine Exercises  Supine shoulder flexion with dowel, supier ER/IR 1lb right no weight on the left       Shoulder Exercises: Standing   External Rotation Limitations  R only  x 20 yellow   rolled towel   Internal Rotation Limitations  bilateral  x 20 yellow   with rolled towel   Other Standing Exercises  UE tanger x20    right biceo curl yellow band X20   Other Standing Exercises  scapular retraction yellow x20; shoulder extension x20  yellow       Shoulder Exercises: Pulleys   Flexion  3 minutes      Manual Therapy   Joint Mobilization  Grade II and II PA, AP and iferior glides bilateral     Soft tissue mobilization  Upper traps , levator     Passive ROM  Bilateral Flex, Abdct, IR ER cues for relaxation throughout               PT Short Term Goals - 06/06/18 1648      PT SHORT TERM GOAL #1   Title  Patient to demonstrate R shoulder PROM as being full on all measured planes in order to show improved shoulder mobility     Baseline  05/24/2018-see objective measures, progressing     Time  4    Period  Weeks    Status  On-going      PT SHORT TERM GOAL #2   Title  Patient to demonstrate R shoulder AROM as being full on all measured planes in order to show improved shoulder mobility and tolerance to activity     Baseline  AROM not full,  limited by pain  Time  4    Period  Weeks    Status  On-going      PT SHORT TERM GOAL #3   Title  Patient to be able to maintain correct functional posture at least 75% of the time in order to assist in maintaining correct shoulder mechanics and reducing pain     Baseline  he has no idea % of time he uses correct posture.  Able to demo in clinic.    Time  4    Period  Weeks    Status  On-going      PT SHORT TERM GOAL #4   Title  Patient to be compliant with appropriate HEP, to be updated PRN     Baseline  05/24/2018 - compliant, wife assist    Time  1    Period  Weeks    Status  Achieved        PT Long Term Goals - 05/17/18 1405      PT LONG TERM GOAL #1   Title  Patient to demonstrate R shoulder strength as being at least 4/5 in all tested groups in order to show improving functional strength and to reduce pain     Baseline  7/30- roughly 2+/5 to 3-/5 pain limited by gross assessment     Status  On-going    Target Date  04/13/18      PT LONG TERM GOAL #2   Title  Patient to be able to reach overhead with R shoulder without scapular winging or upper trap  compensation pattern in order to show improved shoulder mechanics     Baseline  7/30- DNT     Status  On-going      PT LONG TERM GOAL #3   Title  Patient to report pain as being no more than 2/10 R shoulder in order to improve QOL and functional task tolerance     Baseline  7/30- 4-5/10     Status  On-going      PT LONG TERM GOAL #4   Title  Patient to be able to perform all self care tasks with R shoulder independently with no increase in pain in order to show improved self-efficacy     Baseline  7/30- ongoing     Status  On-going      PT LONG TERM GOAL #5   Title  Lt shoulder pain <=2/10 with ADLs    Baseline  severe pain and limited in use for ADLs on 8/27    Time  8    Period  Weeks    Status  New    Target Date  07/15/18      Additional Long Term Goals   Additional Long Term Goals  Yes      PT LONG TERM GOAL #6   Title  Pt will demo proper biomechanical chain movement in overhead reaching and movements     Baseline  unable at ERO    Time  8    Period  Weeks    Status  New    Target Date  07/15/18      PT LONG TERM GOAL #7   Title  Pt will begin training appropriate return to work activities pain <=3/10    Baseline  unable at ERO    Time  8    Period  Weeks    Status  New    Target Date  07/15/18  Plan - 06/06/18 1618    Clinical Impression Statement  Patient hasd a minor increase in pain with treatment. He was given a yellow band forhome. Therapy added in active RTC strengthening exercises. Therapy also added in the UE ranger. He continues to have significant spasming in his upper traps.     Clinical Presentation  Evolving    Clinical Decision Making  Low    PT Frequency  2x / week    PT Duration  8 weeks    PT Treatment/Interventions  ADLs/Self Care Home Management;Biofeedback;Cryotherapy;Electrical Stimulation;Iontophoresis 4mg /ml Dexamethasone;Moist Heat;Traction;Ultrasound;Functional mobility training;Therapeutic activities;Therapeutic  exercise;Balance training;Neuromuscular re-education;Patient/family education;Manual techniques;Scar mobilization;Passive range of motion;Dry needling;Energy conservation;Taping    PT Next Visit Plan  assess response to IFC -giive TENS info, STM bil upper traps, pain control bilat, consider ktape, progress to Left shoulde bands per protocol; PROM- see protocol      update LTGs for Rt shoulder    PT Home Exercise Plan  Eval: grip squeezes, wrist flexion/extension, elbow flexion/extension, horizontal and vertical PROM pendulums standing , yellow band Rt shoulder scap retract and extension; left shoulder isometrics, right shoulder IR/ER with yellow band        Patient will benefit from skilled therapeutic intervention in order to improve the following deficits and impairments:  Decreased skin integrity, Increased fascial restricitons, Pain, Decreased mobility, Decreased scar mobility, Increased muscle spasms, Postural dysfunction, Decreased activity tolerance, Decreased range of motion, Decreased strength, Hypomobility, Impaired perceived functional ability, Impaired UE functional use, Decreased safety awareness, Impaired flexibility  Visit Diagnosis: Stiffness of right shoulder, not elsewhere classified  Acute pain of right shoulder  Muscle weakness (generalized)  Abnormal posture  Stiffness of left shoulder, not elsewhere classified  Acute pain of left shoulder     Problem List Patient Active Problem List   Diagnosis Date Noted  . Diarrhea 05/31/2018  . COPD, mild ([place]) 06/07/2014  . Smoker 06/07/2014  . GERD (gastroesophageal reflux disease) 06/07/2014  . Hemorrhoid 05/27/2014    Carney Living 06/06/2018, 4:54 PM  [facility] [address] [place], [place], [address] Phone: [phone]   Fax:  [phone]  Name: Joshua Stephenson MRN: [id number] Date of Birth: [birth year]

## 2018-06-06 NOTE — Progress Notes (Signed)
Denies allergies to eggs or soy products. Denies complication of anesthesia or sedation. Denies use of weight loss medication. Denies use of O2.   Emmi instructions declined.  

## 2018-06-07 ENCOUNTER — Encounter: Payer: Self-pay | Admitting: Physical Therapy

## 2018-06-07 ENCOUNTER — Ambulatory Visit: Payer: Commercial Managed Care - PPO | Admitting: Physical Therapy

## 2018-06-07 DIAGNOSIS — M25611 Stiffness of right shoulder, not elsewhere classified: Secondary | ICD-10-CM | POA: Diagnosis not present

## 2018-06-07 DIAGNOSIS — M6281 Muscle weakness (generalized): Secondary | ICD-10-CM

## 2018-06-07 DIAGNOSIS — R293 Abnormal posture: Secondary | ICD-10-CM

## 2018-06-07 DIAGNOSIS — M25511 Pain in right shoulder: Secondary | ICD-10-CM

## 2018-06-07 DIAGNOSIS — M25612 Stiffness of left shoulder, not elsewhere classified: Secondary | ICD-10-CM

## 2018-06-07 DIAGNOSIS — M25512 Pain in left shoulder: Secondary | ICD-10-CM

## 2018-06-07 NOTE — Therapy (Signed)
[place], [place], [address] Phone: [phone]   Fax:  [phone]  Physical Therapy Treatment  Patient Details  Name: Joshua Stephenson MRN: [id number] Date of Birth: 09/27/1955 Referring Provider: Dorothyann Peng   Encounter Date: 06/07/2018  PT End of Session - 06/07/18 1023    Visit Number  27    Number of Visits  38    Date for PT Re-Evaluation  07/15/18    Authorization Type  [employer] PPO     Authorization Time Period  12/29/71 to 5/32/99; recert done 2/42/68; recert done 3/41/9622    PT Start Time  1015    PT Stop Time  1059    PT Time Calculation (min)  44 min    Activity Tolerance  Patient tolerated treatment well    Behavior During Therapy  [facility] for tasks assessed/performed       Past Medical History:  Diagnosis Date  . Anxiety   . Asthma    as a child  . COPD, mild ([place]) 06/07/2014  . Depression   . GERD (gastroesophageal reflux disease)   . Hemorrhoids     Past Surgical History:  Procedure Laterality Date  . COLONOSCOPY  2011  . SHOULDER ARTHROSCOPY W/ ROTATOR CUFF REPAIR Right 02/08/2018    There were no vitals filed for this visit.  Subjective Assessment - 06/07/18 1020    Subjective  Patient reports he was sore acrossed his neck after yesterdays visit. The pain is still going down the back of his arms.     Limitations  Other (comment)    How long can you sit comfortably?  6/26- riding in a car, about an hour     How long can you stand comfortably?  6/26- no limits in sling     How long can you walk comfortably?  6/26- no limits in sling     Patient Stated Goals  reduce pain, be able to go back to work     Currently in Pain?  Yes    Pain Score  6     Pain Location  Shoulder    Pain Orientation  Right;Left    Pain Descriptors / Indicators  Aching;Tightness;Sore    Pain Type  Chronic pain    Pain Onset  More than a month ago    Pain Frequency  Constant    Aggravating  Factors   reaching, sleeping     Pain Relieving Factors  heatr and rest     Effect of Pain on Daily Activities  difficulty with all ADL's                        [facility] Adult PT Treatment/Exercise - 06/07/18 0001      Shoulder Exercises: Supine   Other Supine Exercises  Supine shoulder flexion with dowel, supier ER/IR 1lb right no weight on the left       Shoulder Exercises: Standing   External Rotation Limitations  R only  x 20 yellow   rolled towel   Other Standing Exercises  towel vs wall x10     Other Standing Exercises  scapular retraction yellow x20; shoulder extension x20 yellow;      Shoulder Exercises: Pulleys   Flexion  3 minutes      Manual Therapy   Joint Mobilization  Grade II and II PA, AP and iferior glides bilateral; difficulty with mobilizations 2nd to sifgnificant guarding with light touch.  Soft tissue mobilization  Upper traps , levator     Passive ROM  Bilateral Flex, Abdct, IR ER cues for relaxation throughout             PT Education - 06/07/18 1022    Education provided  Yes    Education Details  reviewed the improtance of stretching     Person(s) Educated  Patient    Methods  Explanation;Demonstration;Tactile cues;Handout;Verbal cues    Comprehension  Verbalized understanding;Returned demonstration;Verbal cues required;Tactile cues required;Need further instruction       PT Short Term Goals - 06/06/18 1648      PT SHORT TERM GOAL #1   Title  Patient to demonstrate R shoulder PROM as being full on all measured planes in order to show improved shoulder mobility     Baseline  05/24/2018-see objective measures, progressing     Time  4    Period  Weeks    Status  On-going      PT SHORT TERM GOAL #2   Title  Patient to demonstrate R shoulder AROM as being full on all measured planes in order to show improved shoulder mobility and tolerance to activity     Baseline  AROM not full,  limited by pain    Time  4    Period  Weeks     Status  On-going      PT SHORT TERM GOAL #3   Title  Patient to be able to maintain correct functional posture at least 75% of the time in order to assist in maintaining correct shoulder mechanics and reducing pain     Baseline  he has no idea % of time he uses correct posture.  Able to demo in clinic.    Time  4    Period  Weeks    Status  On-going      PT SHORT TERM GOAL #4   Title  Patient to be compliant with appropriate HEP, to be updated PRN     Baseline  05/24/2018 - compliant, wife assist    Time  1    Period  Weeks    Status  Achieved        PT Long Term Goals - 05/17/18 1405      PT LONG TERM GOAL #1   Title  Patient to demonstrate R shoulder strength as being at least 4/5 in all tested groups in order to show improving functional strength and to reduce pain     Baseline  7/30- roughly 2+/5 to 3-/5 pain limited by gross assessment     Status  On-going    Target Date  04/13/18      PT LONG TERM GOAL #2   Title  Patient to be able to reach overhead with R shoulder without scapular winging or upper trap compensation pattern in order to show improved shoulder mechanics     Baseline  7/30- DNT     Status  On-going      PT LONG TERM GOAL #3   Title  Patient to report pain as being no more than 2/10 R shoulder in order to improve QOL and functional task tolerance     Baseline  7/30- 4-5/10     Status  On-going      PT LONG TERM GOAL #4   Title  Patient to be able to perform all self care tasks with R shoulder independently with no increase in pain in order to show improved self-efficacy  Baseline  7/30- ongoing     Status  On-going      PT LONG TERM GOAL #5   Title  Lt shoulder pain <=2/10 with ADLs    Baseline  severe pain and limited in use for ADLs on 8/27    Time  8    Period  Weeks    Status  New    Target Date  07/15/18      Additional Long Term Goals   Additional Long Term Goals  Yes      PT LONG TERM GOAL #6   Title  Pt will demo proper biomechanical  chain movement in overhead reaching and movements     Baseline  unable at ERO    Time  8    Period  Weeks    Status  New    Target Date  07/15/18      PT LONG TERM GOAL #7   Title  Pt will begin training appropriate return to work activities pain <=3/10    Baseline  unable at ERO    Time  8    Period  Weeks    Status  New    Target Date  07/15/18            Plan - 06/07/18 1033    Clinical Impression Statement  Patient continues to have significant tenderness to light touch of his shoulders. It makes it difficult to perfrom even light joint mobilizations and passive range. His range does appear to be improving though. He requires mod to max cuing to relax with PROM. He was given and HEP for stretching and scapualr strengthening.     Clinical Presentation  Evolving    Clinical Decision Making  Moderate    Rehab Potential  Good    PT Frequency  2x / week    PT Duration  8 weeks    PT Treatment/Interventions  ADLs/Self Care Home Management;Biofeedback;Cryotherapy;Electrical Stimulation;Iontophoresis 4mg /ml Dexamethasone;Moist Heat;Traction;Ultrasound;Functional mobility training;Therapeutic activities;Therapeutic exercise;Balance training;Neuromuscular re-education;Patient/family education;Manual techniques;Scar mobilization;Passive range of motion;Dry needling;Energy conservation;Taping    PT Next Visit Plan  continue with stretching and strengthening exercises as tolerated.     PT Home Exercise Plan  Eval: grip squeezes, wrist flexion/extension, elbow flexion/extension, horizontal and vertical PROM pendulums standing , yellow band Rt shoulder scap retract and extension; left shoulder isometrics, right shoulder IR/ER with yellow band     Consulted and Agree with Plan of Care  Patient       Patient will benefit from skilled therapeutic intervention in order to improve the following deficits and impairments:  Decreased skin integrity, Increased fascial restricitons, Pain, Decreased  mobility, Decreased scar mobility, Increased muscle spasms, Postural dysfunction, Decreased activity tolerance, Decreased range of motion, Decreased strength, Hypomobility, Impaired perceived functional ability, Impaired UE functional use, Decreased safety awareness, Impaired flexibility  Visit Diagnosis: Stiffness of right shoulder, not elsewhere classified  Acute pain of right shoulder  Muscle weakness (generalized)  Abnormal posture  Stiffness of left shoulder, not elsewhere classified  Acute pain of left shoulder     Problem List Patient Active Problem List   Diagnosis Date Noted  . Diarrhea 05/31/2018  . 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 Smoker 06/07/2014  . GERD (gastroesophageal reflux disease) 06/07/2014  . Hemorrhoid 05/27/2014    Carney Living PT DPT  06/07/2018, 1:48 PM  [facility] [address] [place], [place], [address] Phone: [phone]   Fax:  [phone]  Name: Joshua Stephenson MRN: [id number]  Date of Birth: 18-Feb-1956

## 2018-06-08 ENCOUNTER — Ambulatory Visit (INDEPENDENT_AMBULATORY_CARE_PROVIDER_SITE_OTHER): Payer: Commercial Managed Care - PPO | Admitting: Orthopaedic Surgery

## 2018-06-08 ENCOUNTER — Encounter (INDEPENDENT_AMBULATORY_CARE_PROVIDER_SITE_OTHER): Payer: Self-pay | Admitting: Orthopaedic Surgery

## 2018-06-08 ENCOUNTER — Ambulatory Visit: Payer: Commercial Managed Care - PPO | Admitting: Physical Therapy

## 2018-06-08 ENCOUNTER — Encounter: Payer: Self-pay | Admitting: Physical Therapy

## 2018-06-08 DIAGNOSIS — M25512 Pain in left shoulder: Secondary | ICD-10-CM

## 2018-06-08 DIAGNOSIS — M25511 Pain in right shoulder: Secondary | ICD-10-CM

## 2018-06-08 DIAGNOSIS — M5412 Radiculopathy, cervical region: Secondary | ICD-10-CM | POA: Diagnosis not present

## 2018-06-08 DIAGNOSIS — M25612 Stiffness of left shoulder, not elsewhere classified: Secondary | ICD-10-CM

## 2018-06-08 DIAGNOSIS — M25611 Stiffness of right shoulder, not elsewhere classified: Secondary | ICD-10-CM | POA: Diagnosis not present

## 2018-06-08 DIAGNOSIS — M542 Cervicalgia: Secondary | ICD-10-CM

## 2018-06-08 DIAGNOSIS — R293 Abnormal posture: Secondary | ICD-10-CM

## 2018-06-08 DIAGNOSIS — M6281 Muscle weakness (generalized): Secondary | ICD-10-CM

## 2018-06-08 MED ORDER — ACETAMINOPHEN-CODEINE #3 300-30 MG PO TABS: 1.0000 | ORAL_TABLET | Freq: Three times a day (TID) | ORAL | 0 refills | Status: DC | PRN

## 2018-06-08 NOTE — Progress Notes (Signed)
Office Visit Note   Patient: Joshua Stephenson           Date of Birth: 09-19-1956           MRN: [id number] Visit Date: 06/08/2018              Requested by: Dorothyann Peng, NP [place], [place] [address] PCP: Dorothyann Peng, NP   Assessment & Plan: Visit Diagnoses:  1. Cervical radiculopathy   2. Neck pain     Plan: Impression is cervical spine radiculopathy.  At this point, we will refer the patient to Dr. Ernestina Patches for an epidural steroid injection.  Also provided the patient with an updated prescription to add to his current physical therapy regimen to include cervical traction, modalities and McKenzie exercises.  He will follow-up with [place] as needed.  Follow-Up Instructions: Return if symptoms worsen or fail to improve.   Orders:  Orders Placed This Encounter  Procedures  . Ambulatory referral to Physical Medicine Rehab   Meds ordered this encounter  Medications  . acetaminophen-codeine (TYLENOL #3) 300-30 MG tablet    Sig: Take 1 tablet by mouth every 8 (eight) hours as needed for moderate pain.    Dispense:  30 tablet    Refill:  0      Procedures: No procedures performed   Clinical Data: No additional findings.   Subjective: Chief Complaint  Patient presents with  . Neck - Pain, Follow-up    HPI patient is a pleasant 62 year old gentleman who presents to our clinic today to discuss MRI results of the cervical spine.  He has status post right shoulder rotator cuff repair in May 2019 with subsequent left shoulder rotator cuff repair and proximal biceps tenodesis in July 2019.  Both surgeries in [place].  He has had no relief since.  He was recently seen in our office with right upper extremity pain, numbness and tingling.  MRI of the cervical spine ordered which showed severe bilateral foraminal narrowing C3-4 worse on the right there as well as a left-sided disc protrusion at C4-5, moderate to severe bilateral foraminal narrowing at  C5-6 as well as C6-7 and C7-T1.  He has not previously had an epidural steroid injections.  Review of Systems as detailed in HPI.  All others reviewed and are negative.   Objective: Vital Signs: There were no vitals taken for this visit.  Physical Exam well-developed well-nourished gentleman in no acute distress.  Alert and oriented x3.  Ortho Exam stable cervical spine exam.  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  Limited range of motion secondary to pain  Specialty Comments:  No specialty comments available.  Imaging:  No new imaging   PMFS History: Patient Active Problem List   Diagnosis Date Noted  . Cervical radiculopathy 06/08/2018  . Diarrhea 05/31/2018  . 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 Smoker 06/07/2014  . GERD (gastroesophageal reflux disease) 06/07/2014  . Hemorrhoid 05/27/2014   Past Medical History:  Diagnosis Date  . Anxiety   . Asthma    as a child  . COPD, mild ([place]) 06/07/2014  . Depression   . GERD (gastroesophageal reflux disease)   . Hemorrhoids     Family History  Problem Relation Age of Onset  . Diabetes Father   . Heart disease Father   . Hypertension Father   . Breast cancer Mother   . Hypertension Mother   . Cancer Sister  type unknown  . Cancer Brother        oral cancer   . Breast cancer Other        niece  . Cancer Brother        oral cancer   . Colon cancer Neg Hx   . Esophageal cancer Neg Hx   . Rectal cancer Neg Hx   . Stomach cancer Neg Hx     Past Surgical History:  Procedure Laterality Date  . COLONOSCOPY  2011  . SHOULDER ARTHROSCOPY W/ ROTATOR CUFF REPAIR Right 02/08/2018   Social History   Occupational History  . Occupation: Psychologist, occupational: [employer]  Tobacco Use  . Smoking status: Former Smoker    Packs/day: 1.00    Years: 20.00    Pack years: 20.00    Types: Cigarettes    Last attempt to quit: 12/2017    Years since quitting: 0.4  . Smokeless tobacco: Never Used    Substance and Sexual Activity  . Alcohol use: Not Currently  . Drug use: No  . Sexual activity: Yes    Birth control/protection: None

## 2018-06-08 NOTE — Patient Instructions (Signed)
Issued from HEP drawer Hand and wrist AROM issued Daily 10 x each 1 second hold.

## 2018-06-08 NOTE — Therapy (Signed)
[place], [place], [address] Phone: [phone]   Fax:  [phone]  Physical Therapy Treatment  Patient Details  Name: Joshua Stephenson MRN: [id number] Date of Birth: 11-May-1956 Referring Provider: Dorothyann Peng   Encounter Date: 06/08/2018  PT End of Session - 06/08/18 1308    Visit Number  28    Number of Visits  38    Date for PT Re-Evaluation  07/15/18    PT Start Time  7026    PT Stop Time  1233    PT Time Calculation (min)  48 min    Activity Tolerance  Patient tolerated treatment well    Behavior During Therapy  [facility] for tasks assessed/performed       Past Medical History:  Diagnosis Date  . Anxiety   . Asthma    as a child  . 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 Depression   . GERD (gastroesophageal reflux disease)   . Hemorrhoids     Past Surgical History:  Procedure Laterality Date  . COLONOSCOPY  2011  . SHOULDER ARTHROSCOPY W/ ROTATOR CUFF REPAIR Right 02/08/2018    There were no vitals filed for this visit.  Subjective Assessment - 06/08/18 1154    Subjective  Patient is in pain.  It may be from neck.  He had MRI on neck and has nor gotten results yet.      Currently in Pain?  Yes    Pain Score  6     Pain Location  Shoulder    Pain Orientation  Right;Left    Pain Descriptors / Indicators  --   Hurts,  pAINFUL,  DISCOMFORT   Pain Radiating Towards  WHOLE SHOULDER ACROSS UPPER BACK DOWN ARMS    Pain Frequency  Constant    Aggravating Factors   REACHING ACROSS BODY,  DRESSING    Pain Relieving Factors  HEAT  REST    Effect of Pain on Daily Activities  NEEDS HELP TO FASTEN BELT IF HE HAS BEEN USING HANDS A LOT         [facility] PT Assessment - 06/08/18 0001      Strength   Right Hand Grip (lbs)  8.66   pain 4/10  average strenghth   Left Hand Grip (lbs)  6.5   pain  5/10,  average strength                  [facility] Adult PT Treatment/Exercise - 06/08/18 0001       Shoulder Exercises: Seated   Other Seated Exercises  scapular retraction AROM    Other Seated Exercises  triceps right 10 X yellow,  left unable      Shoulder Exercises: Pulleys   Flexion  3 minutes      Hand Exercises   Other Hand Exercises  Handout issued for hand exercises,  AROM finger extension , opposition to thumb  abduction,fist ti finger spread,thunm abduction.    Other Hand Exercises  5 LB gripper 10 x  to 15 x each,  putty issued for home. yellow      Wrist Exercises   Wrist Flexion  Both;AROM    Wrist Extension  AROM;Both;10 reps    Wrist Radial Deviation  AROM;Both;10 reps             PT Education - 06/08/18 1307    Education provided  Yes    Education Details  HEP    Person(s) Educated  Patient  Methods  Explanation;Demonstration;Tactile cues;Verbal cues;Handout    Comprehension  Verbalized understanding;Returned demonstration       PT Short Term Goals - 06/06/18 1648      PT SHORT TERM GOAL #1   Title  Patient to demonstrate R shoulder PROM as being full on all measured planes in order to show improved shoulder mobility     Baseline  05/24/2018-see objective measures, progressing     Time  4    Period  Weeks    Status  On-going      PT SHORT TERM GOAL #2   Title  Patient to demonstrate R shoulder AROM as being full on all measured planes in order to show improved shoulder mobility and tolerance to activity     Baseline  AROM not full,  limited by pain    Time  4    Period  Weeks    Status  On-going      PT SHORT TERM GOAL #3   Title  Patient to be able to maintain correct functional posture at least 75% of the time in order to assist in maintaining correct shoulder mechanics and reducing pain     Baseline  he has no idea % of time he uses correct posture.  Able to demo in clinic.    Time  4    Period  Weeks    Status  On-going      PT SHORT TERM GOAL #4   Title  Patient to be compliant with appropriate HEP, to be updated PRN     Baseline   05/24/2018 - compliant, wife assist    Time  1    Period  Weeks    Status  Achieved        PT Long Term Goals - 05/17/18 1405      PT LONG TERM GOAL #1   Title  Patient to demonstrate R shoulder strength as being at least 4/5 in all tested groups in order to show improving functional strength and to reduce pain     Baseline  7/30- roughly 2+/5 to 3-/5 pain limited by gross assessment     Status  On-going    Target Date  04/13/18      PT LONG TERM GOAL #2   Title  Patient to be able to reach overhead with R shoulder without scapular winging or upper trap compensation pattern in order to show improved shoulder mechanics     Baseline  7/30- DNT     Status  On-going      PT LONG TERM GOAL #3   Title  Patient to report pain as being no more than 2/10 R shoulder in order to improve QOL and functional task tolerance     Baseline  7/30- 4-5/10     Status  On-going      PT LONG TERM GOAL #4   Title  Patient to be able to perform all self care tasks with R shoulder independently with no increase in pain in order to show improved self-efficacy     Baseline  7/30- ongoing     Status  On-going      PT LONG TERM GOAL #5   Title  Lt shoulder pain <=2/10 with ADLs    Baseline  severe pain and limited in use for ADLs on 8/27    Time  8    Period  Weeks    Status  New    Target Date  07/15/18  Additional Long Term Goals   Additional Long Term Goals  Yes      PT LONG TERM GOAL #6   Title  Pt will demo proper biomechanical chain movement in overhead reaching and movements     Baseline  unable at ERO    Time  8    Period  Weeks    Status  New    Target Date  07/15/18      PT LONG TERM GOAL #7   Title  Pt will begin training appropriate return to work activities pain <=3/10    Baseline  unable at ERO    Time  8    Period  Weeks    Status  New    Target Date  07/15/18            Plan - 06/08/18 1308    Clinical Impression Statement  Patient was able to progress HEP to  include hand and wrist.  Grip average right 8.66 LBS and left 6.5 LBS.  He sees MD today for follow up on MRI.  Pain continues in shoulders with exercise.     PT Home Exercise Plan  Eval: grip squeezes, wrist flexion/extension, elbow flexion/extension, horizontal and vertical PROM pendulums standing , yellow band Rt shoulder scap retract and extension; left shoulder isometrics, right shoulder IR/ER with yellow band .  Hand and wrist AROM.  putty    Consulted and Agree with Plan of Care  Patient       Patient will benefit from skilled therapeutic intervention in order to improve the following deficits and impairments:     Visit Diagnosis: Stiffness of right shoulder, not elsewhere classified  Acute pain of right shoulder  Muscle weakness (generalized)  Abnormal posture  Stiffness of left shoulder, not elsewhere classified  Acute pain of left shoulder     Problem List Patient Active Problem List   Diagnosis Date Noted  . Diarrhea 05/31/2018  . 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 Smoker 06/07/2014  . GERD (gastroesophageal reflux disease) 06/07/2014  . Hemorrhoid 05/27/2014    Joshua Stephenson PTA 06/08/2018, 1:18 PM  [facility] [address] [place], [place], [address] Phone: [phone]   Fax:  [phone]  Name: Joshua Stephenson MRN: [id number] Date of Birth: 25-Jan-1956

## 2018-06-09 ENCOUNTER — Encounter: Payer: Self-pay | Admitting: Internal Medicine

## 2018-06-13 ENCOUNTER — Ambulatory Visit: Payer: Commercial Managed Care - PPO | Admitting: Physical Therapy

## 2018-06-13 ENCOUNTER — Encounter: Payer: Self-pay | Admitting: Physical Therapy

## 2018-06-13 DIAGNOSIS — M25512 Pain in left shoulder: Secondary | ICD-10-CM

## 2018-06-13 DIAGNOSIS — M25611 Stiffness of right shoulder, not elsewhere classified: Secondary | ICD-10-CM

## 2018-06-13 DIAGNOSIS — M25511 Pain in right shoulder: Secondary | ICD-10-CM

## 2018-06-13 DIAGNOSIS — R293 Abnormal posture: Secondary | ICD-10-CM

## 2018-06-13 DIAGNOSIS — M6281 Muscle weakness (generalized): Secondary | ICD-10-CM

## 2018-06-13 DIAGNOSIS — M62838 Other muscle spasm: Secondary | ICD-10-CM

## 2018-06-13 DIAGNOSIS — M542 Cervicalgia: Secondary | ICD-10-CM

## 2018-06-13 DIAGNOSIS — M25612 Stiffness of left shoulder, not elsewhere classified: Secondary | ICD-10-CM

## 2018-06-14 ENCOUNTER — Encounter: Payer: Self-pay | Admitting: Physical Therapy

## 2018-06-14 NOTE — Therapy (Signed)
[place], [place], [address] Phone: [phone]   Fax:  [phone]  Physical Therapy Re-eval   Patient Details  Name: Joshua Stephenson MRN: [id number] Date of Birth: 1955/12/17 Referring Provider: Dorothyann Peng   Encounter Date: 06/13/2018  PT End of Session - 06/13/18 1026    Visit Number  29    Number of Visits  41    Date for PT Re-Evaluation  07/26/18    Authorization Type  [employer] P[employer] 1st vsit for neck 06/13/2018     Authorization Time Period  2/50/53 to 9/76/73; recert done 01/08/36; recert done 05/23/4096    PT Start Time  1016    PT Stop Time  1057    PT Time Calculation (min)  41 min    Activity Tolerance  Treatment limited secondary to medical complications (Comment)    Behavior During Therapy  [facility] for tasks assessed/performed       Past Medical History:  Diagnosis Date  . Anxiety   . Asthma    as a child  . 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 Depression   . GERD (gastroesophageal reflux disease)   . Hemorrhoids     Past Surgical History:  Procedure Laterality Date  . COLONOSCOPY  2011  . SHOULDER ARTHROSCOPY W/ ROTATOR CUFF REPAIR Right 02/08/2018    There were no vitals filed for this visit.  Subjective Assessment - 06/13/18 1021    Subjective  Patient continues to report pain radiating into the back of his shoulders. He also feels like someone is stabbing him in his deltoid area on both sides. He has numbness into his hands and feels like his grip strength is decreasing.     Limitations  Other (comment)    How long can you sit comfortably?  6/26- riding in a car, about an hour     How long can you stand comfortably?  6/26- no limits in sling     How long can you walk comfortably?  6/26- no limits in sling     Patient Stated Goals  reduce pain, be able to go back to work     Currently in Pain?  Yes    Pain Score  6     Pain Location  Neck    Pain Orientation  Right    Pain  Descriptors / Indicators  Aching    Pain Type  Chronic pain    Pain Onset  More than a month ago    Pain Frequency  Constant    Aggravating Factors   reaching across the body     Pain Relieving Factors  heat, rest     Effect of Pain on Daily Activities  difficulty using his right arm     Multiple Pain Sites  Yes    Pain Score  6    Pain Location  Shoulder    Pain Orientation  Left    Pain Descriptors / Indicators  Aching    Pain Type  Chronic pain    Pain Onset  More than a month ago    Pain Frequency  Constant    Aggravating Factors   use of his shoulder    Pain Relieving Factors  rest     Effect of Pain on Daily Activities  difficulty using his arm for activity          [facility] PT Assessment - 06/14/18 0001      AROM   Right Shoulder Flexion  110 Degrees    Right Shoulder External Rotation  --   has full motion but very painful    Left Shoulder Flexion  91 Degrees    Left Shoulder External Rotation  --   has full motion but very painful    Cervical Flexion  20    Cervical Extension  18    Cervical - Right Rotation  34    Cervical - Left Rotation  50      Strength   Right Shoulder Flexion  3+/5    Right Shoulder Internal Rotation  3+/5    Right Shoulder External Rotation  3+/5    Left Shoulder Internal Rotation  4+/5    Left Shoulder External Rotation  4+/5    Right Hand Grip (lbs)  9   pain 4/10  average strenghth   Left Hand Grip (lbs)  7   pain  5/10,  average strength     Palpation   Palpation comment  significant spasming and tenderness to palpation. Has low tolerance to light touch.                    [facility] Adult PT Treatment/Exercise - 06/14/18 0001      Self-Care   Self-Care  Other Self-Care Comments    Other Self-Care Comments   reviewed relaxation techniques.Patient becomes tense before soft tisasue mobilizationor passive ROM even starts in anticipation of pain. Therapy talked to him about releaxing and taking deep breaths. Improvedtolerance  to therapy after education.       Manual Therapy   Manual Therapy  Manual Traction    Soft tissue mobilization  Upper traps , levator     Manual Traction  manual traction to cervical spine gentle. Sub-occipital release             PT Education - 06/13/18 1025    Education provided  Yes    Education Details  reviewed exercises for neck and shoulders     Person(s) Educated  Patient    Methods  Explanation;Demonstration;Tactile cues;Verbal cues    Comprehension  Verbalized understanding;Returned demonstration;Verbal cues required;Tactile cues required       PT Short Term Goals - 06/13/18 1154      PT SHORT TERM GOAL #1   Title  Patient to demonstrate R shoulder PROM as being full on all measured planes in order to show improved shoulder mobility     Baseline  05/24/2018-see objective measures, progressing     Time  4    Period  Weeks    Status  On-going      PT SHORT TERM GOAL #2   Title  Patient to demonstrate R shoulder AROM as being full on all measured planes in order to show improved shoulder mobility and tolerance to activity     Baseline  AROM not full,  limited by pain    Time  4    Period  Weeks    Status  On-going      PT SHORT TERM GOAL #3   Title  Patient to be able to maintain correct functional posture at least 75% of the time in order to assist in maintaining correct shoulder mechanics and reducing pain     Baseline  he has no idea % of time he uses correct posture.  Able to demo in clinic.    Time  4    Period  Weeks    Status  New      PT SHORT TERM  GOAL #4   Title  Patient to be compliant with appropriate HEP, to be updated PRN     Baseline  05/24/2018 - compliant, wife assist    Time  1    Period  Weeks    Status  Achieved      PT SHORT TERM GOAL #5   Title  Patient will increase cervical flexion and extension by 10 degrees without pain    Baseline       Time  4    Period  Weeks    Status  New    Target Date  07/11/18      Additional Short  Term Goals   Additional Short Term Goals  Yes      PT SHORT TERM GOAL #6   Title  Patient will increase bilateral cervical rotation by 20 degrees     Time  4    Period  Weeks    Status  New    Target Date  07/11/18        PT Long Term Goals - 06/13/18 1156      PT LONG TERM GOAL #1   Title  Patient to demonstrate R shoulder strength as being at least 4/5 in all tested groups in order to show improving functional strength and to reduce pain     Baseline  7/30- roughly 2+/5 to 3-/5 pain limited by gross assessment     Time  8    Period  Weeks    Status  On-going      PT LONG TERM GOAL #2   Title  Patient to be able to reach overhead with R shoulder without scapular winging or upper trap compensation pattern in order to show improved shoulder mechanics     Baseline  7/30- DNT     Time  8    Period  Weeks    Status  On-going      PT LONG TERM GOAL #3   Title  Patient to report pain as being no more than 2/10 R shoulder in order to improve QOL and functional task tolerance     Baseline  7/30- 4-5/10     Status  On-going      PT LONG TERM GOAL #4   Title  Patient to be able to perform all self care tasks with R shoulder independently with no increase in pain in order to show improved self-efficacy     Baseline  7/30- ongoing     Time  8    Period  Weeks    Status  On-going      PT LONG TERM GOAL #5   Title  Lt shoulder pain <=2/10 with ADLs    Baseline  severe pain and limited in use for ADLs on 8/27    Time  8    Period  Weeks    Status  On-going      PT LONG TERM GOAL #6   Title  Pt will demo proper biomechanical chain movement in overhead reaching and movements     Baseline  unable at ERO    Time  8    Period  Weeks    Status  Achieved      PT LONG TERM GOAL #7   Title  Pt will begin training appropriate return to work activities pain <=3/10 without radicular pain going down his arms.     Baseline  unable at ERO    Time  8    Period  Weeks    Status  Revised             Plan - 06/13/18 1134    Clinical Impression Statement  Patient is a 62 year old male S/P bilateral shoulder RTC repairs with bilateral tenodesis. He has radicualr pain down into his posterior shoulders and her reports a stabbing pain in his biceps. 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 He has limited cervical movement in all planes. He has significant tenderness to palpation into bilateral upper traps and into cervical papraspinals. With palpation of the right upper trap he had reproduction of his radicular symptoms. He would benefit from skilled therapy to continue towork on his shoulder strength and motion and to help reduce cervical symptoms.     Clinical Presentation  Evolving    Clinical Decision Making  Moderate    Rehab Potential  Good    PT Frequency  2x / week    PT Duration  8 weeks    PT Treatment/Interventions  ADLs/Self Care Home Management;Biofeedback;Cryotherapy;Electrical Stimulation;Iontophoresis 4mg /ml Dexamethasone;Moist Heat;Traction;Ultrasound;Functional mobility training;Therapeutic activities;Therapeutic exercise;Balance training;Neuromuscular re-education;Patient/family education;Manual techniques;Scar mobilization;Passive range of motion;Dry needling;Energy conservation;Taping    PT Next Visit Plan  continue with stretching and strengthening exercises as tolerated.     PT Home Exercise Plan  Eval: grip squeezes, wrist flexion/extension, elbow flexion/extension, horizontal and vertical PROM pendulums standing , yellow band Rt shoulder scap retract and extension; left shoulder isometrics, right shoulder IR/ER with yellow band .  Hand and wrist AROM.  putty    Consulted and Agree with Plan of Care  Patient       Patient will benefit from skilled therapeutic intervention in order to improve the following deficits and impairments:  Decreased skin integrity, Increased fascial restricitons, Pain, Decreased mobility, Decreased scar mobility,  Increased muscle spasms, Postural dysfunction, Decreased activity tolerance, Decreased range of motion, Decreased strength, Hypomobility, Impaired perceived functional ability, Impaired UE functional use, Decreased safety awareness, Impaired flexibility  Visit Diagnosis: Cervicalgia  Stiffness of right shoulder, not elsewhere classified  Acute pain of right shoulder  Muscle weakness (generalized)  Abnormal posture  Stiffness of left shoulder, not elsewhere classified  Acute pain of left shoulder  Other muscle spasm     Problem List Patient Active Problem List   Diagnosis Date Noted  . Cervical radiculopathy 06/08/2018  . Diarrhea 05/31/2018  . 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 Smoker 06/07/2014  . GERD (gastroesophageal reflux disease) 06/07/2014  . Hemorrhoid 05/27/2014    Carney Living PT DPT  06/14/2018, 9:45 AM  [facility]: [place] [facility] [address] [place], [place], [address] Phone: [phone]   Fax:  [phone]  Name: Joshua Stephenson MRN: [id number] Date of Birth: Aug 18, 1956

## 2018-06-15 ENCOUNTER — Encounter: Payer: Self-pay | Admitting: Physical Therapy

## 2018-06-15 ENCOUNTER — Telehealth (INDEPENDENT_AMBULATORY_CARE_PROVIDER_SITE_OTHER): Payer: Self-pay | Admitting: *Deleted

## 2018-06-15 ENCOUNTER — Ambulatory Visit: Payer: Commercial Managed Care - PPO | Admitting: Physical Therapy

## 2018-06-15 DIAGNOSIS — M542 Cervicalgia: Secondary | ICD-10-CM

## 2018-06-15 DIAGNOSIS — M25611 Stiffness of right shoulder, not elsewhere classified: Secondary | ICD-10-CM | POA: Diagnosis not present

## 2018-06-15 DIAGNOSIS — M25612 Stiffness of left shoulder, not elsewhere classified: Secondary | ICD-10-CM

## 2018-06-15 DIAGNOSIS — R293 Abnormal posture: Secondary | ICD-10-CM

## 2018-06-15 DIAGNOSIS — M6281 Muscle weakness (generalized): Secondary | ICD-10-CM

## 2018-06-15 DIAGNOSIS — M62838 Other muscle spasm: Secondary | ICD-10-CM

## 2018-06-15 DIAGNOSIS — M25511 Pain in right shoulder: Secondary | ICD-10-CM

## 2018-06-15 DIAGNOSIS — M25512 Pain in left shoulder: Secondary | ICD-10-CM

## 2018-06-15 NOTE — Therapy (Signed)
[place], [place], [address] Phone: [phone]   Fax:  [phone]  Physical Therapy Treatment  Patient Details  Name: Joshua Stephenson MRN: [id number] Date of Birth: 1956-09-05 Referring Provider: Dorothyann Peng   Encounter Date: 06/15/2018  PT End of Session - 06/15/18 1022    Visit Number  30    Number of Visits  41    Date for PT Re-Evaluation  07/26/18    Authorization Type  [employer] PPO 1st vsit for neck 06/13/2018     PT Start Time  1017    PT Stop Time  1105    PT Time Calculation (min)  48 min       Past Medical History:  Diagnosis Date  . Anxiety   . Asthma    as a child  . 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 Depression   . GERD (gastroesophageal reflux disease)   . Hemorrhoids     Past Surgical History:  Procedure Laterality Date  . COLONOSCOPY  2011  . SHOULDER ARTHROSCOPY W/ ROTATOR CUFF REPAIR Right 02/08/2018    There were no vitals filed for this visit.  Subjective Assessment - 06/15/18 1021    Subjective  Felt good after the heat last visit. Sore all over today.     Currently in Pain?  Yes    Pain Score  6     Pain Location  Neck   and bilateral shoulders   Pain Orientation  Posterior;Left;Right    Pain Descriptors / Indicators  Sore                       [facility] Adult PT Treatment/Exercise - 06/15/18 0001      Exercises   Exercises  Neck      Neck Exercises: Supine   Neck Retraction  10 reps    Neck Retraction Limitations  gentle      Shoulder Exercises: Supine   Protraction  5 reps;Right;Left   too painful today   Other Supine Exercises  supine shoulder pressups and pullovers with dowel too painful today       Modalities   Modalities  Traction      Moist Heat Therapy   Number Minutes Moist Heat  15 Minutes    Moist Heat Location  Cervical      Traction   Type of Traction  Cervical    Min (lbs)  5    Max (lbs)  10    Hold Time  60     Rest Time  10    Time  10      Manual Therapy   Soft tissue mobilization  trigger point release to upper traps     Passive ROM  side bend and rotation     Manual Traction  manual traction to c spine                PT Short Term Goals - 06/13/18 1154      PT SHORT TERM GOAL #1   Title  Patient to demonstrate R shoulder PROM as being full on all measured planes in order to show improved shoulder mobility     Baseline  05/24/2018-see objective measures, progressing     Time  4    Period  Weeks    Status  On-going      PT SHORT TERM GOAL #2   Title  Patient to demonstrate R shoulder AROM as being full  on all measured planes in order to show improved shoulder mobility and tolerance to activity     Baseline  AROM not full,  limited by pain    Time  4    Period  Weeks    Status  On-going      PT SHORT TERM GOAL #3   Title  Patient to be able to maintain correct functional posture at least 75% of the time in order to assist in maintaining correct shoulder mechanics and reducing pain     Baseline  he has no idea % of time he uses correct posture.  Able to demo in clinic.    Time  4    Period  Weeks    Status  New      PT SHORT TERM GOAL #4   Title  Patient to be compliant with appropriate HEP, to be updated PRN     Baseline  05/24/2018 - compliant, wife assist    Time  1    Period  Weeks    Status  Achieved      PT SHORT TERM GOAL #5   Title  Patient will increase cervical flexion and extension by 10 degrees without pain    Baseline       Time  4    Period  Weeks    Status  New    Target Date  07/11/18      Additional Short Term Goals   Additional Short Term Goals  Yes      PT SHORT TERM GOAL #6   Title  Patient will increase bilateral cervical rotation by 20 degrees     Time  4    Period  Weeks    Status  New    Target Date  07/11/18        PT Long Term Goals - 06/13/18 1156      PT LONG TERM GOAL #1   Title  Patient to demonstrate R shoulder strength as  being at least 4/5 in all tested groups in order to show improving functional strength and to reduce pain     Baseline  7/30- roughly 2+/5 to 3-/5 pain limited by gross assessment     Time  8    Period  Weeks    Status  On-going      PT LONG TERM GOAL #2   Title  Patient to be able to reach overhead with R shoulder without scapular winging or upper trap compensation pattern in order to show improved shoulder mechanics     Baseline  7/30- DNT     Time  8    Period  Weeks    Status  On-going      PT LONG TERM GOAL #3   Title  Patient to report pain as being no more than 2/10 R shoulder in order to improve QOL and functional task tolerance     Baseline  7/30- 4-5/10     Status  On-going      PT LONG TERM GOAL #4   Title  Patient to be able to perform all self care tasks with R shoulder independently with no increase in pain in order to show improved self-efficacy     Baseline  7/30- ongoing     Time  8    Period  Weeks    Status  On-going      PT LONG TERM GOAL #5   Title  Lt shoulder pain <=2/10 with ADLs  Baseline  severe pain and limited in use for ADLs on 8/27    Time  8    Period  Weeks    Status  On-going      PT LONG TERM GOAL #6   Title  Pt will demo proper biomechanical chain movement in overhead reaching and movements     Baseline  unable at ERO    Time  8    Period  Weeks    Status  Achieved      PT LONG TERM GOAL #7   Title  Pt will begin training appropriate return to work activities pain <=3/10 without radicular pain going down his arms.     Baseline  unable at ERO    Time  8    Period  Weeks    Status  Revised            Plan - 06/15/18 1044    Clinical Impression Statement  Shoulder AAROM not tolerated well today due to increased shoulder pain. Began gentle supine chin tucks and performed manual trigger point release to bilateral traps as well as gentle cervical side bend and rotation to tolerance. Pt reports manual traction decreases pain in  shoulders significantly. Trial of cervical mechanical traction performed today with pt closely monitored.     PT Next Visit Plan  continue with stretching and strengthening exercises as tolerated. assess benefit of traction.     PT Home Exercise Plan  Eval: grip squeezes, wrist flexion/extension, elbow flexion/extension, horizontal and vertical PROM pendulums standing , yellow band Rt shoulder scap retract and extension; left shoulder isometrics, right shoulder IR/ER with yellow band .  Hand and wrist AROM.  putty    Consulted and Agree with Plan of Care  Patient       Patient will benefit from skilled therapeutic intervention in order to improve the following deficits and impairments:  Decreased skin integrity, Increased fascial restricitons, Pain, Decreased mobility, Decreased scar mobility, Increased muscle spasms, Postural dysfunction, Decreased activity tolerance, Decreased range of motion, Decreased strength, Hypomobility, Impaired perceived functional ability, Impaired UE functional use, Decreased safety awareness, Impaired flexibility  Visit Diagnosis: Stiffness of right shoulder, not elsewhere classified  Acute pain of right shoulder  Muscle weakness (generalized)  Abnormal posture  Stiffness of left shoulder, not elsewhere classified  Acute pain of left shoulder  Cervicalgia  Other muscle spasm     Problem List Patient Active Problem List   Diagnosis Date Noted  . Cervical radiculopathy 06/08/2018  . Diarrhea 05/31/2018  . 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 Smoker 06/07/2014  . GERD (gastroesophageal reflux disease) 06/07/2014  . Hemorrhoid 05/27/2014    Dorene Ar, PTA 06/15/2018, 10:51 AM  [facility] [address] [place], [place], [address] Phone: [phone]   Fax:  [phone]  Name: Joshua Stephenson MRN: [id number] Date of Birth: [birth year]

## 2018-06-15 NOTE — Telephone Encounter (Signed)
Please advise 

## 2018-06-15 NOTE — Telephone Encounter (Signed)
He can get #5 of norco.

## 2018-06-16 ENCOUNTER — Other Ambulatory Visit (INDEPENDENT_AMBULATORY_CARE_PROVIDER_SITE_OTHER): Payer: Self-pay

## 2018-06-16 MED ORDER — HYDROCODONE-ACETAMINOPHEN 5-325 MG PO TABS: 1.0000 | ORAL_TABLET | Freq: Every day | ORAL | 0 refills | Status: DC

## 2018-06-16 NOTE — Telephone Encounter (Signed)
Rx printed and is ready for pick up at the front desk. Called patient no answer [facility] ready for pick up.

## 2018-06-23 ENCOUNTER — Other Ambulatory Visit (INDEPENDENT_AMBULATORY_CARE_PROVIDER_SITE_OTHER): Payer: Self-pay | Admitting: Physical Medicine and Rehabilitation

## 2018-06-23 ENCOUNTER — Encounter: Payer: Commercial Managed Care - PPO | Admitting: Internal Medicine

## 2018-06-23 DIAGNOSIS — F411 Generalized anxiety disorder: Secondary | ICD-10-CM

## 2018-06-23 MED ORDER — TRIAZOLAM 0.25 MG PO TABS: ORAL_TABLET | ORAL | 0 refills | Status: DC

## 2018-06-23 NOTE — Progress Notes (Signed)
Pre-procedure triazolam ordered for pre-operative anxiety.  

## 2018-06-27 ENCOUNTER — Ambulatory Visit: Payer: Commercial Managed Care - PPO | Attending: Adult Health | Admitting: Physical Therapy

## 2018-06-27 DIAGNOSIS — M62838 Other muscle spasm: Secondary | ICD-10-CM | POA: Insufficient documentation

## 2018-06-27 DIAGNOSIS — R293 Abnormal posture: Secondary | ICD-10-CM | POA: Insufficient documentation

## 2018-06-27 DIAGNOSIS — M6281 Muscle weakness (generalized): Secondary | ICD-10-CM | POA: Insufficient documentation

## 2018-06-27 DIAGNOSIS — M542 Cervicalgia: Secondary | ICD-10-CM | POA: Insufficient documentation

## 2018-06-27 DIAGNOSIS — M25612 Stiffness of left shoulder, not elsewhere classified: Secondary | ICD-10-CM | POA: Insufficient documentation

## 2018-06-27 DIAGNOSIS — M25611 Stiffness of right shoulder, not elsewhere classified: Secondary | ICD-10-CM | POA: Insufficient documentation

## 2018-06-27 DIAGNOSIS — M25512 Pain in left shoulder: Secondary | ICD-10-CM | POA: Insufficient documentation

## 2018-06-27 DIAGNOSIS — M25511 Pain in right shoulder: Secondary | ICD-10-CM | POA: Insufficient documentation

## 2018-06-28 ENCOUNTER — Telehealth: Payer: Self-pay | Admitting: Physical Therapy

## 2018-06-28 ENCOUNTER — Encounter: Payer: Self-pay | Admitting: Physical Therapy

## 2018-06-28 ENCOUNTER — Encounter (INDEPENDENT_AMBULATORY_CARE_PROVIDER_SITE_OTHER): Payer: Self-pay | Admitting: Physical Medicine and Rehabilitation

## 2018-06-28 NOTE — Telephone Encounter (Signed)
Patient contacted regarding missed visit. Therapy left message advising patient of his next visit. Patient advised to call if he can not make it.

## 2018-06-29 ENCOUNTER — Ambulatory Visit: Payer: Commercial Managed Care - PPO | Admitting: Physical Therapy

## 2018-07-04 ENCOUNTER — Ambulatory Visit: Payer: Commercial Managed Care - PPO | Admitting: Physical Therapy

## 2018-07-04 ENCOUNTER — Encounter: Payer: Self-pay | Admitting: Physical Therapy

## 2018-07-04 DIAGNOSIS — R293 Abnormal posture: Secondary | ICD-10-CM

## 2018-07-04 DIAGNOSIS — M25511 Pain in right shoulder: Secondary | ICD-10-CM | POA: Diagnosis present

## 2018-07-04 DIAGNOSIS — M25512 Pain in left shoulder: Secondary | ICD-10-CM

## 2018-07-04 DIAGNOSIS — M542 Cervicalgia: Secondary | ICD-10-CM

## 2018-07-04 DIAGNOSIS — M6281 Muscle weakness (generalized): Secondary | ICD-10-CM | POA: Diagnosis present

## 2018-07-04 DIAGNOSIS — M62838 Other muscle spasm: Secondary | ICD-10-CM

## 2018-07-04 DIAGNOSIS — M25611 Stiffness of right shoulder, not elsewhere classified: Secondary | ICD-10-CM

## 2018-07-04 DIAGNOSIS — M25612 Stiffness of left shoulder, not elsewhere classified: Secondary | ICD-10-CM | POA: Diagnosis present

## 2018-07-04 NOTE — Therapy (Addendum)
[place], [place], [address] Phone: [phone]   Fax:  [phone]  Physical Therapy Treatment/ Discharge   Patient Details  Name: Joshua Stephenson MRN: [id number] Date of Birth: 01-Aug-1956 Referring Provider (PT): Dorothyann Peng   Encounter Date: 07/04/2018  PT End of Session - 07/04/18 1534    Visit Number  31    Number of Visits  41    Date for PT Re-Evaluation  07/26/18    Authorization Type  [employer] PPO 1st vsit for neck 06/13/2018     Authorization Time Period  01/24/68 to 7/94/80; recert done 1/65/53; recert done 7/48/2707    PT Start Time  1017    PT Stop Time  1100    PT Time Calculation (min)  43 min    Activity Tolerance  Treatment limited secondary to medical complications (Comment);Patient limited by pain    Behavior During Therapy  [facility] for tasks assessed/performed       Past Medical History:  Diagnosis Date  . Anxiety   . Asthma    as a child  . 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 Depression   . GERD (gastroesophageal reflux disease)   . Hemorrhoids     Past Surgical History:  Procedure Laterality Date  . COLONOSCOPY  2011  . SHOULDER ARTHROSCOPY W/ ROTATOR CUFF REPAIR Right 02/08/2018    There were no vitals filed for this visit.  Subjective Assessment - 07/04/18 1148    Subjective  Patient reports his shoulders are feeling pretty good today but he is havingsevere pain in his neck that radiates into his shoulder. He is still having significant pain when his is using his arms.     Limitations  Other (comment)    How long can you sit comfortably?  6/26- riding in a car, about an hour     How long can you stand comfortably?  6/26- no limits in sling     How long can you walk comfortably?  6/26- no limits in sling     Patient Stated Goals  reduce pain, be able to go back to work     Currently in Pain?  Yes    Pain Score  6     Pain Location  Neck    Pain Orientation   Right;Left    Pain Descriptors / Indicators  Aching    Pain Type  Chronic pain    Pain Radiating Towards  soreness acrossed tha back and upper shoulders.     Pain Onset  More than a month ago    Aggravating Factors   reaching across the body     Pain Relieving Factors  heat, rest     Effect of Pain on Daily Activities  difficulty using the right arm     Multiple Pain Sites  Yes    Pain Score  6    Pain Location  Shoulder    Pain Orientation  Left    Pain Descriptors / Indicators  Aching    Pain Type  Chronic pain    Pain Onset  More than a month ago    Pain Frequency  Constant    Aggravating Factors   use of the shoulder     Pain Relieving Factors  rest     Effect of Pain on Daily Activities  difficulty using his arm for activity          [facility] PT Assessment - 07/04/18 0001  AROM   Right Shoulder Flexion  122 Degrees    Left Shoulder Flexion  133 Degrees    Cervical Flexion  26    Cervical Extension  25    Cervical - Right Rotation  12    Cervical - Left Rotation  31      Strength   Right/Left Shoulder  Left    Right Shoulder Flexion  3+/5    Right Shoulder Internal Rotation  4-/5    Right Shoulder External Rotation  4-/5    Left Shoulder Flexion  3+/5    Left Shoulder Internal Rotation  4-/5    Left Shoulder External Rotation  4-/5                   [facility] Adult PT Treatment/Exercise - 07/04/18 0001      Self-Care   Other Self-Care Comments   reviewed hom eexercises with the patient. He reports most of them make him feel weak and painful. He was advised he needs to eep his shoulders moving but all activity that cuases numbness and weakness to stop. Therapy reviewed plan going foreward to D/C until a plan is made for his neck.       Neck Exercises: Supine   Neck Retraction  10 reps    Neck Retraction Limitations  significant pain       Shoulder Exercises: Supine   Other Supine Exercises  attempted presses 2x10 significant pain per faces scale;  shoulder flexion extreme pain reported x10; bilateral ER increased pain reported      Manual Therapy   Soft tissue mobilization  trigger point release to upper traps and cervical spine.     Passive ROM  genelt PROM of shoulder with emphasis on bilateral extenral rotation.              PT Education - 07/04/18 1200    Education provided  Yes    Education Details  reviewed HEP and symptom mangement going forward. Reviewed POC going forward.     Person(s) Educated  Patient    Methods  Demonstration;Explanation;Verbal cues;Tactile cues    Comprehension  Verbalized understanding;Verbal cues required;Returned demonstration;Tactile cues required       PT Short Term Goals - 07/04/18 1236      PT SHORT TERM GOAL #1   Title  Patient to demonstrate R shoulder PROM as being full on all measured planes in order to show improved shoulder mobility     Baseline  patient still has end range pain     Time  4    Period  Weeks    Status  On-going      PT SHORT TERM GOAL #2   Title  Patient to demonstrate R shoulder AROM as being full on all measured planes in order to show improved shoulder mobility and tolerance to activity     Baseline  AROM not full,  limited by pain    Time  4    Period  Weeks    Status  On-going      PT SHORT TERM GOAL #3   Title  Patient to be able to maintain correct functional posture at least 75% of the time in order to assist in maintaining correct shoulder mechanics and reducing pain     Baseline  he has no idea % of time he uses correct posture.  Able to demo in clinic.    Time  4    Period  Weeks    Status  Not Met      PT SHORT TERM GOAL #4   Title  Patient to be compliant with appropriate HEP, to be updated PRN     Baseline  05/24/2018 - compliant, wife assist    Time  1    Period  Weeks    Status  Not Met      PT SHORT TERM GOAL #5   Title  Patient will increase cervical flexion and extension by 10 degrees without pain    Time  4    Period  Weeks     Status  Not Met      PT SHORT TERM GOAL #6   Title  Patient will increase bilateral cervical rotation by 20 degrees     Time  4    Period  Weeks    Status  Not Met        PT Long Term Goals - 07/04/18 1238      PT LONG TERM GOAL #1   Title  Patient to demonstrate R shoulder strength as being at least 4/5 in all tested groups in order to show improving functional strength and to reduce pain     Baseline  7/30- roughly 2+/5 to 3-/5 pain limited by gross assessment     Time  8    Period  Weeks    Status  On-going      PT LONG TERM GOAL #2   Title  Patient to be able to reach overhead with R shoulder without scapular winging or upper trap compensation pattern in order to show improved shoulder mechanics     Baseline  7/30- DNT     Time  8    Period  Weeks    Status  On-going      PT LONG TERM GOAL #3   Title  Patient to report pain as being no more than 2/10 R shoulder in order to improve QOL and functional task tolerance     Baseline  7/30- 4-5/10     Time  8    Period  Weeks    Status  Not Met      PT LONG TERM GOAL #4   Title  Patient to be able to perform all self care tasks with R shoulder independently with no increase in pain in order to show improved self-efficacy     Baseline  7/30- ongoing     Time  8    Period  Weeks    Status  Not Met      PT LONG TERM GOAL #5   Title  Lt shoulder pain <=2/10 with ADLs    Baseline  Pain can reach a 6-8/10     Time  8    Period  Weeks    Status  On-going      PT LONG TERM GOAL #6   Title  Pt will demo proper biomechanical chain movement in overhead reaching and movements     Baseline  unable at ERO    Time  8    Period  Weeks    Status  Not Met      PT LONG TERM GOAL #7   Title  Pt will begin training appropriate return to work activities pain <=3/10 without radicular pain going down his arms.     Baseline  unable to perfrom     Time  8    Period  Weeks    Status  Not Met  Plan - 07/04/18 1202     Clinical Impression Statement  At this point the patient has reached max benefit from therapy. He is having significant pain with AARM with a wand. At this point he is 5 months post op on the right shoulder and 3 months on the left. He should be able ot perfrom AAROm without significant pain. Per faces pain scalehis pain was ablut an 8/10. He has limited tolerance to soft tissue mobilization and manual therapy. He reports his exercises at home cause him to feel weak after. He has been doing them for many weeks now and has no real gains in strength. He reports he is always very sore after his visits. He states he will possibly be having surgery in [place] again for his neck in a few weeks. His neck may be limiting his progress with his shoulder. If it is not his neck then he needs to consult with his surgeon in [place] about his shoulders. He has made slight ains in active motion of his shoulders but no gains in strength or pain. D/C to HEP at this time.     Clinical Presentation  Evolving    Clinical Decision Making  Moderate    Rehab Potential  Good    PT Frequency  2x / week    PT Duration  8 weeks    PT Treatment/Interventions  ADLs/Self Care Home Management;Biofeedback;Cryotherapy;Electrical Stimulation;Iontophoresis 30m/ml Dexamethasone;Moist Heat;Traction;Ultrasound;Functional mobility training;Therapeutic activities;Therapeutic exercise;Balance training;Neuromuscular re-education;Patient/family education;Manual techniques;Scar mobilization;Passive range of motion;Dry needling;Energy conservation;Taping    PT Next Visit Plan  continue with stretching and strengthening exercises as tolerated. assess benefit of traction.     PT Home Exercise Plan  D/C to HEP     Consulted and Agree with Plan of Care  Patient       Patient will benefit from skilled therapeutic intervention in order to improve the following deficits and impairments:  Decreased skin integrity, Increased fascial restricitons,  Pain, Decreased mobility, Decreased scar mobility, Increased muscle spasms, Postural dysfunction, Decreased activity tolerance, Decreased range of motion, Decreased strength, Hypomobility, Impaired perceived functional ability, Impaired UE functional use, Decreased safety awareness, Impaired flexibility  Visit Diagnosis: Stiffness of right shoulder, not elsewhere classified  Acute pain of right shoulder  Muscle weakness (generalized)  Abnormal posture  Stiffness of left shoulder, not elsewhere classified  Acute pain of left shoulder  Cervicalgia  Other muscle spasm  PHYSICAL THERAPY DISCHARGE SUMMARY  Visits from Start of Care: 31  Current functional level related to goals / functional outcomes: Slight improvement in movement of bilateral shoulders     Remaining deficits: Burning pain into the shoulders and neck    Education / Equipment: HEP  Plan: Patient agrees to discharge.  Patient goals were not met. Patient is being discharged due to lack of progress.  ?????       Problem List Patient Active Problem List   Diagnosis Date Noted  . Cervical radiculopathy 06/08/2018  . Diarrhea 05/31/2018  . COPD, mild ([place] 06/07/2014  . Smoker 06/07/2014  . GERD (gastroesophageal reflux disease) 06/07/2014  . Hemorrhoid 05/27/2014    DCarney LivingPT DPT 07/04/2018, 3:35 PM  [facility] [place] [address]Phone: [phone]  Fax:  [phone] Name: Joshua CollymoreMRN: [id number] of Birth: 106-15-1957

## 2018-07-06 ENCOUNTER — Ambulatory Visit: Payer: Commercial Managed Care - PPO | Admitting: Physical Therapy

## 2018-07-06 ENCOUNTER — Telehealth: Payer: Self-pay | Admitting: Adult Health

## 2018-07-06 ENCOUNTER — Other Ambulatory Visit: Payer: Self-pay | Admitting: Adult Health

## 2018-07-06 MED ORDER — HYDROCODONE-ACETAMINOPHEN 5-325 MG PO TABS: 1.0000 | ORAL_TABLET | Freq: Four times a day (QID) | ORAL | 0 refills | Status: DC | PRN

## 2018-07-06 NOTE — Telephone Encounter (Signed)
I have sent in 20 tabs. Unfortunately, if he is going to need more than he will need to be sent to pain management

## 2018-07-06 NOTE — Telephone Encounter (Signed)
Copied from [place] [id number]. Topic: Quick Communication - Rx Refill/Question >> Jul 06, 2018 12:01 PM Margot Ables wrote: Medication: Pt states he has tried Tylenol 3 and Tramadol that did not work for pain.  He is asking for oxyCODONE-acetaminophen (PERCOCET) 10-325 MG tablet that was prescribed by [facility] back in June. He states that his other doctor in [place] is leaning towards surgery. Dr. Erlinda Hong had discussed injections but they won't relieve pain for more than a day or two. He hasn't been scheduled for either though.  Preferred Pharmacy (with phone number or street name): [id number] [place], [place] [phone] (Phone) [phone] (Fax)

## 2018-07-07 NOTE — Telephone Encounter (Signed)
Left message on machine for patient to return our call CRM 

## 2018-07-11 ENCOUNTER — Telehealth (INDEPENDENT_AMBULATORY_CARE_PROVIDER_SITE_OTHER): Payer: Self-pay | Admitting: *Deleted

## 2018-07-11 NOTE — Telephone Encounter (Signed)
This is not a patient of mine.  Please forward to appropriate physician.  Thank you

## 2018-07-11 NOTE — Telephone Encounter (Signed)
I sent in Rx for triazolam on 10/3? Ask him to check with pharmacy if nothing or they need new rx then let me know

## 2018-07-13 ENCOUNTER — Ambulatory Visit: Payer: Commercial Managed Care - PPO | Admitting: Physical Therapy

## 2018-07-15 ENCOUNTER — Encounter: Payer: Commercial Managed Care - PPO | Admitting: Physical Therapy

## 2018-07-18 ENCOUNTER — Encounter: Payer: Commercial Managed Care - PPO | Admitting: Physical Therapy

## 2018-07-21 ENCOUNTER — Encounter: Payer: Commercial Managed Care - PPO | Admitting: Physical Therapy

## 2018-07-25 ENCOUNTER — Encounter (INDEPENDENT_AMBULATORY_CARE_PROVIDER_SITE_OTHER): Payer: Self-pay | Admitting: Physical Medicine and Rehabilitation

## 2018-07-25 ENCOUNTER — Encounter: Payer: Commercial Managed Care - PPO | Admitting: Physical Therapy

## 2018-07-27 ENCOUNTER — Encounter: Payer: Commercial Managed Care - PPO | Admitting: Physical Therapy

## 2018-08-01 ENCOUNTER — Encounter: Payer: Commercial Managed Care - PPO | Admitting: Physical Therapy

## 2018-08-03 ENCOUNTER — Encounter: Payer: Commercial Managed Care - PPO | Admitting: Physical Therapy

## 2018-08-31 ENCOUNTER — Other Ambulatory Visit: Payer: Self-pay | Admitting: Family Medicine

## 2018-08-31 NOTE — Telephone Encounter (Signed)
We stopped tramadol in the past as it was not working. We discussed that if he required more pain medication on an ongoing basis then we would need to refer to pain management

## 2018-09-09 ENCOUNTER — Other Ambulatory Visit: Payer: Self-pay | Admitting: Family Medicine

## 2019-06-25 IMAGING — MR MR CERVICAL SPINE W/O CM
4 of 5 series · 27 of 48 positions shown · non-contrast
Comparison: Two views cervical spine performed at [REDACTED] 05/06/2018.

CLINICAL DATA: Neck pain with bilateral shoulder and arm pain and
weakness for several months. No known injury.

EXAM:
MRI CERVICAL SPINE WITHOUT CONTRAST
TECHNIQUE: Multiplanar, multisequence MR imaging of the cervical spine was
performed. No intravenous contrast was administered.

[Series 3: T1 · sagittal · 3.3mm · 0.41mm/px · 8 of 13 slices shown]
[im 1/13]
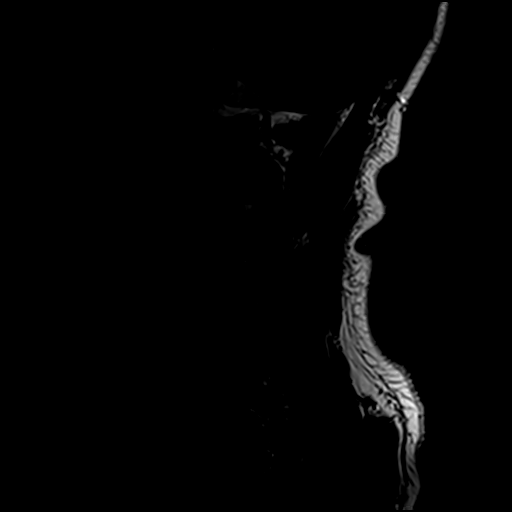
[im 2/13]
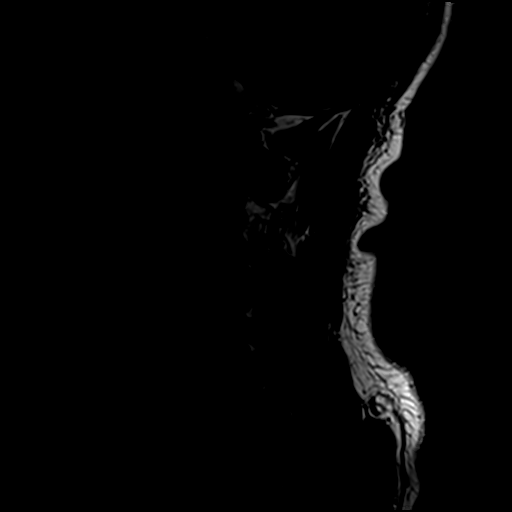
[im 4/13]
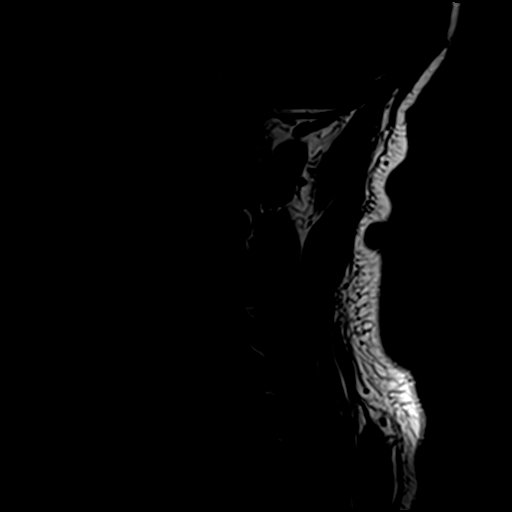
[im 6/13]
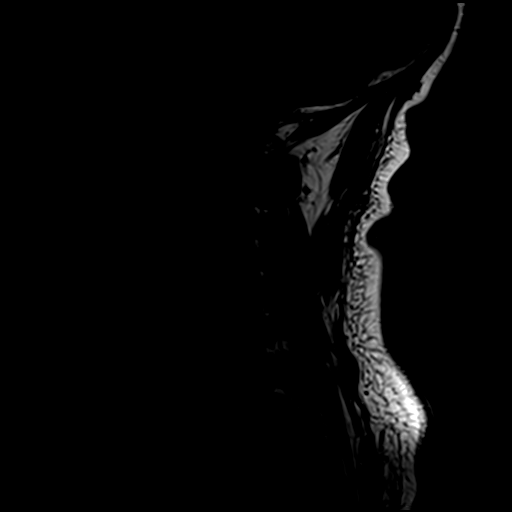
[im 7/13]
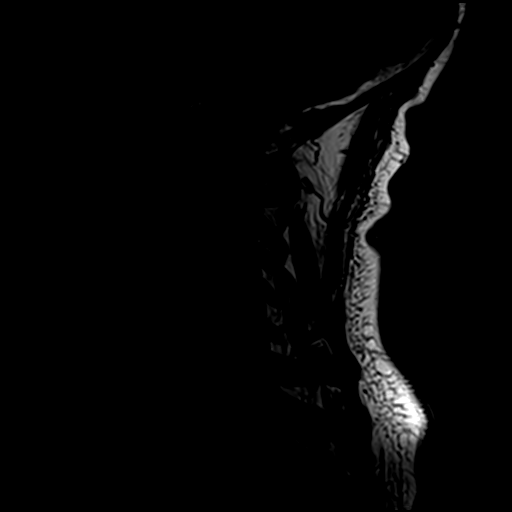
[im 9/13]
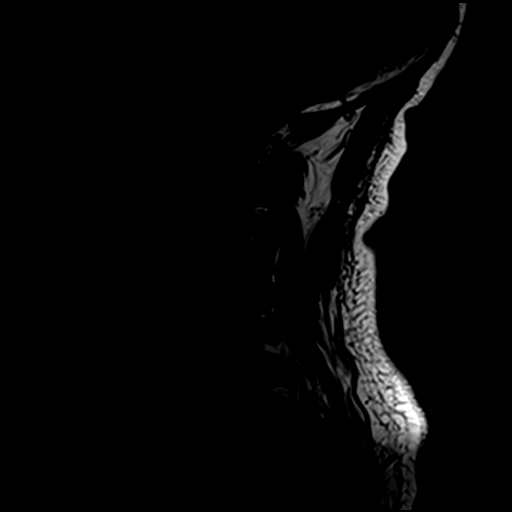
[im 11/13]
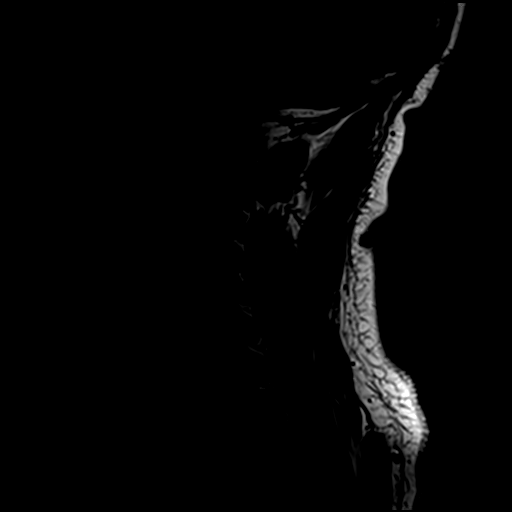
[im 13/13]
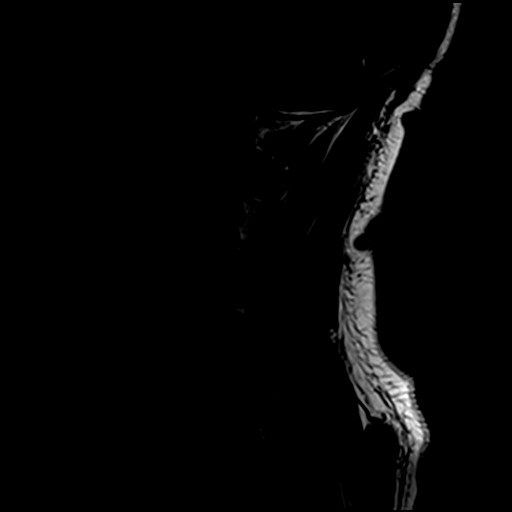

[Series 4: STIR · sagittal · 3.3mm · 0.82mm/px · 3 of 13 slices shown]
[im 3/13]
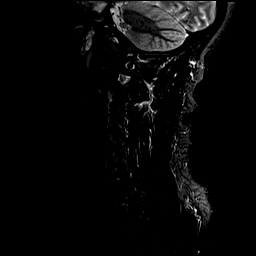
[im 7/13]
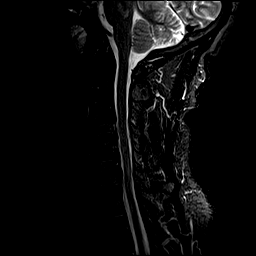
[im 11/13]
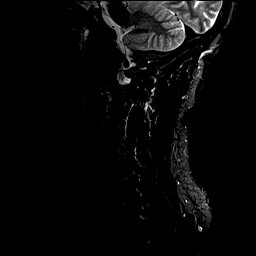

[Series 7: T2 · axial · 3.0mm · 0.74mm/px · z∈[-102,-21]mm · 9 of 24 slices shown (1 of 2)]
[im 1/24]
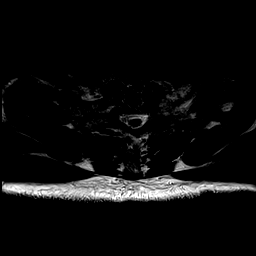
[im 4/24]
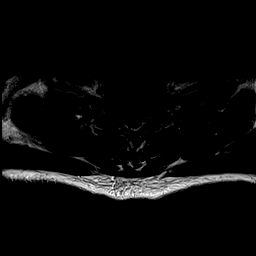
[im 8/24]
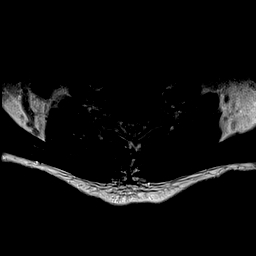
[im 10/24]
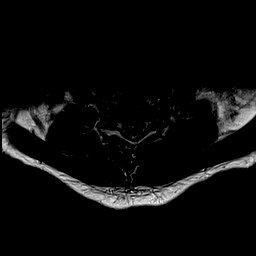
[im 12/24]
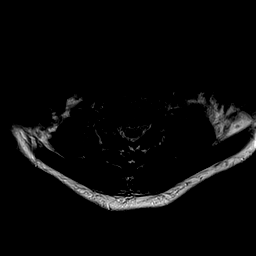
[im 14/24]
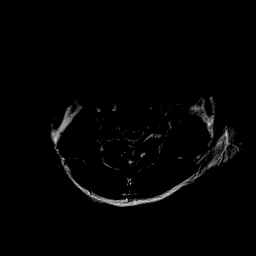
[im 16/24]
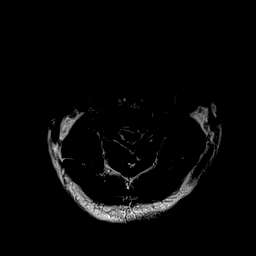
[im 20/24]
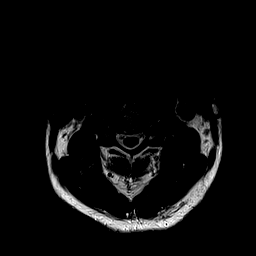
[im 24/24]
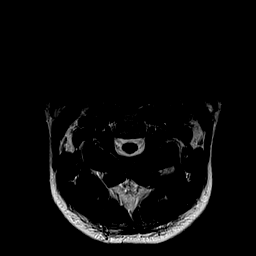

[Series 8: T2 · sagittal · 3.3mm · 0.41mm/px · 7 of 13 slices shown (2 of 2)]
[im 1/13]
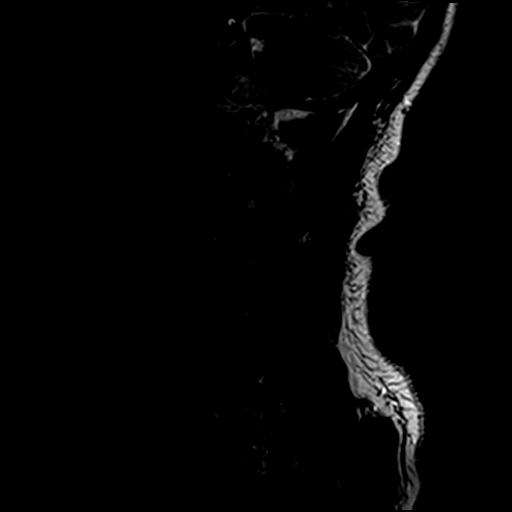
[im 3/13]
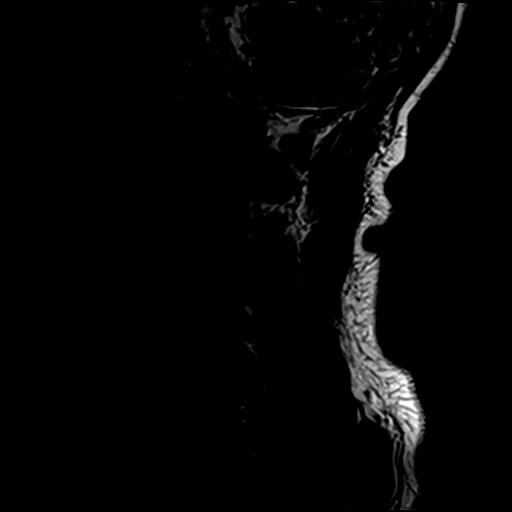
[im 5/13]
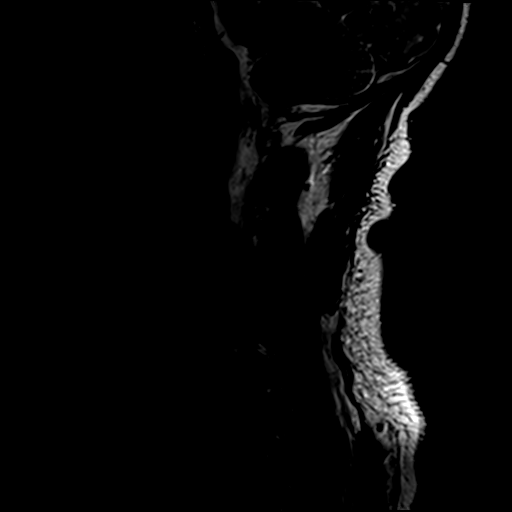
[im 7/13]
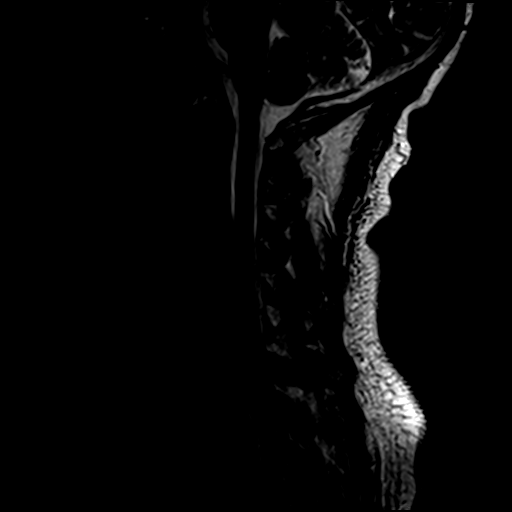
[im 9/13]
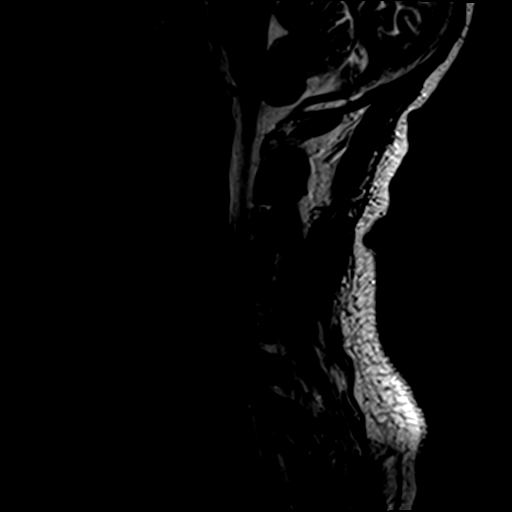
[im 11/13]
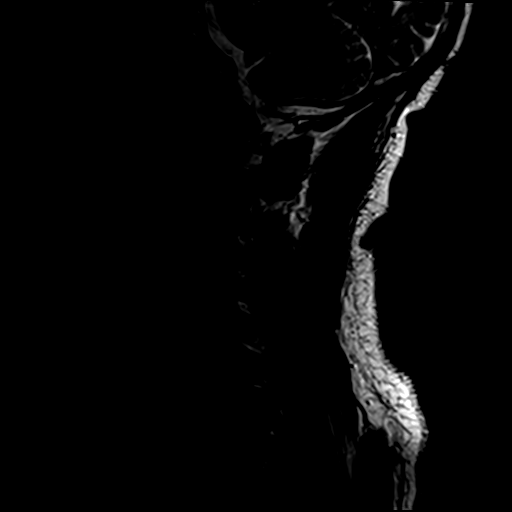
[im 13/13]
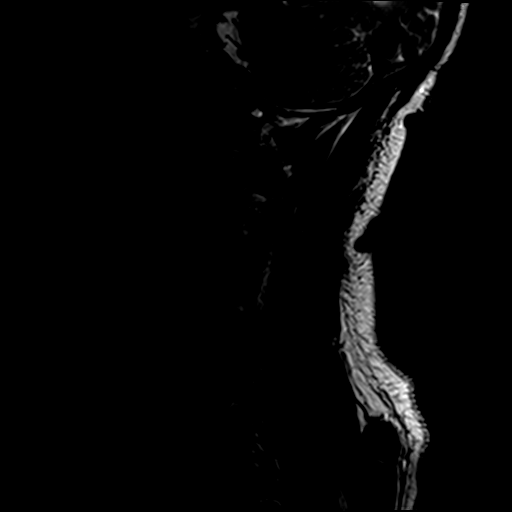

[27 of 48 positions shown; findings below may reference images not displayed]

FINDINGS: Alignment: Straightening of the normal cervical lordosis and trace
retrolisthesis C3 on C4 are seen.

Vertebrae: No fracture or worrisome lesion.

Cord: Normal signal throughout.

Posterior Fossa, vertebral arteries, paraspinal tissues: Negative.

Disc levels:

C2-3: Mild facet degenerative change.  Otherwise negative.

C3-4: Shallow disc bulge to the right and uncovertebral disease are
seen. Moderately severe to severe foraminal narrowing is worse on
the right. The central canal is open.

C4-5: There is a disc bulge and superimposed left paracentral
protrusion. Disc indents the ventral aspect of the left hemicord.
Uncovertebral disease causes marked bilateral foraminal narrowing.

C5-6: There is a shallow disc bulge and bilateral uncovertebral
disease. The central canal is open. Moderately severe to severe
bilateral foraminal narrowing is worse on the right.

C6-7: There is a disc bulge and left worse than right uncovertebral
disease. The ventral thecal sac is effaced. Moderately severe to
severe foraminal narrowing is worse on the left.

C7-T1: Shallow disc bulge and bilateral uncovertebral disease. The
ventral thecal sac is narrowed but not effaced. Marked bilateral
foraminal narrowing is present.
IMPRESSION: Moderately severe to severe bilateral foraminal narrowing at C3-4 is
worse on the right.

Disc deforms the ventral left hemicord at C4-5 where there is a
bulge and superimposed left paracentral protrusion. Severe bilateral
foraminal narrowing is present at this level.

Moderately severe to severe bilateral foraminal narrowing at C5-6
appears worse on the right.

Moderately severe to severe bilateral foraminal narrowing at C6-7 is
worse on the left. The ventral thecal sac is effaced by disc at this
level.

Severe bilateral foraminal narrowing C7-T1.

## 2020-02-25 ENCOUNTER — Ambulatory Visit (HOSPITAL_COMMUNITY)
Admission: EM | Admit: 2020-02-25 | Discharge: 2020-02-25 | Disposition: A | Discharge status: Home / Self Care | Payer: BLUE CROSS/BLUE SHIELD | Attending: Family Medicine | Admitting: Family Medicine

## 2020-02-25 ENCOUNTER — Other Ambulatory Visit: Payer: Self-pay

## 2020-02-25 ENCOUNTER — Encounter (HOSPITAL_COMMUNITY): Payer: Self-pay | Admitting: Emergency Medicine

## 2020-02-25 DIAGNOSIS — M316 Other giant cell arteritis: Secondary | ICD-10-CM

## 2020-02-25 LAB — SEDIMENTATION RATE: Sed Rate: 3 mm/hr (ref 0–16)

## 2020-02-25 MED ORDER — PREDNISONE 20 MG PO TABS
ORAL_TABLET | ORAL | Status: AC
  Filled 2020-02-25: qty 3

## 2020-02-25 MED ORDER — PREDNISONE 20 MG PO TABS
60.0000 mg | ORAL_TABLET | Freq: Once | ORAL | Status: AC
  Administered 2020-02-25: 60 mg via ORAL

## 2020-02-25 NOTE — ED Triage Notes (Signed)
Noticed a area of fullness to right side of head 2 days.  Area has become larger and is softer.  Patient reports he has a headache

## 2020-02-25 NOTE — ED Provider Notes (Signed)
[place]    CSN: [id number] Arrival date & time: 02/25/20  1633      History   Chief Complaint Chief Complaint  Patient presents with  . Cyst    HPI Joshua Stephenson is a 64 y.o. male.   HPI  Patient is here for headache and swelling on the right side of his forehead.  He states the swelling started 2 days ago.  It is around the area of his temporal artery.  He has not noticed any pulsing.  He does not have any visual changes.  No numbness or weakness into extremities.  No loss of strength or balance.  No loss of cognition or trouble with speech.  He has never had any inflammatory problems or arthritis.  He does have multiple orthopedic problems.  He does not have any arthralgias, body aches, or weakness.  He does not have any rash.  He states starting today his left elbow with swelling as well.  He does not have a history of migraines.  No nausea or vomiting.  No photophobia or scotoma Patient has mild COPD.  He quit smoking a year ago. He states he is otherwise in good health. 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 Anxiety   . Asthma    as a child  . COPD, mild ([place]) 06/07/2014  . Depression   . GERD (gastroesophageal reflux disease)   . Hemorrhoids     Patient Active Problem List   Diagnosis Date Noted  . Cervical radiculopathy 06/08/2018  . Diarrhea 05/31/2018  . COPD, mild ([place]) 06/07/2014  . Smoker 06/07/2014  . GERD (gastroesophageal reflux disease) 06/07/2014  . Hemorrhoid 05/27/2014    Past Surgical History:  Procedure Laterality Date  . COLONOSCOPY  2011  . SHOULDER ARTHROSCOPY W/ ROTATOR CUFF REPAIR Right 02/08/2018       Home Medications    Prior to Admission medications   Medication Sig Start Date End Date Taking? Authorizing Provider  Esomeprazole Magnesium (NEXIUM PO) Take by mouth.     [provider]  polyethylene glycol powder (GLYCOLAX/MIRALAX) powder Take 1 Container by mouth once.    [provider]  dicyclomine (BENTYL) 20 MG tablet Take 1 tablet (20 mg total) by mouth 4 (four) times daily -  before meals and at bedtime. As needed for cramps and diarrhea 04/04/18 02/25/20  Gatha Mayer, MD  escitalopram (LEXAPRO) 10 MG tablet Take 1 tablet (10 mg total) by mouth daily. 04/26/18 02/25/20  Nafziger, Tommi Rumps, NP  triazolam (HALCION) 0.25 MG tablet Take 1 tab by mouth 1 hour prior to procedure with very light food, do not drive motor vehicle. 06/23/18 02/25/20  Magnus Sinning, MD    Family History Family History  Problem Relation Age of Onset  . Diabetes Father   . Heart disease Father   . Hypertension Father   . Breast cancer Mother   . Hypertension Mother   . Cancer Sister        type unknown  . Cancer Brother        oral cancer   . Breast cancer Other        niece  . Cancer Brother        oral cancer   . Colon cancer Neg Hx   . Esophageal cancer Neg Hx   . Rectal cancer Neg Hx   .  Stomach cancer Neg Hx     Social History Social History   Tobacco Use  . Smoking status: Former Smoker    Packs/day: 1.00    Years: 20.00    Pack years: 20.00    Types: Cigarettes    Quit date: 12/2017    Years since quitting: 2.1  . Smokeless tobacco: Never Used  Substance Use Topics  . Alcohol use: Not Currently  . Drug use: No     Allergies   Patient has no known allergies.   Review of Systems Review of Systems  Constitutional: Negative for chills, fatigue and fever.  HENT: Negative for congestion.   Eyes: Negative for photophobia and visual disturbance.  Musculoskeletal: Negative for arthralgias and myalgias.  Neurological: Positive for weakness and headaches. Negative for speech difficulty and numbness.  Psychiatric/Behavioral: Negative for behavioral problems and confusion.     Physical Exam Triage Vital Signs ED Triage Vitals  Enc  Vitals Group     BP 02/25/20 1748 133/87     Pulse Rate 02/25/20 1748 71     Resp 02/25/20 1748 18     Temp 02/25/20 1748 99 F (37.2 C)     Temp Source 02/25/20 1748 Oral     SpO2 02/25/20 1748 98 %     Weight --      Height --      Head Circumference --      Peak Flow --      Pain Score 02/25/20 1745 8     Pain Loc --      Pain Edu? --      Excl. in [place]? --    No data found.  Updated Vital Signs BP 133/87 (BP Location: Left Arm)   Pulse 71   Temp 99 F (37.2 C) (Oral)   Resp 18   SpO2 98%       Physical Exam Constitutional:      General: He is not in acute distress.    Appearance: He is well-developed and normal weight.  HENT:     Head: Normocephalic and atraumatic.      Comments: Over the areas of the temporal artery there is visible soft tissue swelling that is soft.  Pulses are palpable.  There is no tenderness to palpation.    Right Ear: Tympanic membrane, ear canal and external ear normal.     Left Ear: Tympanic membrane and ear canal normal.     Nose: Nose normal.  Eyes:     Conjunctiva/sclera: Conjunctivae normal.     Pupils: Pupils are equal, round, and reactive to light.  Cardiovascular:     Rate and Rhythm: Normal rate.     Heart sounds: Normal heart sounds.  Pulmonary:     Effort: Pulmonary effort is normal. No respiratory distress.     Breath sounds: Normal breath sounds.  Musculoskeletal:        General: Normal range of motion.     Cervical back: Normal range of motion.     Right lower leg: No edema.     Left lower leg: No edema.  Lymphadenopathy:     Cervical: No cervical adenopathy.  Skin:    General: Skin is warm and dry.  Neurological:     General: No focal deficit present.     Mental Status: He is alert. Mental status is at baseline.     Cranial Nerves: No cranial nerve deficit.     Motor: No weakness.     Coordination: Coordination normal.  Gait: Gait normal.     Deep Tendon Reflexes: Reflexes normal.  Psychiatric:        Mood  and Affect: Mood normal.        Behavior: Behavior normal.      UC Treatments / Results  Labs (all labs ordered are listed, but only abnormal results are displayed) Labs Reviewed  SEDIMENTATION RATE    EKG   Radiology No results found.  Procedures Procedures (including critical care time)  Medications Ordered in UC Medications  predniSONE (DELTASONE) tablet 60 mg (60 mg Oral Given 02/25/20 1821)    Initial Impression / Assessment and Plan / UC Course  I have reviewed the triage vital signs and the nursing notes.  Pertinent labs & imaging results that were available during my care of the patient were reviewed by me and considered in my medical decision making (see chart for details).     I explained to the patient that he is here closing time.  I cannot get back his sed rate tonight.  I prefer to have a sed rate prior to treating him with prednisone.  Patient states he has had steroids many times in the past for his orthopedic condition with no difficulty.  We will give him a single dose of 60 mg of prednisone.  I did explain to him that this may cause him to be jittery or have difficulty sleeping.  It also should help with the headache pain. I am in the office tomorrow.  I will check on his cell right and give him a call. Final Clinical Impressions(s) / UC Diagnoses   Final diagnoses:  Temporal arteritis ([place])     Discharge Instructions     I am worried you may have temporal arteritis This is inflammation of an artery in your temple/forehead.  It causes a headache It can be confirmed by blood work which will not be back until later tonight I am going to draw the blood and check on it tomorrow I am going to treat you with temporal arteritis with a dose of prednisone tonight If the blood work is positive you will continue this medication and I will refer you to a specialist You should see rapid improvement   ED Prescriptions    None     I have reviewed the PDMP  during this encounter.   Raylene Everts, MD 02/25/20 [phone]

## 2020-02-25 NOTE — Discharge Instructions (Addendum)
I am worried you may have temporal arteritis This is inflammation of an artery in your temple/forehead.  It causes a headache It can be confirmed by blood work which will not be back until later tonight I am going to draw the blood and check on it tomorrow I am going to treat you with temporal arteritis with a dose of prednisone tonight If the blood work is positive you will continue this medication and I will refer you to a specialist You should see rapid improvement

## 2020-03-01 DIAGNOSIS — M316 Other giant cell arteritis: Secondary | ICD-10-CM | POA: Insufficient documentation

## 2020-07-31 DIAGNOSIS — M5412 Radiculopathy, cervical region: Secondary | ICD-10-CM | POA: Diagnosis not present

## 2020-07-31 DIAGNOSIS — M4802 Spinal stenosis, cervical region: Secondary | ICD-10-CM | POA: Diagnosis not present

## 2020-08-17 DIAGNOSIS — H0012 Chalazion right lower eyelid: Secondary | ICD-10-CM | POA: Diagnosis not present

## 2020-08-17 DIAGNOSIS — H02881 Meibomian gland dysfunction right upper eyelid: Secondary | ICD-10-CM | POA: Diagnosis not present

## 2020-08-17 DIAGNOSIS — H00021 Hordeolum internum right upper eyelid: Secondary | ICD-10-CM | POA: Diagnosis not present

## 2020-08-17 DIAGNOSIS — H0015 Chalazion left lower eyelid: Secondary | ICD-10-CM | POA: Diagnosis not present

## 2020-09-11 DIAGNOSIS — I25118 Atherosclerotic heart disease of native coronary artery with other forms of angina pectoris: Secondary | ICD-10-CM | POA: Diagnosis not present

## 2020-09-11 DIAGNOSIS — M549 Dorsalgia, unspecified: Secondary | ICD-10-CM | POA: Diagnosis not present

## 2020-09-11 DIAGNOSIS — G9589 Other specified diseases of spinal cord: Secondary | ICD-10-CM | POA: Diagnosis not present

## 2020-09-11 DIAGNOSIS — R0782 Intercostal pain: Secondary | ICD-10-CM | POA: Diagnosis not present

## 2020-09-30 DIAGNOSIS — M5412 Radiculopathy, cervical region: Secondary | ICD-10-CM | POA: Diagnosis not present

## 2020-09-30 DIAGNOSIS — R030 Elevated blood-pressure reading, without diagnosis of hypertension: Secondary | ICD-10-CM | POA: Diagnosis not present

## 2020-09-30 DIAGNOSIS — M4802 Spinal stenosis, cervical region: Secondary | ICD-10-CM | POA: Diagnosis not present

## 2020-10-01 ENCOUNTER — Other Ambulatory Visit: Payer: Self-pay

## 2020-10-01 ENCOUNTER — Ambulatory Visit: Payer: Medicare HMO | Admitting: Cardiology

## 2020-10-01 ENCOUNTER — Encounter: Payer: Self-pay | Admitting: Cardiology

## 2020-10-01 VITALS — BP 145/85 | HR 79 | Resp 16 | Ht 67.0 in | Wt 185.6 lb

## 2020-10-01 DIAGNOSIS — Z87891 Personal history of nicotine dependence: Secondary | ICD-10-CM | POA: Diagnosis not present

## 2020-10-01 DIAGNOSIS — I10 Essential (primary) hypertension: Secondary | ICD-10-CM

## 2020-10-01 DIAGNOSIS — Z8249 Family history of ischemic heart disease and other diseases of the circulatory system: Secondary | ICD-10-CM

## 2020-10-01 DIAGNOSIS — R072 Precordial pain: Secondary | ICD-10-CM | POA: Diagnosis not present

## 2020-10-01 DIAGNOSIS — R0609 Other forms of dyspnea: Secondary | ICD-10-CM

## 2020-10-01 MED ORDER — METOPROLOL TARTRATE 50 MG PO TABS: 50.0000 mg | ORAL_TABLET | Freq: Two times a day (BID) | ORAL | 0 refills | Status: DC

## 2020-10-01 NOTE — Progress Notes (Signed)
Date:  10/01/2020   ID:  [name], DOB April 18, 1956, MRN [id number]  PCP:  Sueanne Margarita, DO  Cardiologist:  Rex Kras, DO, [facility] (established care October 01, 2020)  REASON FOR CONSULT: Chest pain  REQUESTING PHYSICIAN:  Sueanne Margarita, DO [place],  [place] [address]  Chief Complaint  Patient presents with  . Chest Pain  . New Patient (Initial Visit)    Referred by Sueanne Margarita, DO    HPI  Joshua Stephenson is a 65 y.o. male who presents to the office with a chief complaint of " chest pain." Patient's past medical history and cardiovascular risk factors include: Benign essential hypertension, former smoker (69-pack-years hx of smoking), COPD, family history of premature CAD.  He is referred to the office at the request of Sueanne Margarita, DO for evaluation of chest pain.  Chest pain cardiology patient states that he has been having chest discomfort for the last 1 year.  Is located substernally, usually brought on by heavy lifting or prolonged walking.  He describes it as a sharp-like sensation, lasting 5 to 10 minutes in duration, no worsening factors.  Patient says the symptoms are improved with resting and taking a deep breath.  The intensity, frequency, and duration of his symptoms have not changed over the last 1 year.  Associated symptoms include dyspnea on exertion.  Patient states that he has been smoking for the last 46 years approximately 1.5 packs/day which accounts for at least 69-pack-year history of smoking.  He may have underlying undiagnosed COPD and is currently not on any inhalers.   Not vaccinated for COVID 19 infection.   FUNCTIONAL STATUS: No structured exercise program or daily routine.   ALLERGIES: No Known Allergies  MEDICATION LIST PRIOR TO VISIT: Current Meds  Medication Sig  . metoprolol tartrate (LOPRESSOR) 50 MG tablet Take 1 tablet (50 mg total) by mouth 2 (two) times daily. Hold if systolic blood pressure  (top number) less than 100 mmHg or heart rate less than 60 bpm (pulse).     PAST MEDICAL HISTORY: Past Medical History:  Diagnosis Date  . Anxiety   . Asthma    as a child  . COPD, mild ([place]) 06/07/2014  . Depression   . GERD (gastroesophageal reflux disease)   . Hemorrhoids     PAST SURGICAL HISTORY: Past Surgical History:  Procedure Laterality Date  . COLONOSCOPY  2011  . SHOULDER ARTHROSCOPY W/ ROTATOR CUFF REPAIR Right 02/08/2018    FAMILY HISTORY: The patient family history includes Breast cancer in his mother; Cancer in his brother, brother, and sister; Diabetes in his father; Heart disease in his father; Hypertension in his father and mother.  SOCIAL HISTORY:  The patient  reports that he quit smoking about 2 years ago. His smoking use included cigarettes. He has a 20.00 pack-year smoking history. He has never used smokeless tobacco. He reports previous alcohol use. He reports that he does not use drugs.  REVIEW OF SYSTEMS: Review of Systems  Constitutional: Negative for chills and fever.  HENT: Negative for hoarse voice and nosebleeds.   Eyes: Negative for discharge, double vision and pain.  Cardiovascular: Positive for chest pain and dyspnea on exertion. Negative for claudication, leg swelling, near-syncope, orthopnea, palpitations, paroxysmal nocturnal dyspnea and syncope.  Respiratory: Positive for shortness of breath. Negative for hemoptysis.   Musculoskeletal: Negative for muscle cramps and myalgias.  Gastrointestinal: Negative for abdominal pain, constipation, diarrhea, hematemesis, hematochezia, melena, nausea and vomiting.  Neurological: Negative for dizziness and light-headedness.    PHYSICAL EXAM: Vitals with BMI 10/01/2020 02/25/2020 06/06/2018  Height _0  - _1   Weight 185 lbs 10 oz - 162 lbs  BMI 56.31 - 49.70  Systolic 263 785 -  Diastolic 85 87 -  Pulse 79 71 -   CONSTITUTIONAL: Well-developed and well-nourished. No acute distress.  SKIN: Skin  is warm and dry. No rash noted. No cyanosis. No pallor. No jaundice HEAD: Normocephalic and atraumatic.  EYES: No scleral icterus MOUTH/THROAT: Moist oral membranes.  NECK: No JVD present. No thyromegaly noted. No carotid bruits  LYMPHATIC: No visible cervical adenopathy.  CHEST Normal respiratory effort. No intercostal retractions  LUNGS: Decreased breath sounds bilaterally, No stridor. No wheezes. No rales.  CARDIOVASCULAR: Regular rate and rhythm, positive S1-S2, no murmurs rubs or gallops appreciated. ABDOMINAL: No apparent ascites.  EXTREMITIES: No peripheral edema  HEMATOLOGIC: No significant bruising NEUROLOGIC: Oriented to person, place, and time. Nonfocal. Normal muscle tone.  PSYCHIATRIC: Normal mood and affect. Normal behavior. Cooperative  CARDIAC DATABASE: EKG: 10/01/2020: Normal sinus rhythm, 79 bpm, left axis deviation, consider old anteroseptal infarct, without underlying ischemia or injury pattern.   Echocardiogram: No results found for this or any previous visit from the past 1095 days.   Stress Testing: No results found for this or any previous visit from the past 1095 days.  Heart Catheterization: None  LABORATORY DATA: CBC Latest Ref Rng & Units 04/04/2018 10/06/2017 06/16/2017  WBC 4.0 - 10.5 K/uL 6.4 4.9 7.1  Hemoglobin 13.0 - 17.0 g/dL 14.2 13.8 13.9  Hematocrit 39.0 - 52.0 % 40.5 39.6 40.4  Platelets 150.0 - 400.0 K/uL 279.0 236.0 146.0 Result may be falsely decreased due to platelet clumping.(L)    CMP Latest Ref Rng & Units 10/06/2017 06/16/2017 06/07/2014  Glucose 70 - 99 mg/dL 89 81 -  BUN 6 - 23 mg/dL 20 13 -  Creatinine 0.40 - 1.50 mg/dL 0.96 0.98 -  Sodium 135 - 145 mEq/L 141 140 -  Potassium 3.5 - 5.1 mEq/L 4.1 4.1 -  Chloride 96 - 112 mEq/L 105 107 -  CO2 19 - 32 mEq/L 30 27 -  Calcium 8.4 - 10.5 mg/dL 9.2 9.6 -  Total Protein 6.0 - 8.3 g/dL 6.8 7.1 7.1  Total Bilirubin 0.2 - 1.2 mg/dL 0.6 0.5 0.7  Alkaline Phos 39 - 117 U/L 54 68 65  AST 0 -  37 U/L _2 ALT 0 - 53 U/L _3 Lipid Panel     Component Value Date/Time   CHOL 162 06/16/2017 1717   TRIG 128.0 06/16/2017 1717   HDL 47.90 06/16/2017 1717   CHOLHDL 3 06/16/2017 1717   VLDL 25.6 06/16/2017 1717   LDLCALC 89 06/16/2017 1717    No components found for: NTPROBNP No results for input(s): PROBNP in the last 8760 hours. No results for input(s): TSH in the last 8760 hours.  BMP No results for input(s): NA, K, CL, CO2, GLUCOSE, BUN, CREATININE, CALCIUM, GFRNONAA, GFRAA in the last 8760 hours.  HEMOGLOBIN A1C Lab Results  Component Value Date   HGBA1C 5.3 06/16/2017   External Labs: Collected: 09/12/2020 Creatinine 1.2 mg/dL. eGFR: 74 mL/min per 1.73 m Sodium 140, potassium 4.1, chloride 105, bicarb 23, AST 17, ALT 18, alkaline phosphatase 91 Hemoglobin 17 g/dL, hematocrit 47.6% Lipid profile: Total cholesterol 149, triglycerides 81, HDL 39, LDL 94 TSH: 1.02  IMPRESSION:    ICD-10-CM   1. Precordial pain  R07.2 EKG 12-Lead    PCV ECHOCARDIOGRAM COMPLETE    CT CORONARY MORPH W/CTA COR W/SCORE W/CA W/CM &/OR WO/CM    CT CORONARY FRACTIONAL FLOW RESERVE DATA PREP    CT CORONARY FRACTIONAL FLOW RESERVE FLUID ANALYSIS    metoprolol tartrate (LOPRESSOR) 50 MG tablet  2. Other form of dyspnea  R06.09 PCV ECHOCARDIOGRAM COMPLETE    CT CORONARY MORPH W/CTA COR W/SCORE W/CA W/CM &/OR WO/CM    CT CORONARY FRACTIONAL FLOW RESERVE DATA PREP    CT CORONARY FRACTIONAL FLOW RESERVE FLUID ANALYSIS  3. Former smoker  Z87.891   4. Benign hypertension  I10 metoprolol tartrate (LOPRESSOR) 50 MG tablet  5. Family history of premature CAD  Z82.49      RECOMMENDATIONS: Joshua Stephenson is a 65 y.o. male whose past medical history and cardiac risk factors include: Benign essential hypertension, former smoker (69-pack-years hx of smoking), COPD, family history of premature CAD.  Precordial pain:  The patient has symptoms of chest pain has both typical and  surgical risk.  No active chest pain during today's office evaluation.  EKG shows normal sinus rhythm without injury pattern.  Echocardiogram will be ordered to evaluate for structural heart disease and left ventricular systolic function.  Given the patient's symptoms, family history of premature CAD, recommend undergoing coronary CTA to evaluate for coronary artery calcification and CAD.  We will start Lopressor 50 mg p.o. twice daily prior to the anticipation of coronary CTA.  Outside labs reviewed and results noted above.  Dyspnea on exertion:  Ischemic evaluation as discussed above.  Recommend the patient to discuss seeing a pulmonologist with his PCP given his 69-year pack history of smoking as he may have underlying undiagnosed COPD and emphysema.  He is currently not on any inhalers.  Benign essential hypertension:  Currently not on any pharmacological therapy.  Low salt diet recommended.   Patient is asked to keep a log of his blood pressures and to review it with his primary care provider to see if additional medication is warranted.  Former smoker: Educated on the importance of continued smoking cessation.  Patient is encouraged to get vaccinated for COVID-19.   FINAL MEDICATION LIST END OF ENCOUNTER: Meds ordered this encounter  Medications  . metoprolol tartrate (LOPRESSOR) 50 MG tablet    Sig: Take 1 tablet (50 mg total) by mouth 2 (two) times daily. Hold if systolic blood pressure (top number) less than 100 mmHg or heart rate less than 60 bpm (pulse).    Dispense:  180 tablet    Refill:  0     Current Outpatient Medications:  .  metoprolol tartrate (LOPRESSOR) 50 MG tablet, Take 1 tablet (50 mg total) by mouth 2 (two) times daily. Hold if systolic blood pressure (top number) less than 100 mmHg or heart rate less than 60 bpm (pulse)., Disp: 180 tablet, Rfl: 0  Orders Placed This Encounter  Procedures  . CT CORONARY MORPH W/CTA COR W/SCORE W/CA W/CM &/OR  WO/CM  . CT CORONARY FRACTIONAL FLOW RESERVE DATA PREP  . CT CORONARY FRACTIONAL FLOW RESERVE FLUID ANALYSIS  . EKG 12-Lead  . PCV ECHOCARDIOGRAM COMPLETE    There are no Patient Instructions on file for this visit.   --Continue cardiac medications as reconciled in final medication list. --Return in about 4 weeks (around 10/29/2020) for Chest pain, Dyspnea, Review test results. Or sooner if needed. --Continue follow-up with your primary care physician regarding the management of your other chronic comorbid conditions.  Patient's  questions and concerns were addressed to his satisfaction. He voices understanding of the instructions provided during this encounter.   This note was created using a voice recognition software as a result there may be grammatical errors inadvertently enclosed that do not reflect the nature of this encounter. Every attempt is made to correct such errors.  Rex Kras, [place], [facility]  Pager: [phone] Office: [phone]

## 2020-10-10 ENCOUNTER — Other Ambulatory Visit: Payer: Self-pay

## 2020-10-10 ENCOUNTER — Ambulatory Visit: Payer: Medicare HMO

## 2020-10-10 DIAGNOSIS — R072 Precordial pain: Secondary | ICD-10-CM

## 2020-10-10 DIAGNOSIS — R0609 Other forms of dyspnea: Secondary | ICD-10-CM

## 2020-10-11 ENCOUNTER — Other Ambulatory Visit: Payer: Self-pay | Admitting: Cardiology

## 2020-10-11 DIAGNOSIS — R072 Precordial pain: Secondary | ICD-10-CM

## 2020-10-18 DIAGNOSIS — R072 Precordial pain: Secondary | ICD-10-CM | POA: Diagnosis not present

## 2020-10-19 LAB — BASIC METABOLIC PANEL
BUN/Creatinine Ratio: 10 (ref 10–24)
BUN: 13 mg/dL (ref 8–27)
CO2: 22 mmol/L (ref 20–29)
Calcium: 9.8 mg/dL (ref 8.6–10.2)
Chloride: 99 mmol/L (ref 96–106)
Creatinine, Ser: 1.24 mg/dL (ref 0.76–1.27)
GFR calc Af Amer: 72 mL/min/{1.73_m2} (ref >59)
GFR calc non Af Amer: 62 mL/min/{1.73_m2} (ref >59)
Glucose: 153 mg/dL — ABNORMAL HIGH (ref 65–99)
Potassium: 4.1 mmol/L (ref 3.5–5.2)
Sodium: 142 mmol/L (ref 134–144)

## 2020-10-22 ENCOUNTER — Telehealth (HOSPITAL_COMMUNITY): Payer: Self-pay | Admitting: Emergency Medicine

## 2020-10-22 NOTE — Telephone Encounter (Signed)
Reaching out to patient to offer assistance regarding upcoming cardiac imaging study; pt verbalizes understanding of appt date/time, parking situation and where to check in, pre-test NPO status and medications ordered, and verified current allergies; name and call back number provided for further questions should they arise Joshua Bond RN Navigator Cardiac Imaging [facility] [phone] office [phone] cell  Pt to take dose metop 2h pta Clarise Cruz

## 2020-10-23 ENCOUNTER — Ambulatory Visit (HOSPITAL_COMMUNITY)
Admission: RE | Admit: 2020-10-23 | Discharge: 2020-10-23 | Disposition: A | Discharge status: Home / Self Care | Payer: Medicare HMO | Source: Ambulatory Visit | Attending: Cardiology | Admitting: Cardiology

## 2020-10-23 ENCOUNTER — Other Ambulatory Visit: Payer: Self-pay

## 2020-10-23 ENCOUNTER — Encounter (HOSPITAL_COMMUNITY): Payer: Self-pay

## 2020-10-23 ENCOUNTER — Encounter: Payer: Medicare HMO | Admitting: *Deleted

## 2020-10-23 DIAGNOSIS — R0609 Other forms of dyspnea: Secondary | ICD-10-CM | POA: Insufficient documentation

## 2020-10-23 DIAGNOSIS — R072 Precordial pain: Secondary | ICD-10-CM | POA: Diagnosis not present

## 2020-10-23 DIAGNOSIS — Z006 Encounter for examination for normal comparison and control in clinical research program: Secondary | ICD-10-CM

## 2020-10-23 MED ORDER — IOHEXOL 350 MG/ML SOLN
80.0000 mL | Freq: Once | INTRAVENOUS | Status: AC | PRN
  Administered 2020-10-23: 80 mL via INTRAVENOUS

## 2020-10-23 MED ORDER — NITROGLYCERIN 0.4 MG SL SUBL
0.8000 mg | SUBLINGUAL_TABLET | Freq: Once | SUBLINGUAL | Status: AC
  Administered 2020-10-23: 0.8 mg via SUBLINGUAL

## 2020-10-23 MED ORDER — NITROGLYCERIN 0.4 MG SL SUBL
SUBLINGUAL_TABLET | SUBLINGUAL | Status: AC
  Filled 2020-10-23: qty 2

## 2020-10-23 NOTE — Research (Signed)
Subject Name: Joshua Stephenson  Subject met inclusion and exclusion criteria.  The informed consent form, study requirements and expectations were reviewed with the subject and questions and concerns were addressed prior to the signing of the consent form.  The subject verbalized understanding of the trial requirements.  The subject agreed to participate in the Identify  trial and signed the informed consent at 0758 on 10/23/20  The informed consent was obtained prior to performance of any protocol-specific procedures for the subject.  A copy of the signed informed consent was given to the subject and a copy was placed in the subject's medical record.   [name]

## 2020-10-28 ENCOUNTER — Other Ambulatory Visit: Payer: Self-pay

## 2020-10-28 ENCOUNTER — Ambulatory Visit: Payer: Medicare HMO | Admitting: Cardiology

## 2020-10-28 ENCOUNTER — Encounter: Payer: Self-pay | Admitting: Cardiology

## 2020-10-28 VITALS — BP 159/77 | HR 80 | Temp 98.3°F | Resp 17 | Ht 67.0 in | Wt 186.2 lb

## 2020-10-28 DIAGNOSIS — R072 Precordial pain: Secondary | ICD-10-CM

## 2020-10-28 DIAGNOSIS — R0609 Other forms of dyspnea: Secondary | ICD-10-CM | POA: Diagnosis not present

## 2020-10-28 DIAGNOSIS — Z712 Person consulting for explanation of examination or test findings: Secondary | ICD-10-CM | POA: Diagnosis not present

## 2020-10-28 DIAGNOSIS — Z87891 Personal history of nicotine dependence: Secondary | ICD-10-CM

## 2020-10-28 DIAGNOSIS — I2584 Coronary atherosclerosis due to calcified coronary lesion: Secondary | ICD-10-CM

## 2020-10-28 DIAGNOSIS — I2510 Atherosclerotic heart disease of native coronary artery without angina pectoris: Secondary | ICD-10-CM | POA: Diagnosis not present

## 2020-10-28 DIAGNOSIS — I10 Essential (primary) hypertension: Secondary | ICD-10-CM

## 2020-10-28 DIAGNOSIS — R0600 Dyspnea, unspecified: Secondary | ICD-10-CM | POA: Insufficient documentation

## 2020-10-28 DIAGNOSIS — Z8249 Family history of ischemic heart disease and other diseases of the circulatory system: Secondary | ICD-10-CM | POA: Diagnosis not present

## 2020-10-28 MED ORDER — HYDROCHLOROTHIAZIDE 25 MG PO TABS
25.0000 mg | ORAL_TABLET | Freq: Every morning | ORAL | 0 refills | Status: DC
Start: 2020-10-28 — End: 2021-01-20

## 2020-10-28 MED ORDER — ATORVASTATIN CALCIUM 20 MG PO TABS: 20.0000 mg | ORAL_TABLET | Freq: Every evening | ORAL | 0 refills | Status: DC

## 2020-10-28 MED ORDER — METOPROLOL SUCCINATE ER 100 MG PO TB24: 100.0000 mg | ORAL_TABLET | Freq: Every day | ORAL | 0 refills | Status: DC

## 2020-10-28 MED ORDER — ASPIRIN EC 81 MG PO TBEC: 81.0000 mg | DELAYED_RELEASE_TABLET | Freq: Every day | ORAL | 3 refills | Status: DC

## 2020-10-28 NOTE — Progress Notes (Signed)
Date:  10/28/2020   ID:  Joshua Stephenson, DOB 03-27-1956, MRN [id number]  PCP:  Sueanne Margarita, DO  Cardiologist:  Rex Kras, DO, [facility] (established care October 01, 2020)  Date: 10/28/20 Last Office Visit: 10/01/2020  Chief Complaint  Patient presents with  . Chest Pain  . Shortness of Breath  . Results    4 WEEKS    HPI  Joshua Stephenson is a 65 y.o. male who presents to the office with a chief complaint of " reevaluation of chest pain and review test results." Patient's past medical history and cardiovascular risk factors include: Benign essential hypertension, former smoker (69-pack-years hx of smoking), COPD, family history of premature CAD, mild coronary artery calcification.  He is referred to the office at the request of Sueanne Margarita, DO for evaluation of chest pain.  At the last office visit patient's symptoms of precordial pain was suggestive of possibly cardiac in the etiology.  Given his multiple cardiovascular risk factors shared decision was to proceed with an echocardiogram and coronary CTA.  Since last office visit patient stated that the chest pain has improved.  The symptoms are less frequent.  More noticeable while he is at rest when he notices his pulse is elevated.  No improving or worsening factors.  Not brought on by effort related activities and does not resolve with resting.  Pain is usually self-limited and lasts for a few seconds.  Since last visit echocardiogram notes preserved LVEF without any significant valvular heart disease.  And coronary CTA shows minimal atherosclerotic burden with mild coronary artery calcification.  Patient's vessels are tortuous suggestive of hypertensive heart disease and echocardiogram also notes left ventricular hypertrophy suggestive of possible longstanding blood pressure.  Patient states that his home blood pressures are very labile.  They are usually higher in the mornings as opposed to the evening hours.  His  morning blood pressures are usually around 160 mmHg.  His dyspnea on exertion continues to be present but stable.  I suspect the degree of his dyspnea on exertion secondary pulmonary etiology given his 69-pack-year history of smoking.  Patient states that he used to be on inhalers for COPD but no longer uses them.  Not vaccinated for COVID 19 infection.   FUNCTIONAL STATUS: No structured exercise program or daily routine.   ALLERGIES: No Known Allergies  MEDICATION LIST PRIOR TO VISIT: Current Meds  Medication Sig  . aspirin EC 81 MG tablet Take 1 tablet (81 mg total) by mouth daily. Swallow whole.  Marland Kitchen atorvastatin (LIPITOR) 20 MG tablet Take 1 tablet (20 mg total) by mouth at bedtime.  . hydrochlorothiazide (HYDRODIURIL) 25 MG tablet Take 1 tablet (25 mg total) by mouth in the morning.  . metoprolol succinate (TOPROL-XL) 100 MG 24 hr tablet Take 1 tablet (100 mg total) by mouth daily. Take with or immediately following a meal.  . [DISCONTINUED] metoprolol tartrate (LOPRESSOR) 50 MG tablet Take 1 tablet (50 mg total) by mouth 2 (two) times daily. Hold if systolic blood pressure (top number) less than 100 mmHg or heart rate less than 60 bpm (pulse).     PAST MEDICAL HISTORY: Past Medical History:  Diagnosis Date  . Anxiety   . Asthma    as a child  . COPD, mild ([place]) 06/07/2014  . Depression   . GERD (gastroesophageal reflux disease)   . Hemorrhoids     PAST SURGICAL HISTORY: Past Surgical History:  Procedure Laterality Date  . COLONOSCOPY  2011  . SHOULDER  ARTHROSCOPY W/ ROTATOR CUFF REPAIR Right 02/08/2018    FAMILY HISTORY: The patient family history includes Breast cancer in his mother; Cancer in his brother, brother, and sister; Diabetes in his father; Heart disease in his father; Hypertension in his father and mother.  SOCIAL HISTORY:  The patient  reports that he quit smoking about 2 years ago. His smoking use included cigarettes. He has a 20.00 pack-year smoking  history. He has never used smokeless tobacco. He reports previous alcohol use. He reports that he does not use drugs.  REVIEW OF SYSTEMS: Review of Systems  Constitutional: Negative for chills and fever.  HENT: Negative for hoarse voice and nosebleeds.   Eyes: Negative for discharge, double vision and pain.  Cardiovascular: Positive for chest pain (improved) and dyspnea on exertion (stable ). Negative for claudication, leg swelling, near-syncope, orthopnea, palpitations, paroxysmal nocturnal dyspnea and syncope.  Respiratory: Positive for shortness of breath (stable. ). Negative for hemoptysis.   Musculoskeletal: Negative for muscle cramps and myalgias.  Gastrointestinal: Negative for abdominal pain, constipation, diarrhea, hematemesis, hematochezia, melena, nausea and vomiting.  Neurological: Negative for dizziness and light-headedness.    PHYSICAL EXAM: Vitals with BMI 10/28/2020 10/23/2020 10/23/2020  Height 5' 7"  - -  Weight 186 lbs 3 oz - -  BMI 09.29 - -  Systolic 574 734 037  Diastolic 77 75 77  Pulse 80 66 68   CONSTITUTIONAL: Well-developed and well-nourished. No acute distress.  SKIN: Skin is warm and dry. No rash noted. No cyanosis. No pallor. No jaundice HEAD: Normocephalic and atraumatic.  EYES: No scleral icterus MOUTH/THROAT: Moist oral membranes.  NECK: No JVD present. No thyromegaly noted. No carotid bruits  LYMPHATIC: No visible cervical adenopathy.  CHEST Normal respiratory effort. No intercostal retractions  LUNGS: Decreased breath sounds bilaterally, No stridor. No wheezes. No rales.  CARDIOVASCULAR: Regular rate and rhythm, positive S1-S2, no murmurs rubs or gallops appreciated. ABDOMINAL: No apparent ascites.  EXTREMITIES: No peripheral edema  HEMATOLOGIC: No significant bruising NEUROLOGIC: Oriented to person, place, and time. Nonfocal. Normal muscle tone.  PSYCHIATRIC: Normal mood and affect. Normal behavior. Cooperative  CARDIAC DATABASE: EKG: 10/01/2020:  Normal sinus rhythm, 79 bpm, left axis deviation, consider old anteroseptal infarct, without underlying ischemia or injury pattern.   Echocardiogram: 10/10/2020: Normal LV systolic function with visual EF 55-60%. Left ventricle cavity is normal in size. Moderate left ventricular hypertrophy. Normal global wall motion. No significant valvular heart disease. No prior study for comparison   Stress Testing: No results found for this or any previous visit from the past 1095 days.  CCTA:  10/23/2020:  Total calcium score: 19 AU, which places the patient in the 63th percentile for age and sex matched control. Normal coronary origin with right dominance. CAD-RADS = 1. Minimal stenosis in distal LAD, Minimal stenosis in ostial LCX. Aortic atherosclerosis.  Heart Catheterization: None  LABORATORY DATA: CBC Latest Ref Rng & Units 04/04/2018 10/06/2017 06/16/2017  WBC 4.0 - 10.5 K/uL 6.4 4.9 7.1  Hemoglobin 13.0 - 17.0 g/dL 14.2 13.8 13.9  Hematocrit 39.0 - 52.0 % 40.5 39.6 40.4  Platelets 150.0 - 400.0 K/uL 279.0 236.0 146.0 Result may be falsely decreased due to platelet clumping.(L)    CMP Latest Ref Rng & Units 10/18/2020 10/06/2017 06/16/2017  Glucose 65 - 99 mg/dL 153(H) 89 81  BUN 8 - 27 mg/dL 13 20 13   Creatinine 0.76 - 1.27 mg/dL 1.24 0.96 0.98  Sodium 134 - 144 mmol/L 142 141 140  Potassium 3.5 - 5.2 mmol/L 4.1  4.1 4.1  Chloride 96 - 106 mmol/L 99 105 107  CO2 20 - 29 mmol/L 22 30 27   Calcium 8.6 - 10.2 mg/dL 9.8 9.2 9.6  Total Protein 6.0 - 8.3 g/dL - 6.8 7.1  Total Bilirubin 0.2 - 1.2 mg/dL - 0.6 0.5  Alkaline Phos 39 - 117 U/L - 54 68  AST 0 - 37 U/L - 16 20  ALT 0 - 53 U/L - 9 18    Lipid Panel     Component Value Date/Time   CHOL 162 06/16/2017 1717   TRIG 128.0 06/16/2017 1717   HDL 47.90 06/16/2017 1717   CHOLHDL 3 06/16/2017 1717   VLDL 25.6 06/16/2017 1717   LDLCALC 89 06/16/2017 1717    No components found for: NTPROBNP No results for input(s): PROBNP in  the last 8760 hours. No results for input(s): TSH in the last 8760 hours.  BMP Recent Labs    10/18/20 0919  NA 142  K 4.1  CL 99  CO2 22  GLUCOSE 153*  BUN 13  CREATININE 1.24  CALCIUM 9.8  GFRNONAA 62  GFRAA 72    HEMOGLOBIN A1C Lab Results  Component Value Date   HGBA1C 5.3 06/16/2017   External Labs: Collected: 09/12/2020 Creatinine 1.2 mg/dL. eGFR: 74 mL/min per 1.73 m Sodium 140, potassium 4.1, chloride 105, bicarb 23, AST 17, ALT 18, alkaline phosphatase 91 Hemoglobin 17 g/dL, hematocrit 47.6% Lipid profile: Total cholesterol 149, triglycerides 81, HDL 39, LDL 94 TSH: 1.02  IMPRESSION:    ICD-10-CM   1. Precordial pain  R07.2 EKG 12-Lead    metoprolol succinate (TOPROL-XL) 100 MG 24 hr tablet    hydrochlorothiazide (HYDRODIURIL) 25 MG tablet  2. Other form of dyspnea  R06.09 hydrochlorothiazide (HYDRODIURIL) 25 MG tablet  3. Former smoker  Z87.891   4. Benign hypertension  K44 Basic metabolic panel    Magnesium  5. Family history of premature CAD  Z82.49 atorvastatin (LIPITOR) 20 MG tablet  6. Encounter to discuss test results  Z71.2   7. Coronary atherosclerosis due to calcified coronary lesion  I25.10 atorvastatin (LIPITOR) 20 MG tablet   I25.84 aspirin EC 81 MG tablet     RECOMMENDATIONS: Harve Spradley is a 65 y.o. male whose past medical history and cardiac risk factors include: Benign essential hypertension, former smoker (69-pack-years hx of smoking), COPD, family history of premature CAD.  Precordial pain:  During last office visit his symptoms were suggestive of possible cardiac etiology and therefore underwent an ischemic evaluation.  His coronary CTA shows minimal CAD and mild coronary artery calcification.  Recommend up titration of guideline directed medical therapy as he is medication nave.    Will transition him from Lopressor to Toprol-XL 100 mg p.o. daily.    Given his uncontrolled hypertension we will start  hydrochlorothiazide 25 mg p.o. every morning.  Blood work in 1 week to evaluate kidney function and electrolytes.  Given the mild coronary artery calcification, 10-year estimated risk of ASCVD is 12.7% recommend aspirin and statin therapy.  Dyspnea on exertion:  Most likely secondary to noncardiac etiology.  Given his extensive history of smoking recommend discussing with his PCP for possible pulmonary evaluation.  Of note, patient used to be on inhalers several years ago.    Benign essential hypertension:  Not well controlled.  Patient states that his home blood pressures in the morning are elevated compared to evening numbers.  Start hydrochlorothiazide as discussed above.  Patient may benefit from sleep study.  He  is asked to discuss it further with PCP.  Low salt diet recommended.   Patient is asked to keep a log of his blood pressures and to review it with his primary care provider to see if additional medication is warranted.  Former smoker: Educated on the importance of continued smoking cessation.  Patient is encouraged to get vaccinated for COVID-19.   FINAL MEDICATION LIST END OF ENCOUNTER: Meds ordered this encounter  Medications  . metoprolol succinate (TOPROL-XL) 100 MG 24 hr tablet    Sig: Take 1 tablet (100 mg total) by mouth daily. Take with or immediately following a meal.    Dispense:  90 tablet    Refill:  0  . hydrochlorothiazide (HYDRODIURIL) 25 MG tablet    Sig: Take 1 tablet (25 mg total) by mouth in the morning.    Dispense:  90 tablet    Refill:  0  . atorvastatin (LIPITOR) 20 MG tablet    Sig: Take 1 tablet (20 mg total) by mouth at bedtime.    Dispense:  90 tablet    Refill:  0  . aspirin EC 81 MG tablet    Sig: Take 1 tablet (81 mg total) by mouth daily. Swallow whole.    Dispense:  90 tablet    Refill:  3     Current Outpatient Medications:  .  aspirin EC 81 MG tablet, Take 1 tablet (81 mg total) by mouth daily. Swallow whole., Disp:  90 tablet, Rfl: 3 .  atorvastatin (LIPITOR) 20 MG tablet, Take 1 tablet (20 mg total) by mouth at bedtime., Disp: 90 tablet, Rfl: 0 .  hydrochlorothiazide (HYDRODIURIL) 25 MG tablet, Take 1 tablet (25 mg total) by mouth in the morning., Disp: 90 tablet, Rfl: 0 .  metoprolol succinate (TOPROL-XL) 100 MG 24 hr tablet, Take 1 tablet (100 mg total) by mouth daily. Take with or immediately following a meal., Disp: 90 tablet, Rfl: 0  Orders Placed This Encounter  Procedures  . Basic metabolic panel  . Magnesium  . EKG 12-Lead    There are no Patient Instructions on file for this visit.   --Continue cardiac medications as reconciled in final medication list. --Return in about 4 weeks (around 11/25/2020) for Follow up, Dyspnea. Or sooner if needed. --Continue follow-up with your primary care physician regarding the management of your other chronic comorbid conditions.  Patient's questions and concerns were addressed to his satisfaction. He voices understanding of the instructions provided during this encounter.   This note was created using a voice recognition software as a result there may be grammatical errors inadvertently enclosed that do not reflect the nature of this encounter. Every attempt is made to correct such errors.  Rex Kras, [place], [facility]  Pager: [phone] Office: [phone]

## 2020-10-29 ENCOUNTER — Other Ambulatory Visit: Payer: Self-pay

## 2020-10-29 DIAGNOSIS — I10 Essential (primary) hypertension: Secondary | ICD-10-CM

## 2020-11-05 DIAGNOSIS — I10 Essential (primary) hypertension: Secondary | ICD-10-CM | POA: Diagnosis not present

## 2020-11-06 LAB — MAGNESIUM: Magnesium: 2 mg/dL (ref 1.6–2.3)

## 2020-11-06 LAB — BASIC METABOLIC PANEL
BUN/Creatinine Ratio: 8 — ABNORMAL LOW (ref 10–24)
BUN: 10 mg/dL (ref 8–27)
CO2: 23 mmol/L (ref 20–29)
Calcium: 9.9 mg/dL (ref 8.6–10.2)
Chloride: 103 mmol/L (ref 96–106)
Creatinine, Ser: 1.25 mg/dL (ref 0.76–1.27)
GFR calc Af Amer: 71 mL/min/{1.73_m2} (ref >59)
GFR calc non Af Amer: 61 mL/min/{1.73_m2} (ref >59)
Glucose: 78 mg/dL (ref 65–99)
Potassium: 4 mmol/L (ref 3.5–5.2)
Sodium: 140 mmol/L (ref 134–144)

## 2020-11-26 ENCOUNTER — Ambulatory Visit: Payer: Medicare HMO | Admitting: Cardiology

## 2020-12-05 ENCOUNTER — Encounter: Payer: Self-pay | Admitting: Cardiology

## 2020-12-05 ENCOUNTER — Ambulatory Visit: Payer: Medicare HMO | Admitting: Cardiology

## 2020-12-05 ENCOUNTER — Other Ambulatory Visit: Payer: Self-pay

## 2020-12-05 VITALS — BP 128/80 | HR 76 | Temp 97.8°F | Resp 16 | Ht 67.0 in | Wt 181.0 lb

## 2020-12-05 DIAGNOSIS — Z87891 Personal history of nicotine dependence: Secondary | ICD-10-CM

## 2020-12-05 DIAGNOSIS — Z8249 Family history of ischemic heart disease and other diseases of the circulatory system: Secondary | ICD-10-CM | POA: Diagnosis not present

## 2020-12-05 DIAGNOSIS — R072 Precordial pain: Secondary | ICD-10-CM

## 2020-12-05 DIAGNOSIS — R0609 Other forms of dyspnea: Secondary | ICD-10-CM

## 2020-12-05 DIAGNOSIS — I10 Essential (primary) hypertension: Secondary | ICD-10-CM | POA: Diagnosis not present

## 2020-12-05 DIAGNOSIS — R0600 Dyspnea, unspecified: Secondary | ICD-10-CM

## 2020-12-05 NOTE — Progress Notes (Signed)
Date:  12/05/2020   ID:  [name], DOB 08-08-1956, MRN [id number]  PCP:  Sueanne Margarita, DO  Cardiologist:  Rex Kras, DO, [facility] (established care October 01, 2020)  Date: 12/05/20 Last Office Visit: 10/28/2020  Chief Complaint  Patient presents with  . Follow-up    Recent medication changes.  . Shortness of Breath    HPI  Joshua Stephenson is a 65 y.o. male who presents to the office with a chief complaint of " reevaluation of shortness of breath and discuss recent medication changes."  Patient's past medical history and cardiovascular risk factors include: Benign essential hypertension, former smoker (69-pack-years hx of smoking), COPD, family history of premature CAD, mild coronary artery calcification.  He is referred to the office at the request of Sueanne Margarita, DO for evaluation of chest pain.  Since establishing care patient has undergone extensive cardiovascular evaluation including an echocardiogram and coronary CTA.  Findings noted below for further reference.  He presents today for 1 month follow-up to reevaluate chest pain, dyspnea, and recent initiation of medications including aspirin, statin therapy, hydrochlorothiazide, and transition of Lopressor to Toprol-XL.  Since last office visit patient is doing well from a cardiovascular standpoint.  No hospitalizations or urgent care visits for cardiovascular symptoms.  Given the coronary artery calcification and elevated 10-year risk of ASCVD patient was encouraged to start aspirin 81 mg p.o. daily and statin therapy at last visit.  He is tolerated both of the medications well without any side effects or intolerances.  At the last visit we transitioned him from Lopressor to Toprol-XL which he tolerated well.  Given his dyspnea on exertion we also uptitrated his antihypertensive medications.  He was started on hydrochlorothiazide 25 mg p.o. every morning.  Patient tolerated HCTZ well.  Patient did bring in  his blood pressure log for review.  His systolic blood pressures in the morning have now trended to 782-956 mmHg and diastolic blood pressures are between 60-85 mmHg.  Pulse is greater than 60 bpm.  And similar results are noted in the evening hours as well.  Independently reviewed labs from 11/05/2020 which are within normal limits.  Patient states that his effort related dyspnea has improved significantly since last visit.  He denies any chest pain or anginal equivalent.  He has not had a chance to discuss with his primary care provider regarding sleep study evaluation and pulmonary consult given his history of smoking I suspect that he may have underlying organic disease contributing to his dyspnea as well.  FUNCTIONAL STATUS: No structured exercise program or daily routine.   ALLERGIES: No Known Allergies  MEDICATION LIST PRIOR TO VISIT: Current Meds  Medication Sig  . aspirin EC 81 MG tablet Take 1 tablet (81 mg total) by mouth daily. Swallow whole.  Marland Kitchen atorvastatin (LIPITOR) 20 MG tablet Take 1 tablet (20 mg total) by mouth at bedtime.  . hydrochlorothiazide (HYDRODIURIL) 25 MG tablet Take 1 tablet (25 mg total) by mouth in the morning.  . metoprolol succinate (TOPROL-XL) 100 MG 24 hr tablet Take 1 tablet (100 mg total) by mouth daily. Take with or immediately following a meal.     PAST MEDICAL HISTORY: Past Medical History:  Diagnosis Date  . Anxiety   . Asthma    as a child  . COPD, mild ([place]) 06/07/2014  . Depression   . GERD (gastroesophageal reflux disease)   . Hemorrhoids     PAST SURGICAL HISTORY: Past Surgical History:  Procedure Laterality Date  .  COLONOSCOPY  2011  . SHOULDER ARTHROSCOPY W/ ROTATOR CUFF REPAIR Right 02/08/2018    FAMILY HISTORY: The patient family history includes Breast cancer in his mother; Cancer in his brother, brother, and sister; Diabetes in his father; Heart disease in his father; Hypertension in his father and mother.  SOCIAL HISTORY:   The patient  reports that he quit smoking about 2 years ago. His smoking use included cigarettes. He has a 20.00 pack-year smoking history. He has never used smokeless tobacco. He reports previous alcohol use. He reports that he does not use drugs.  REVIEW OF SYSTEMS: Review of Systems  Constitutional: Negative for chills and fever.  HENT: Negative for hoarse voice and nosebleeds.   Eyes: Negative for discharge, double vision and pain.  Cardiovascular: Negative for chest pain, claudication, dyspnea on exertion, leg swelling, near-syncope, orthopnea, palpitations, paroxysmal nocturnal dyspnea and syncope.  Respiratory: Negative for hemoptysis and shortness of breath.   Musculoskeletal: Negative for muscle cramps and myalgias.  Gastrointestinal: Negative for abdominal pain, constipation, diarrhea, hematemesis, hematochezia, melena, nausea and vomiting.  Neurological: Negative for dizziness and light-headedness.    PHYSICAL EXAM: Vitals with BMI 12/05/2020 10/28/2020 10/23/2020  Height 5' 7"  5' 7"  -  Weight 181 lbs 186 lbs 3 oz -  BMI 77.93 90.30 -  Systolic 092 330 076  Diastolic 80 77 75  Pulse 76 80 66   CONSTITUTIONAL: Well-developed and well-nourished. No acute distress.  SKIN: Skin is warm and dry. No rash noted. No cyanosis. No pallor. No jaundice HEAD: Normocephalic and atraumatic.  EYES: No scleral icterus MOUTH/THROAT: Moist oral membranes.  NECK: No JVD present. No thyromegaly noted. No carotid bruits  LYMPHATIC: No visible cervical adenopathy.  CHEST Normal respiratory effort. No intercostal retractions  LUNGS: Decreased breath sounds bilaterally, No stridor. No wheezes. No rales.  CARDIOVASCULAR: Regular rate and rhythm, positive S1-S2, no murmurs rubs or gallops appreciated. ABDOMINAL: No apparent ascites.  EXTREMITIES: No peripheral edema  HEMATOLOGIC: No significant bruising NEUROLOGIC: Oriented to person, place, and time. Nonfocal. Normal muscle tone.  PSYCHIATRIC:  Normal mood and affect. Normal behavior. Cooperative  CARDIAC DATABASE: EKG: 10/01/2020: Normal sinus rhythm, 79 bpm, left axis deviation, consider old anteroseptal infarct, without underlying ischemia or injury pattern.   Echocardiogram: 10/10/2020: Normal LV systolic function with visual EF 55-60%. Left ventricle cavity is normal in size. Moderate left ventricular hypertrophy. Normal global wall motion. No significant valvular heart disease. No prior study for comparison   Stress Testing: No results found for this or any previous visit from the past 1095 days.  CCTA:  10/23/2020:  Total calcium score: 19 AU, which places the patient in the 63th percentile for age and sex matched control. Normal coronary origin with right dominance. CAD-RADS = 1. Minimal stenosis in distal LAD, Minimal stenosis in ostial LCX. Aortic atherosclerosis.  Heart Catheterization: None  LABORATORY DATA: CBC Latest Ref Rng & Units 04/04/2018 10/06/2017 06/16/2017  WBC 4.0 - 10.5 K/uL 6.4 4.9 7.1  Hemoglobin 13.0 - 17.0 g/dL 14.2 13.8 13.9  Hematocrit 39.0 - 52.0 % 40.5 39.6 40.4  Platelets 150.0 - 400.0 K/uL 279.0 236.0 146.0 Result may be falsely decreased due to platelet clumping.(L)    CMP Latest Ref Rng & Units 11/05/2020 10/18/2020 10/06/2017  Glucose 65 - 99 mg/dL 78 153(H) 89  BUN 8 - 27 mg/dL 10 13 20   Creatinine 0.76 - 1.27 mg/dL 1.25 1.24 0.96  Sodium 134 - 144 mmol/L 140 142 141  Potassium 3.5 - 5.2 mmol/L 4.0 4.1  4.1  Chloride 96 - 106 mmol/L 103 99 105  CO2 20 - 29 mmol/L 23 22 30   Calcium 8.6 - 10.2 mg/dL 9.9 9.8 9.2  Total Protein 6.0 - 8.3 g/dL - - 6.8  Total Bilirubin 0.2 - 1.2 mg/dL - - 0.6  Alkaline Phos 39 - 117 U/L - - 54  AST 0 - 37 U/L - - 16  ALT 0 - 53 U/L - - 9    Lipid Panel     Component Value Date/Time   CHOL 162 06/16/2017 1717   TRIG 128.0 06/16/2017 1717   HDL 47.90 06/16/2017 1717   CHOLHDL 3 06/16/2017 1717   VLDL 25.6 06/16/2017 1717   LDLCALC 89  06/16/2017 1717    No components found for: NTPROBNP No results for input(s): PROBNP in the last 8760 hours. No results for input(s): TSH in the last 8760 hours.  BMP Recent Labs    10/18/20 0919 11/05/20 1147  NA 142 140  K 4.1 4.0  CL 99 103  CO2 22 23  GLUCOSE 153* 78  BUN 13 10  CREATININE 1.24 1.25  CALCIUM 9.8 9.9  GFRNONAA 62 61  GFRAA 72 71    HEMOGLOBIN A1C Lab Results  Component Value Date   HGBA1C 5.3 06/16/2017   External Labs: Collected: 09/12/2020 Creatinine 1.2 mg/dL. eGFR: 74 mL/min per 1.73 m Sodium 140, potassium 4.1, chloride 105, bicarb 23, AST 17, ALT 18, alkaline phosphatase 91 Hemoglobin 17 g/dL, hematocrit 47.6% Lipid profile: Total cholesterol 149, triglycerides 81, HDL 39, LDL 94 TSH: 1.02  IMPRESSION:    ICD-10-CM   1. Dyspnea on exertion  R06.00   2. Precordial pain  R07.2   3. Benign hypertension  I10   4. Former smoker  Z87.891   5. Family history of premature CAD  Z82.49      RECOMMENDATIONS: Daxter Paule is a 65 y.o. male whose past medical history and cardiac risk factors include: Benign essential hypertension, former smoker (69-pack-years hx of smoking), COPD, family history of premature CAD.  Dyspnea on exertion: Improving  Up titration of antihypertensive medications have helped, per patient.  Encouraged him to follow-up with pulmonary medicine as he may have underlying COPD given his prolonged history of smoking.  Should also be evaluated for sleep apnea.  Ischemic evaluation as noted above.  No additional testing needed at this time.  Precordial pain:  Resolved since last office visit.  Tolerated the transition from Lopressor to Toprol-XL  Despite mild coronary artery calcification the shared decision was to proceed with the initiation of aspirin and statin therapy at the last office visit as he also has an elevated 10-year risk of estimated ASCVD.   Benign essential hypertension: Improved  Patient  has tolerated the initiation of antihypertensive medications since last visit.  Blood pressure log reviewed.  Home and office blood pressures have improved.  Patient may benefit from sleep study.  He is asked to discuss it further with PCP.  Low salt diet recommended.   Former smoker: Educated on the importance of continued smoking cessation.  Patient is encouraged to get vaccinated for COVID-19.   FINAL MEDICATION LIST END OF ENCOUNTER: No orders of the defined types were placed in this encounter.    Current Outpatient Medications:  .  aspirin EC 81 MG tablet, Take 1 tablet (81 mg total) by mouth daily. Swallow whole., Disp: 90 tablet, Rfl: 3 .  atorvastatin (LIPITOR) 20 MG tablet, Take 1 tablet (20 mg total) by mouth  at bedtime., Disp: 90 tablet, Rfl: 0 .  hydrochlorothiazide (HYDRODIURIL) 25 MG tablet, Take 1 tablet (25 mg total) by mouth in the morning., Disp: 90 tablet, Rfl: 0 .  metoprolol succinate (TOPROL-XL) 100 MG 24 hr tablet, Take 1 tablet (100 mg total) by mouth daily. Take with or immediately following a meal., Disp: 90 tablet, Rfl: 0  No orders of the defined types were placed in this encounter.  Patient Instructions  Please talk to your primary care provider for referral to pulmonary medicine so that you could be evaluated for possible COPD given your history of smoking and also be screened for lung cancer.  In addition, would also benefit from sleep study to evaluate the possibility of sleep apnea.    --Continue cardiac medications as reconciled in final medication list. --Return in about 6 months (around 06/07/2021) for Follow up, Coronary artery calcification, BP, Dyspnea. Or sooner if needed. --Continue follow-up with your primary care physician regarding the management of your other chronic comorbid conditions.  Patient's questions and concerns were addressed to his satisfaction. He voices understanding of the instructions provided during this encounter.   This  note was created using a voice recognition software as a result there may be grammatical errors inadvertently enclosed that do not reflect the nature of this encounter. Every attempt is made to correct such errors.  Rex Kras, [place], [facility]  Pager: [phone] Office: [phone]

## 2020-12-05 NOTE — Patient Instructions (Signed)
Please talk to your primary care provider for referral to pulmonary medicine so that you could be evaluated for possible COPD given your history of smoking and also be screened for lung cancer.  In addition, would also benefit from sleep study to evaluate the possibility of sleep apnea.

## 2020-12-18 DIAGNOSIS — Z1331 Encounter for screening for depression: Secondary | ICD-10-CM | POA: Diagnosis not present

## 2020-12-18 DIAGNOSIS — J449 Chronic obstructive pulmonary disease, unspecified: Secondary | ICD-10-CM | POA: Diagnosis not present

## 2020-12-18 DIAGNOSIS — Z1389 Encounter for screening for other disorder: Secondary | ICD-10-CM | POA: Diagnosis not present

## 2020-12-18 DIAGNOSIS — G9589 Other specified diseases of spinal cord: Secondary | ICD-10-CM | POA: Diagnosis not present

## 2020-12-18 DIAGNOSIS — M316 Other giant cell arteritis: Secondary | ICD-10-CM | POA: Diagnosis not present

## 2020-12-18 DIAGNOSIS — H00019 Hordeolum externum unspecified eye, unspecified eyelid: Secondary | ICD-10-CM | POA: Diagnosis not present

## 2020-12-18 DIAGNOSIS — R0782 Intercostal pain: Secondary | ICD-10-CM | POA: Diagnosis not present

## 2020-12-18 DIAGNOSIS — Z125 Encounter for screening for malignant neoplasm of prostate: Secondary | ICD-10-CM | POA: Diagnosis not present

## 2020-12-18 DIAGNOSIS — R0683 Snoring: Secondary | ICD-10-CM | POA: Diagnosis not present

## 2020-12-18 DIAGNOSIS — Z0000 Encounter for general adult medical examination without abnormal findings: Secondary | ICD-10-CM | POA: Diagnosis not present

## 2020-12-18 DIAGNOSIS — I25118 Atherosclerotic heart disease of native coronary artery with other forms of angina pectoris: Secondary | ICD-10-CM | POA: Diagnosis not present

## 2020-12-18 DIAGNOSIS — Z79899 Other long term (current) drug therapy: Secondary | ICD-10-CM | POA: Diagnosis not present

## 2020-12-18 DIAGNOSIS — R0600 Dyspnea, unspecified: Secondary | ICD-10-CM | POA: Diagnosis not present

## 2020-12-18 DIAGNOSIS — Z23 Encounter for immunization: Secondary | ICD-10-CM | POA: Diagnosis not present

## 2020-12-22 ENCOUNTER — Other Ambulatory Visit: Payer: Self-pay | Admitting: Cardiology

## 2020-12-22 DIAGNOSIS — I10 Essential (primary) hypertension: Secondary | ICD-10-CM

## 2020-12-22 DIAGNOSIS — R072 Precordial pain: Secondary | ICD-10-CM

## 2021-01-03 ENCOUNTER — Other Ambulatory Visit: Payer: Self-pay | Admitting: Cardiology

## 2021-01-03 DIAGNOSIS — Z8249 Family history of ischemic heart disease and other diseases of the circulatory system: Secondary | ICD-10-CM

## 2021-01-03 DIAGNOSIS — I2510 Atherosclerotic heart disease of native coronary artery without angina pectoris: Secondary | ICD-10-CM

## 2021-01-10 NOTE — Progress Notes (Signed)
01/13/21- 64 yoM former smoker (20 pkyrs) for sleep evaluation courtesy of Dr Francesco Sor with concern of snoring, SOB Medical problem list includes COPD, HTN, GERD, Cervical Radiculopathy, Depression,  Epworth score- 19 Body weight today-187 lbs Covid vax- Flu vax- -----Sleep consult-  snoring, waking up in the middle of the night, sleepy during the day.  No sleep meds. Drinks Pepsis for caffeine. Denies ENT surgery.and cardiac disease. He reports hx asthma, but was smoker. Uses albuterol hfa about 1x/ day- helps. Denies significant wheeze or cough.  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  Bedtime around 10PM, sleep latency up to 2 hours, WASO 2-3x, up bet 9:30-10:00AM.  Prior to Admission medications   Medication Sig Start Date End Date Taking? Authorizing Provider  aspirin EC 81 MG tablet Take 1 tablet (81 mg total) by mouth daily. Swallow whole. 10/28/20  Yes Tolia, Sunit, DO  atorvastatin (LIPITOR) 20 MG tablet TAKE 1 TABLET BY MOUTH EVERYDAY AT BEDTIME 01/07/21  Yes Tolia, Sunit, DO  hydrochlorothiazide (HYDRODIURIL) 25 MG tablet Take 1 tablet (25 mg total) by mouth in the morning. 10/28/20 01/26/21 Yes Tolia, Sunit, DO  metoprolol succinate (TOPROL-XL) 100 MG 24 hr tablet Take 1 tablet (100 mg total) by mouth daily. Take with or immediately following a meal. 10/28/20 01/26/21 Yes Tolia, Sunit, DO  dicyclomine (BENTYL) 20 MG tablet Take 1 tablet (20 mg total) by mouth 4 (four) times daily -  before meals and at bedtime. As needed for cramps and diarrhea 04/04/18 02/25/20  Gatha Mayer, MD  escitalopram (LEXAPRO) 10 MG tablet Take 1 tablet (10 mg total) by mouth daily. 04/26/18 02/25/20  Nafziger, Tommi Rumps, NP  triazolam (HALCION) 0.25 MG tablet Take 1 tab by mouth 1 hour prior to procedure with very light food, do not drive motor vehicle. 06/23/18 02/25/20  Magnus Sinning, MD   Past Medical History:  Diagnosis Date  . Anxiety   . Asthma    as a  child  . COPD, mild ([place]) 06/07/2014  . Depression   . GERD (gastroesophageal reflux disease)   . Hemorrhoids    Past Surgical History:  Procedure Laterality Date  . COLONOSCOPY  2011  . SHOULDER ARTHROSCOPY W/ ROTATOR CUFF REPAIR Right 02/08/2018   Family History  Problem Relation Age of Onset  . Diabetes Father   . Heart disease Father   . Hypertension Father   . Breast cancer Mother   . Hypertension Mother   . Cancer Sister        type unknown  . Cancer Brother        oral cancer   . Cancer Brother        oral cancer   . Colon cancer Neg Hx   . Esophageal cancer Neg Hx   . Rectal cancer Neg Hx   . Stomach cancer Neg Hx    Social History   Socioeconomic History  . Marital status: Married    Spouse name: Not on file  . Number of children: 1   . Years of education: 46   . Highest education level: Not on file  Occupational History  . Occupation: Psychologist, occupational: [employer]  Tobacco Use  . Smoking status: Former Smoker    Packs/day: 1.00    Years: 20.00    Pack years: 20.00    Types: Cigarettes    Quit date: 12/2019    Years since quitting: 1.0  .  Smokeless tobacco: Never Used  Vaping Use  . Vaping Use: Never used  Substance and Sexual Activity  . Alcohol use: Not Currently  . Drug use: No  . Sexual activity: Yes    Birth control/protection: None  Other Topics Concern  . Not on file  Social History Narrative   Married   1 grown daughter (prior relationship I think) lives [place] of [address]    He likes to be outside.    Works at [place] for [employer]   Former smoker, no EtOH and no caffeine or drugs      Social Determinants of Sales executive: Not on [employer] Insecurity: Not on file  Transportation Needs: Not on file  Physical Activity: Not on file  Stress: Not on file  Social Connections: Not on file  Intimate Partner Violence: Not on file   ROS-see HPI   + = positive Constitutional:     weight loss, night sweats, fevers, chills, fatigue, lassitude. HEENT:    headaches, difficulty swallowing, tooth/dental problems, sore throat,       Sneezing,+ itching, ear ache, nasal congestion, post nasal drip, snoring CV:    chest pain, orthopnea, PND, swelling in lower extremities, anasarca,                                   dizziness, palpitations Resp:   +shortness of breath with exertion or at rest.                +productive cough,   non-productive cough, coughing up of blood.              change in color of mucus.  wheezing.   Skin:    rash or lesions. GI:  + heartburn, indigestion, abdominal pain, nausea, vomiting, diarrhea,                 change in bowel habits, loss of appetite GU: dysuria, change in color of urine, no urgency or frequency.   flank pain. MS:   joint pain, stiffness, decreased range of motion, back pain. Neuro-     nothing unusual Psych:  change in mood or affect.  depression or anxiety.   memory loss.  OBJ- Physical Exam General- Alert, Oriented, Affect-appropriate, Distress- none acute Skin- rash-none, lesions- none, excoriation- none Lymphadenopathy- none Head- atraumatic            Eyes- Gross vision intact, PERRLA, conjunctivae and secretions clear            Ears- Hearing, canals-normal            Nose- Clear, no-Septal dev, mucus, polyps, erosion, perforation             Throat- Mallampati III , mucosa clear , drainage- none, tonsils- atrophic, + teeth Neck- flexible , trachea midline, no stridor , thyroid nl, carotid no bruit Chest - symmetrical excursion , unlabored           Heart/CV- RRR , no murmur , no gallop  , no rub, nl s1 s2                           - JVD- none , edema- none, stasis changes- none, varices- none           Lung- clear to P&A, wheeze- none, cough- none , dullness-none,  rub- none           Chest wall-  Abd-  Br/ Gen/ Rectal- Not done, not indicated Extrem- cyanosis- none, clubbing, none, atrophy- none, strength- nl Neuro-  grossly intact to observation

## 2021-01-13 ENCOUNTER — Encounter: Payer: Self-pay | Admitting: Internal Medicine

## 2021-01-13 ENCOUNTER — Ambulatory Visit (INDEPENDENT_AMBULATORY_CARE_PROVIDER_SITE_OTHER): Payer: Medicare HMO | Admitting: Internal Medicine

## 2021-01-13 ENCOUNTER — Other Ambulatory Visit: Payer: Self-pay

## 2021-01-13 VITALS — BP 136/78 | HR 77 | Temp 98.0°F | Ht 67.0 in | Wt 187.2 lb

## 2021-01-13 DIAGNOSIS — J449 Chronic obstructive pulmonary disease, unspecified: Secondary | ICD-10-CM | POA: Diagnosis not present

## 2021-01-13 DIAGNOSIS — R0683 Snoring: Secondary | ICD-10-CM | POA: Diagnosis not present

## 2021-01-13 DIAGNOSIS — R0600 Dyspnea, unspecified: Secondary | ICD-10-CM | POA: Diagnosis not present

## 2021-01-13 NOTE — Patient Instructions (Signed)
Order- schedule office spirometry before and after bronchodilator   Dx Dyspnea  Order- schedule home sleep test  Dx snoring  Please call us about 2 weeks after your sleep test for results and recommendations If appropriate, we may be able to start treatment before we see you next.

## 2021-01-14 ENCOUNTER — Encounter: Payer: Self-pay | Admitting: Internal Medicine

## 2021-01-14 DIAGNOSIS — R0683 Snoring: Secondary | ICD-10-CM | POA: Insufficient documentation

## 2021-01-14 NOTE — Assessment & Plan Note (Signed)
He calls it "asthma' with some benefit from rescue inhaler and some improvement since quitting smoking a year ago. COPD is probably the correct dx. Since he is trying to expedite NSGY evaluation, we will order spirometry before and after bronchodilator. Other meds and more formal PFT can be explored later.

## 2021-01-14 NOTE — Assessment & Plan Note (Signed)
Appropriate discussion including medical issues, sleep hygiene, safe driving responsibility, testing, potential treatment options, Plan- sleep study

## 2021-01-20 ENCOUNTER — Other Ambulatory Visit: Payer: Self-pay | Admitting: Cardiology

## 2021-01-20 DIAGNOSIS — R0609 Other forms of dyspnea: Secondary | ICD-10-CM

## 2021-01-20 DIAGNOSIS — R072 Precordial pain: Secondary | ICD-10-CM

## 2021-01-23 ENCOUNTER — Telehealth: Payer: Self-pay | Admitting: Internal Medicine

## 2021-01-23 NOTE — Telephone Encounter (Signed)
Spoke to pt & scheduled him for hst.  Nothing further needed.

## 2021-01-24 ENCOUNTER — Ambulatory Visit: Payer: Medicare HMO

## 2021-01-24 ENCOUNTER — Other Ambulatory Visit: Payer: Self-pay

## 2021-01-24 DIAGNOSIS — G4733 Obstructive sleep apnea (adult) (pediatric): Secondary | ICD-10-CM | POA: Diagnosis not present

## 2021-01-24 DIAGNOSIS — R0683 Snoring: Secondary | ICD-10-CM

## 2021-02-04 ENCOUNTER — Telehealth: Payer: Self-pay

## 2021-02-04 DIAGNOSIS — Z006 Encounter for examination for normal comparison and control in clinical research program: Secondary | ICD-10-CM

## 2021-02-04 NOTE — Telephone Encounter (Signed)
Called patient for 90 day Identify phone call pt stated he is not having anymore cardiac symptoms but did follow up with PCP, I reminded the patient that we would be calling him back around February for a year follow up phone call.

## 2021-02-04 NOTE — Telephone Encounter (Signed)
Called patient for 90 day Identify phone call no answer, I left a voicemail stating the intent of the phone call and our call back number to be reached in our department. 

## 2021-03-28 ENCOUNTER — Ambulatory Visit: Payer: Medicare HMO

## 2021-03-28 ENCOUNTER — Other Ambulatory Visit: Payer: Self-pay

## 2021-03-28 NOTE — Progress Notes (Signed)
Multiple attempts made to reach patient; LMTCB to reschedule PV and direct colon on call phone; LMTCB (plain message, no descriptions of reasons) with wife; no show letter sent to patient;

## 2021-04-04 ENCOUNTER — Other Ambulatory Visit: Payer: Self-pay | Admitting: Cardiology

## 2021-04-04 DIAGNOSIS — Z8249 Family history of ischemic heart disease and other diseases of the circulatory system: Secondary | ICD-10-CM

## 2021-04-04 DIAGNOSIS — I2510 Atherosclerotic heart disease of native coronary artery without angina pectoris: Secondary | ICD-10-CM

## 2021-04-04 DIAGNOSIS — I2584 Coronary atherosclerosis due to calcified coronary lesion: Secondary | ICD-10-CM

## 2021-04-09 ENCOUNTER — Encounter: Payer: Medicare HMO | Admitting: Internal Medicine

## 2021-04-14 ENCOUNTER — Other Ambulatory Visit (HOSPITAL_COMMUNITY): Payer: Medicare HMO

## 2021-04-16 NOTE — Progress Notes (Deleted)
01/13/21- 64 yoM former smoker (20 pkyrs) for sleep evaluation courtesy of Dr Francesco Sor with concern of snoring, SOB Medical problem list includes COPD, HTN, GERD, Cervical Radiculopathy, Depression,  Epworth score- 19 Body weight today-187 lbs Covid vax- Flu vax- -----Sleep consult-  snoring, waking up in the middle of the night, sleepy during the day.  No sleep meds. Drinks Pepsis for caffeine. Denies ENT surgery.and cardiac disease. He reports hx asthma, but was smoker. Uses albuterol hfa about 1x/ day- helps. Denies significant wheeze or cough.  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  Bedtime around 10PM, sleep latency up to 2 hours, WASO 2-3x, up bet 9:30-10:00AM.  04/17/21- 64 yoM former smoker (20 pkyrs) followed for OSA, complicated by COPD, HTN, GERD, Cervical Radiculopathy, Depression,  HST 01/26/21- AHI  9.3/ hr, desaturation to 86%, body weight 187 lbs PFT 01/13/21  not done Body weight today- Covid vax-   ROS-see HPI   + = positive Constitutional:    weight loss, night sweats, fevers, chills, fatigue, lassitude. HEENT:    headaches, difficulty swallowing, tooth/dental problems, sore throat,       Sneezing,+ itching, ear ache, nasal congestion, post nasal drip, snoring CV:    chest pain, orthopnea, PND, swelling in lower extremities, anasarca,                                   dizziness, palpitations Resp:   +shortness of breath with exertion or at rest.                +productive cough,   non-productive cough, coughing up of blood.              change in color of mucus.  wheezing.   Skin:    rash or lesions. GI:  + heartburn, indigestion, abdominal pain, nausea, vomiting, diarrhea,                 change in bowel habits, loss of appetite GU: dysuria, change in color of urine, no urgency or frequency.   flank pain. MS:   joint pain, stiffness, decreased range of motion, back pain. Neuro-     nothing unusual Psych:   change in mood or affect.  depression or anxiety.   memory loss.  OBJ- Physical Exam General- Alert, Oriented, Affect-appropriate, Distress- none acute Skin- rash-none, lesions- none, excoriation- none Lymphadenopathy- none Head- atraumatic            Eyes- Gross vision intact, PERRLA, conjunctivae and secretions clear            Ears- Hearing, canals-normal            Nose- Clear, no-Septal dev, mucus, polyps, erosion, perforation             Throat- Mallampati III , mucosa clear , drainage- none, tonsils- atrophic, + teeth Neck- flexible , trachea midline, no stridor , thyroid nl, carotid no bruit Chest - symmetrical excursion , unlabored           Heart/CV- RRR , no murmur , no gallop  , no rub, nl s1 s2                           - JVD- none , edema- none, stasis changes- none, varices- none  Lung- clear to P&A, wheeze- none, cough- none , dullness-none, rub- none           Chest wall-  Abd-  Br/ Gen/ Rectal- Not done, not indicated Extrem- cyanosis- none, clubbing, none, atrophy- none, strength- nl Neuro- grossly intact to observation

## 2021-04-17 ENCOUNTER — Other Ambulatory Visit: Payer: Self-pay

## 2021-04-17 ENCOUNTER — Ambulatory Visit: Payer: Medicare HMO | Admitting: Internal Medicine

## 2021-04-21 ENCOUNTER — Other Ambulatory Visit: Payer: Self-pay

## 2021-04-21 DIAGNOSIS — I2510 Atherosclerotic heart disease of native coronary artery without angina pectoris: Secondary | ICD-10-CM

## 2021-04-21 DIAGNOSIS — Z8249 Family history of ischemic heart disease and other diseases of the circulatory system: Secondary | ICD-10-CM

## 2021-04-21 MED ORDER — ATORVASTATIN CALCIUM 20 MG PO TABS: ORAL_TABLET | ORAL | 0 refills | Status: DC

## 2021-04-26 DIAGNOSIS — Z20822 Contact with and (suspected) exposure to covid-19: Secondary | ICD-10-CM | POA: Diagnosis not present

## 2021-04-30 ENCOUNTER — Ambulatory Visit (INDEPENDENT_AMBULATORY_CARE_PROVIDER_SITE_OTHER): Payer: Medicare HMO

## 2021-04-30 ENCOUNTER — Ambulatory Visit (INDEPENDENT_AMBULATORY_CARE_PROVIDER_SITE_OTHER): Payer: Medicare HMO | Admitting: Internal Medicine

## 2021-04-30 ENCOUNTER — Ambulatory Visit (INDEPENDENT_AMBULATORY_CARE_PROVIDER_SITE_OTHER): Payer: Medicare HMO | Admitting: Adult Health

## 2021-04-30 ENCOUNTER — Encounter: Payer: Self-pay | Admitting: Adult Health

## 2021-04-30 ENCOUNTER — Other Ambulatory Visit: Payer: Self-pay

## 2021-04-30 VITALS — BP 118/74 | HR 65 | Temp 98.2°F | Ht 67.5 in | Wt 174.4 lb

## 2021-04-30 DIAGNOSIS — J449 Chronic obstructive pulmonary disease, unspecified: Secondary | ICD-10-CM

## 2021-04-30 DIAGNOSIS — R0683 Snoring: Secondary | ICD-10-CM | POA: Diagnosis not present

## 2021-04-30 DIAGNOSIS — F17210 Nicotine dependence, cigarettes, uncomplicated: Secondary | ICD-10-CM

## 2021-04-30 DIAGNOSIS — G4733 Obstructive sleep apnea (adult) (pediatric): Secondary | ICD-10-CM | POA: Diagnosis not present

## 2021-04-30 DIAGNOSIS — R0600 Dyspnea, unspecified: Secondary | ICD-10-CM | POA: Diagnosis not present

## 2021-04-30 DIAGNOSIS — F17200 Nicotine dependence, unspecified, uncomplicated: Secondary | ICD-10-CM

## 2021-04-30 DIAGNOSIS — Z01811 Encounter for preprocedural respiratory examination: Secondary | ICD-10-CM

## 2021-04-30 LAB — PULMONARY FUNCTION TEST
FEF 25-75 Post: 1.49 L/sec
FEF 25-75 Pre: 2.2 L/sec
FEF2575-%Change-Post: -32 %
FEF2575-%Pred-Post: 58 %
FEF2575-%Pred-Pre: 86 %
FEV1-%Change-Post: -4 %
FEV1-%Pred-Post: 88 %
FEV1-%Pred-Pre: 93 %
FEV1-Post: 2.42 L
FEV1-Pre: 2.54 L
FEV1FVC-%Change-Post: 1 %
FEV1FVC-%Pred-Pre: 96 %
FEV6-%Change-Post: -5 %
FEV6-%Pred-Post: 90 %
FEV6-%Pred-Pre: 96 %
FEV6-Post: 3.09 L
FEV6-Pre: 3.27 L
FEV6FVC-%Change-Post: 0 %
FEV6FVC-%Pred-Post: 102 %
FEV6FVC-%Pred-Pre: 103 %
FVC-%Change-Post: -6 %
FVC-%Pred-Post: 89 %
FVC-%Pred-Pre: 95 %
FVC-Post: 3.17 L
FVC-Pre: 3.38 L
Post FEV1/FVC ratio: 76 %
Post FEV6/FVC ratio: 98 %
Pre FEV1/FVC ratio: 75 %
Pre FEV6/FVC Ratio: 99 %

## 2021-04-30 MED ORDER — ALBUTEROL SULFATE HFA 108 (90 BASE) MCG/ACT IN AERS: 1.0000 | INHALATION_SPRAY | Freq: Four times a day (QID) | RESPIRATORY_TRACT | 2 refills | Status: DC | PRN

## 2021-04-30 NOTE — Progress Notes (Signed)
$'@Patient'O$  ID: Joshua Stephenson, male    DOB: 04-04-1956, 65 y.o.   MRN: [id number]  Chief Complaint  Patient presents with   Follow-up    Referring provider: Sueanne Margarita, DO  HPI: 65 year old male smoker seen for pulmonary and sleep consult January 13, 2021 for COPD and daytime sleepiness found to have mild obstructive sleep apnea Medical history + for HTN , Hyperlipidemia   TEST/EVENTS :   04/30/2021 Follow up : COPD and OSA  Patient returns for a 39-monthfollow-up.  Patient was seen last visit for a pulmonary and sleep consult.  Patient is a active smoker for many years. Smoked 45 years 1PPD .  Carries a diagnosis of COPD.  Has an albuterol inhaler. Uses most days 1 time daily .   Patient was set up for pulmonary function testing that was completed today that shows no significant airflow obstruction or restriction.  FEV1 was 93%, ratio 75, FVC 95%.  No significant bronchodilator response.   Disabled from shoulder issues . Tries to stay active but limited with shoulder issues. Tries to walk. Some shortness of breath with heavy activities. No dyspnea at rest.  Rare cough or wheezing .  Discussed LDCT chest screening program. He would like to participate.    Patient had daytime sleepiness and snoring.  He was set up for home sleep study this was completed on Jan 26, 2021.  This showed mild obstructive sleep apnea with AHI at 9.3/hour and SPO2 low at 86%.  Average O2 saturations at 94%.  We discussed his sleep study results.  We went over treatment options including weight loss, oral appliance, CPAP. He would like referral for oral appliance.  Has daytime sleepiness, fall asleep watching TV , snoring , restless sleep with unrefreshed sleep .   Needs surgical clearance for upcoming neck surgery . Patient is independent, lives at home.  Has no limitations except for history of shoulder issues.  No recent flare up breathing no recent antibiotics or steroids.  As above pulmonary function  testing was normal with no airflow obstruction or restriction.  Patient is not on oxygen.  O2 saturations today are 100% on room air.  No Known Allergies  Immunization History  Administered Date(s) Administered   Influenza,inj,Quad PF,6+ Mos 06/07/2014   Pneumococcal Polysaccharide-23 12/18/2020   Td 01/25/2018   Tdap 02/14/2018    Past Medical History:  Diagnosis Date   Anxiety    Asthma    as a child   COPD, mild ([facility] 06/07/2014   Depression    GERD (gastroesophageal reflux disease)    Hemorrhoids     Tobacco History: Social History   Tobacco Use  Smoking Status Every Day   Packs/day: 1.00   Years: 20.00   Pack years: 20.00   Types: Cigarettes   Last attempt to quit: 12/2019   Years since quitting: 1.3  Smokeless Tobacco Never  Tobacco Comments   Back smoking 1ppd -04/30/2021   Ready to quit: No Counseling given: Yes Tobacco comments: Back smoking 1ppd -04/30/2021   Outpatient Medications Prior to Visit  Medication Sig Dispense Refill   aspirin EC 81 MG tablet Take 1 tablet (81 mg total) by mouth daily. Swallow whole. 90 tablet 3   atorvastatin (LIPITOR) 20 MG tablet TAKE 1 TABLET BY MOUTH EVERYDAY AT BEDTIME 90 tablet 0   hydrochlorothiazide (HYDRODIURIL) 25 MG tablet TAKE 1 TABLET (25 MG TOTAL) BY MOUTH IN THE MORNING. 90 tablet 0   metoprolol succinate (TOPROL-XL) 100 MG 24 hr  tablet Take 1 tablet (100 mg total) by mouth daily. Take with or immediately following a meal. 90 tablet 0   No facility-administered medications prior to visit.     Review of Systems:   Constitutional:   No  weight loss, night sweats,  Fevers, chills, fatigue, or  lassitude.  HEENT:   No headaches,  Difficulty swallowing,  Tooth/dental problems, or  Sore throat,                No sneezing, itching, ear ache, nasal congestion, post nasal drip,   CV:  No chest pain,  Orthopnea, PND, swelling in lower extremities, anasarca, dizziness, palpitations, syncope.   GI  No heartburn,  indigestion, abdominal pain, nausea, vomiting, diarrhea, change in bowel habits, loss of appetite, bloody stools.   Resp:   No excess mucus, no productive cough,  No non-productive cough,  No coughing up of blood.  No change in color of mucus.  No wheezing.  No chest wall deformity  Skin: no rash or lesions.  GU: no dysuria, change in color of urine, no urgency or frequency.  No flank pain, no hematuria   MS:  No joint pain or swelling.  No decreased range of motion.  No back pain.    Physical Exam  BP 118/74 (BP Location: Left Arm, Patient Position: Sitting, Cuff Size: Normal)   Pulse 65   Temp 98.2 F (36.8 C) (Oral)   Ht 5' 7.5" (1.715 m)   Wt 174 lb 6.4 oz (79.1 kg)   SpO2 100%   BMI 26.91 kg/m   GEN: A/Ox3; pleasant , NAD, well nourished    HEENT:  [place]/AT,  EACs-clear, TMs-wnl, NOSE-clear, THROAT-clear, no lesions, no postnasal drip or exudate noted.  Class II-III MP airway  NECK:  Supple w/ fair ROM; no JVD; normal carotid impulses w/o bruits; no thyromegaly or nodules palpated; no lymphadenopathy.    RESP  Clear  P & A; w/o, wheezes/ rales/ or rhonchi. no accessory muscle use, no dullness to percussion  CARD:  RRR, no m/r/g, no peripheral edema, pulses intact, no cyanosis or clubbing.  GI:   Soft & nt; nml bowel sounds; no organomegaly or masses detected.   Musco: Warm bil, no deformities or joint swelling noted.   Neuro: alert, no focal deficits noted.    Skin: Warm, no lesions or rashes    Lab Results:    BNP No results found for: BNP  ProBNP   Imaging: No results found.    PFT Results Latest Ref Rng & Units 04/30/2021  FVC-Pre L 3.38  FVC-Predicted Pre % 95  FVC-Post L 3.17  FVC-Predicted Post % 89  Pre FEV1/FVC % % 75  Post FEV1/FCV % % 76  FEV1-Pre L 2.54  FEV1-Predicted Pre % 93  FEV1-Post L 2.42    No results found for: NITRICOXIDE      Assessment & Plan:   COPD, mild Heavy smoking history.  Pulmonary function testing today  shows no significant airflow obstruction or restriction.  Have encouraged him on smoking cessation in detail. May use albuterol inhaler as needed.  For now hold on any maintenance medications as he has no significant symptoms currently. Refer to the low-dose CT screening program.  Plan  Patient Instructions  Albuterol inhaler As needed  for wheezing .  Activity as tolerated  Work on not smoking .  Refer to LDCT chest screening program . Refer to Dr. Ron Parker for oral appliance for sleep apnea.  Chest xray today .  Follow up with Dr. Annamaria Boots  in 4-6 months and As needed          Smoker Smoking cessation discussed  OSA (obstructive sleep apnea) Mild obstructive sleep apnea-we discussed his sleep study results in detail.  Patient education given. Patient would like to referral for oral appliance.  Plan  Patient Instructions  Albuterol inhaler As needed  for wheezing .  Activity as tolerated  Work on not smoking .  Refer to LDCT chest screening program . Refer to Dr. Ron Parker for oral appliance for sleep apnea.  Chest xray today .  Follow up with Dr. Annamaria Boots  in 4-6 months and As needed          Preop pulmonary/respiratory exam Pulmonary preop risk assessment-patient has minimum COPD and a heavy smoker.  Pulmonary function testing shows normal lung function.  He does have mild obstructive sleep apnea We discussed that he has a mild to moderate pulmonary surgical risk. Have recommended smoking cessation.  If unable to quit.  At least no smoking for 1 week prior to surgery. May use his albuterol as needed.  Early mobility and mucociliary clearance are key. Patient is being referred for oral appliance for sleep apnea.  In postop setting with underlying mild obstructive sleep apnea could use CPAP if indicated Check Chest xray today    Major Pulmonary risks identified in the multifactorial risk analysis are but not limited to a) pneumonia; b) recurrent intubation risk; c) prolonged or  recurrent acute respiratory failure needing mechanical ventilation; d) prolonged hospitalization; e) DVT/Pulmonary embolism; f) Acute Pulmonary edema  Recommend 1. Short duration of surgery as much as possible and avoid paralytic if possible 2. Recovery in step down or ICU with Pulmonary consultation if indicated  3. DVT prophylaxis if indicated  4. Aggressive pulmonary toilet with o2, bronchodilatation, and incentive spirometry and early ambulation, CPAP if needed      Plan  Patient Instructions  Albuterol inhaler As needed  for wheezing .  Activity as tolerated  Work on not smoking .  Refer to LDCT chest screening program . Refer to Dr. Ron Parker for oral appliance for sleep apnea.  Chest xray today .  Follow up with Dr. Annamaria Boots  in 4-6 months and As needed           I spent   40 minutes dedicated to the care of this patient on the date of this encounter to include pre-visit review of records, face-to-face time with the patient discussing conditions above, post visit ordering of testing, clinical documentation with the electronic health record, making appropriate referrals as documented, and communicating necessary findings to members of the patients care team.   Rexene Edison, NP 04/30/2021

## 2021-04-30 NOTE — Assessment & Plan Note (Signed)
Smoking cessation discussed 

## 2021-04-30 NOTE — Assessment & Plan Note (Signed)
Mild obstructive sleep apnea-we discussed his sleep study results in detail.  Patient education given. Patient would like to referral for oral appliance.  Plan  Patient Instructions  Albuterol inhaler As needed  for wheezing .  Activity as tolerated  Work on not smoking .  Refer to LDCT chest screening program . Refer to Dr. Ron Parker for oral appliance for sleep apnea.  Chest xray today .  Follow up with Dr. Annamaria Boots  in 4-6 months and As needed

## 2021-04-30 NOTE — Progress Notes (Signed)
Spirometry pre and post done today. 

## 2021-04-30 NOTE — Assessment & Plan Note (Signed)
Heavy smoking history.  Pulmonary function testing today shows no significant airflow obstruction or restriction.  Have encouraged him on smoking cessation in detail. May use albuterol inhaler as needed.  For now hold on any maintenance medications as he has no significant symptoms currently. Refer to the low-dose CT screening program.  Plan  Patient Instructions  Albuterol inhaler As needed  for wheezing .  Activity as tolerated  Work on not smoking .  Refer to LDCT chest screening program . Refer to Dr. Ron Parker for oral appliance for sleep apnea.  Chest xray today .  Follow up with Dr. Annamaria Boots  in 4-6 months and As needed

## 2021-04-30 NOTE — Patient Instructions (Addendum)
Albuterol inhaler As needed  for wheezing .  Activity as tolerated  Work on not smoking .  Refer to LDCT chest screening program . Refer to Dr. Ron Parker for oral appliance for sleep apnea.  Chest xray today .  Follow up with Dr. Annamaria Boots  in 4-6 months and As needed

## 2021-04-30 NOTE — Assessment & Plan Note (Signed)
Pulmonary preop risk assessment-patient has minimum COPD and a heavy smoker.  Pulmonary function testing shows normal lung function.  He does have mild obstructive sleep apnea We discussed that he has a mild to moderate pulmonary surgical risk. Have recommended smoking cessation.  If unable to quit.  At least no smoking for 1 week prior to surgery. May use his albuterol as needed.  Early mobility and mucociliary clearance are key. Patient is being referred for oral appliance for sleep apnea.  In postop setting with underlying mild obstructive sleep apnea could use CPAP if indicated Check Chest xray today    Major Pulmonary risks identified in the multifactorial risk analysis are but not limited to a) pneumonia; b) recurrent intubation risk; c) prolonged or recurrent acute respiratory failure needing mechanical ventilation; d) prolonged hospitalization; e) DVT/Pulmonary embolism; f) Acute Pulmonary edema  Recommend 1. Short duration of surgery as much as possible and avoid paralytic if possible 2. Recovery in step down or ICU with Pulmonary consultation if indicated  3. DVT prophylaxis if indicated  4. Aggressive pulmonary toilet with o2, bronchodilatation, and incentive spirometry and early ambulation, CPAP if needed      Plan  Patient Instructions  Albuterol inhaler As needed  for wheezing .  Activity as tolerated  Work on not smoking .  Refer to LDCT chest screening program . Refer to Dr. Ron Parker for oral appliance for sleep apnea.  Chest xray today .  Follow up with Dr. Annamaria Boots  in 4-6 months and As needed

## 2021-05-01 ENCOUNTER — Telehealth: Payer: Self-pay | Admitting: Internal Medicine

## 2021-05-01 MED ORDER — ALBUTEROL SULFATE HFA 108 (90 BASE) MCG/ACT IN AERS: 1.0000 | INHALATION_SPRAY | Freq: Four times a day (QID) | RESPIRATORY_TRACT | 2 refills | Status: DC | PRN

## 2021-05-01 NOTE — Telephone Encounter (Signed)
LM informing patient his prescription has been sent to pharmacy of choice.   Nothing further needed at this time.

## 2021-05-01 NOTE — Telephone Encounter (Signed)
Pt stated that his medication was sent to the wrong [facility] he said that all of his medications should be sent to;  [facility]; [facility]; [address] [place], [place], [place] [address]  [place] regard; [phone]

## 2021-05-22 ENCOUNTER — Other Ambulatory Visit: Payer: Self-pay | Admitting: *Deleted

## 2021-05-22 MED ORDER — ALBUTEROL SULFATE HFA 108 (90 BASE) MCG/ACT IN AERS: 1.0000 | INHALATION_SPRAY | Freq: Four times a day (QID) | RESPIRATORY_TRACT | 2 refills | Status: DC | PRN

## 2021-06-02 ENCOUNTER — Other Ambulatory Visit: Payer: Self-pay | Admitting: *Deleted

## 2021-06-02 DIAGNOSIS — Z87891 Personal history of nicotine dependence: Secondary | ICD-10-CM

## 2021-06-02 DIAGNOSIS — F17210 Nicotine dependence, cigarettes, uncomplicated: Secondary | ICD-10-CM

## 2021-06-04 DIAGNOSIS — M4802 Spinal stenosis, cervical region: Secondary | ICD-10-CM | POA: Diagnosis not present

## 2021-06-04 DIAGNOSIS — M5412 Radiculopathy, cervical region: Secondary | ICD-10-CM | POA: Diagnosis not present

## 2021-06-09 ENCOUNTER — Ambulatory Visit: Payer: Medicare HMO | Admitting: Cardiology

## 2021-06-10 ENCOUNTER — Other Ambulatory Visit: Payer: Self-pay

## 2021-06-10 ENCOUNTER — Ambulatory Visit: Payer: Medicare HMO | Admitting: Cardiology

## 2021-06-10 ENCOUNTER — Encounter: Payer: Self-pay | Admitting: Cardiology

## 2021-06-10 VITALS — BP 132/85 | HR 76 | Temp 98.0°F | Resp 16 | Ht 67.5 in | Wt 180.2 lb

## 2021-06-10 DIAGNOSIS — I10 Essential (primary) hypertension: Secondary | ICD-10-CM

## 2021-06-10 DIAGNOSIS — R0609 Other forms of dyspnea: Secondary | ICD-10-CM | POA: Diagnosis not present

## 2021-06-10 DIAGNOSIS — Z8249 Family history of ischemic heart disease and other diseases of the circulatory system: Secondary | ICD-10-CM | POA: Diagnosis not present

## 2021-06-10 DIAGNOSIS — I700 Atherosclerosis of aorta: Secondary | ICD-10-CM

## 2021-06-10 DIAGNOSIS — I2510 Atherosclerotic heart disease of native coronary artery without angina pectoris: Secondary | ICD-10-CM | POA: Diagnosis not present

## 2021-06-10 DIAGNOSIS — R0600 Dyspnea, unspecified: Secondary | ICD-10-CM

## 2021-06-10 DIAGNOSIS — Z87891 Personal history of nicotine dependence: Secondary | ICD-10-CM

## 2021-06-10 DIAGNOSIS — I2584 Coronary atherosclerosis due to calcified coronary lesion: Secondary | ICD-10-CM

## 2021-06-10 NOTE — Progress Notes (Signed)
Date:  06/10/2021   ID:  Joshua Stephenson, DOB Sep 08, 1956, MRN [id number]  PCP:  Joshua Margarita, DO  Cardiologist:  Joshua Kras, DO, [facility] (established care October 01, 2020)  Date: 06/10/21 Last Office Visit: 12/05/2020.    Chief Complaint  Patient presents with   Coronary artery calcification    HPI  Joshua Stephenson is a 65 y.o. male who presents to the office with a chief complaint of " 93-monthfollow-up for mild nonobstructive CAD."  Patient's past medical history and cardiovascular risk factors include: Benign essential hypertension, former smoker (69-pack-years hx of smoking), COPD, family history of premature CAD, aortic atherosclerosis, mild coronary artery calcification.  He is referred to the office at the request of SSueanne Margarita DO for evaluation of chest pain.  Patient initially presented to the office for evaluation of chest pain and underwent an ischemic evaluation including a coronary CTA results noted below for further reference.  He was noted to have mild coronary artery calcification and minimal nonobstructive CAD.  Given his estimated 10-year risk of ASCVD and coronary and aortic atherosclerosis and the shared decision was to start aspirin and statin therapy.  He is tolerated the medications well without any side effects or intolerances.  He presents today for 672-monthollow-up.  He denies any chest pain at rest or with effort related activities.  He is overall euvolemic and not in congestive heart failure.  He still has effort related dyspnea which is chronic and stable.  He has establish care with pulmonary medicine and is currently undergoing evaluation.  No hospitalizations or urgent care visits for cardiovascular symptoms since last office encounter.  FUNCTIONAL STATUS: No structured exercise program or daily routine.   ALLERGIES: No Known Allergies  MEDICATION LIST PRIOR TO VISIT: Current Meds  Medication Sig   albuterol (VENTOLIN HFA) 108  (90 Base) MCG/ACT inhaler Inhale 1-2 puffs into the lungs every 6 (six) hours as needed for wheezing or shortness of breath.   aspirin EC 81 MG tablet Take 1 tablet (81 mg total) by mouth daily. Swallow whole.   atorvastatin (LIPITOR) 20 MG tablet TAKE 1 TABLET BY MOUTH EVERYDAY AT BEDTIME   hydrochlorothiazide (HYDRODIURIL) 25 MG tablet TAKE 1 TABLET (25 MG TOTAL) BY MOUTH IN THE MORNING.   metoprolol succinate (TOPROL-XL) 100 MG 24 hr tablet Take 1 tablet (100 mg total) by mouth daily. Take with or immediately following a meal.     PAST MEDICAL HISTORY: Past Medical History:  Diagnosis Date   Anxiety    Asthma    as a child   Atherosclerosis of aorta (HCC)    COPD, mild ([facility]/17/2015   Coronary artery calcification    Depression    GERD (gastroesophageal reflux disease)    Hemorrhoids     PAST SURGICAL HISTORY: Past Surgical History:  Procedure Laterality Date   COLONOSCOPY  2011   SHOULDER ARTHROSCOPY W/ ROTATOR CUFF REPAIR Right 02/08/2018    FAMILY HISTORY: The patient family history includes Breast cancer in his mother; Cancer in his brother, brother, and sister; Diabetes in his father; Heart disease in his father; Hypertension in his father and mother.  SOCIAL HISTORY:  The patient  reports that he has been smoking cigarettes. He has a 20.00 pack-year smoking history. He has never used smokeless tobacco. He reports that he does not currently use alcohol. He reports that he does not use drugs.  REVIEW OF SYSTEMS: Review of Systems  Constitutional: Negative for chills and fever.  HENT:  Negative for hoarse voice and nosebleeds.   Eyes:  Negative for discharge, double vision and pain.  Cardiovascular:  Positive for dyspnea on exertion (Chronic and stable). Negative for chest pain, claudication, leg swelling, near-syncope, orthopnea, palpitations, paroxysmal nocturnal dyspnea and syncope.  Respiratory:  Negative for hemoptysis and shortness of breath.   Musculoskeletal:   Negative for muscle cramps and myalgias.  Gastrointestinal:  Negative for abdominal pain, constipation, diarrhea, hematemesis, hematochezia, melena, nausea and vomiting.  Neurological:  Negative for dizziness and light-headedness.   PHYSICAL EXAM: Vitals with BMI 06/10/2021 04/30/2021 01/13/2021  Height 5' 7.5" 5' 7.5" 5' 7"   Weight 180 lbs 3 oz 174 lbs 6 oz 187 lbs 3 oz  BMI 27.79 24.2 35.36  Systolic 144 315 400  Diastolic 85 74 78  Pulse 76 65 77   CONSTITUTIONAL: Well-developed and well-nourished. No acute distress.  SKIN: Skin is warm and dry. No rash noted. No cyanosis. No pallor. No jaundice HEAD: Normocephalic and atraumatic.  EYES: No scleral icterus MOUTH/THROAT: Moist oral membranes.  NECK: No JVD present. No thyromegaly noted. No carotid bruits  LYMPHATIC: No visible cervical adenopathy.  CHEST Normal respiratory effort. No intercostal retractions  LUNGS: Decreased breath sounds bilaterally, No stridor. No wheezes. No rales.  CARDIOVASCULAR: Regular rate and rhythm, positive S1-S2, no murmurs rubs or gallops appreciated. ABDOMINAL: Soft, nontender, nondistended, positive bowel sounds in all 4 quadrants, no apparent ascites.  EXTREMITIES: No peripheral edema  HEMATOLOGIC: No significant bruising NEUROLOGIC: Oriented to person, place, and time. Nonfocal. Normal muscle tone.  PSYCHIATRIC: Normal mood and affect. Normal behavior. Cooperative  CARDIAC DATABASE: EKG: 06/10/2021: Normal sinus rhythm, 82 bpm, LAE, ST-T changes suggestive of early repolarization, without underlying injury pattern.  Echocardiogram: 10/10/2020: Normal LV systolic function with visual EF 55-60%. Left ventricle cavity is normal in size. Moderate left ventricular hypertrophy. Normal global wall motion. No significant valvular heart disease. No prior study for comparison   Stress Testing: No results found for this or any previous visit from the past 1095 days.  CCTA:  10/23/2020:  Total calcium  score: 19 AU, which places the patient in the 63th percentile for age and sex matched control. Normal coronary origin with right dominance. CAD-RADS = 1. Minimal stenosis in distal LAD, Minimal stenosis in ostial LCX. Aortic atherosclerosis.   Heart Catheterization: None  LABORATORY DATA: CBC Latest Ref Rng & Units 04/04/2018 10/06/2017 06/16/2017  WBC 4.0 - 10.5 K/uL 6.4 4.9 7.1  Hemoglobin 13.0 - 17.0 g/dL 14.2 13.8 13.9  Hematocrit 39.0 - 52.0 % 40.5 39.6 40.4  Platelets 150.0 - 400.0 K/uL 279.0 236.0 146.0 Result may be falsely decreased due to platelet clumping.(L)    CMP Latest Ref Rng & Units 11/05/2020 10/18/2020 10/06/2017  Glucose 65 - 99 mg/dL 78 153(H) 89  BUN 8 - 27 mg/dL 10 13 20   Creatinine 0.76 - 1.27 mg/dL 1.25 1.24 0.96  Sodium 134 - 144 mmol/L 140 142 141  Potassium 3.5 - 5.2 mmol/L 4.0 4.1 4.1  Chloride 96 - 106 mmol/L 103 99 105  CO2 20 - 29 mmol/L 23 22 30   Calcium 8.6 - 10.2 mg/dL 9.9 9.8 9.2  Total Protein 6.0 - 8.3 g/dL - - 6.8  Total Bilirubin 0.2 - 1.2 mg/dL - - 0.6  Alkaline Phos 39 - 117 U/L - - 54  AST 0 - 37 U/L - - 16  ALT 0 - 53 U/L - - 9    Lipid Panel     Component Value Date/Time  CHOL 162 06/16/2017 1717   TRIG 128.0 06/16/2017 1717   HDL 47.90 06/16/2017 1717   CHOLHDL 3 06/16/2017 1717   VLDL 25.6 06/16/2017 1717   LDLCALC 89 06/16/2017 1717    No components found for: NTPROBNP No results for input(s): PROBNP in the last 8760 hours. No results for input(s): TSH in the last 8760 hours.  BMP Recent Labs    10/18/20 0919 11/05/20 1147  NA 142 140  K 4.1 4.0  CL 99 103  CO2 22 23  GLUCOSE 153* 78  BUN 13 10  CREATININE 1.24 1.25  CALCIUM 9.8 9.9  GFRNONAA 62 61  GFRAA 72 71    HEMOGLOBIN A1C Lab Results  Component Value Date   HGBA1C 5.3 06/16/2017   External Labs: Collected: 09/12/2020 Creatinine 1.2 mg/dL. eGFR: 74 mL/min per 1.73 m Sodium 140, potassium 4.1, chloride 105, bicarb 23, AST 17, ALT 18, alkaline  phosphatase 91 Hemoglobin 17 g/dL, hematocrit 47.6% Lipid profile: Total cholesterol 149, triglycerides 81, HDL 39, LDL 94 TSH: 1.02  IMPRESSION:    ICD-10-CM   1. Coronary atherosclerosis due to calcified coronary lesion  I25.10    I25.84     2. Atherosclerosis of aorta (HCC)  I70.0     3. Dyspnea on exertion  R06.00     4. Benign hypertension  I10 EKG 12-Lead    5. Former smoker  Z87.891     59. Family history of premature CAD  Z82.49        RECOMMENDATIONS: Joshua Stephenson is a 65 y.o. male whose past medical history and cardiac risk factors include: Benign essential hypertension, former smoker (69-pack-years hx of smoking), COPD, family history of premature CAD, aortic atherosclerosis, mild coronary artery calcification.  Coronary atherosclerosis due to calcified coronary lesion Total CAC 19. Minimal nonobstructive CAD based on the recent coronary CTA. Denies angina pectoris. Dyspnea on exertion remains relatively stable-currently being worked up from pulmonary standpoint. Continue aspirin and statin therapy Continue Toprol-XL.  Atherosclerosis of aorta (HCC) Continue aspirin and statin therapy. Educated on importance of secondary prevention.  Dyspnea on exertion Chronic and stable Has establish care with pulmonary medicine and is currently undergoing testing and has a follow-up appointment in December 2022. Relatively stable from a cardiovascular standpoint.  No additional testing warranted at this time.  Benign hypertension Office blood pressures within acceptable range. Patient does not check his blood pressures at home. Low-salt diet reemphasized  Former smoker Educated on the importance of continued smoking cessation.  FINAL MEDICATION LIST END OF ENCOUNTER: No orders of the defined types were placed in this encounter.    Current Outpatient Medications:    albuterol (VENTOLIN HFA) 108 (90 Base) MCG/ACT inhaler, Inhale 1-2 puffs into the lungs  every 6 (six) hours as needed for wheezing or shortness of breath., Disp: 24 g, Rfl: 2   aspirin EC 81 MG tablet, Take 1 tablet (81 mg total) by mouth daily. Swallow whole., Disp: 90 tablet, Rfl: 3   atorvastatin (LIPITOR) 20 MG tablet, TAKE 1 TABLET BY MOUTH EVERYDAY AT BEDTIME, Disp: 90 tablet, Rfl: 0   hydrochlorothiazide (HYDRODIURIL) 25 MG tablet, TAKE 1 TABLET (25 MG TOTAL) BY MOUTH IN THE MORNING., Disp: 90 tablet, Rfl: 0   metoprolol succinate (TOPROL-XL) 100 MG 24 hr tablet, Take 1 tablet (100 mg total) by mouth daily. Take with or immediately following a meal., Disp: 90 tablet, Rfl: 0  Orders Placed This Encounter  Procedures   EKG 12-Lead    There are  no Patient Instructions on file for this visit.  --Continue cardiac medications as reconciled in final medication list. --Return in about 1 year (around 06/10/2022) for Follow up, Coronary artery calcification. Or sooner if needed. --Continue follow-up with your primary care physician regarding the management of your other chronic comorbid conditions.  Patient's questions and concerns were addressed to his satisfaction. He voices understanding of the instructions provided during this encounter.   This note was created using a voice recognition software as a result there may be grammatical errors inadvertently enclosed that do not reflect the nature of this encounter. Every attempt is made to correct such errors.  Joshua Stephenson, [place], [facility]  Pager: [phone] Office: [phone]

## 2021-06-11 ENCOUNTER — Telehealth: Payer: Self-pay | Admitting: Internal Medicine

## 2021-06-11 NOTE — Telephone Encounter (Signed)
Fax received from Dr. Consuella Lose to perform a Cervical Fusion under General Anesthesia on patient.  Patient needs surgery clearance. Patient was just seen 04/30/2021 by Tammy Parrett. Office protocol is a risk assessment can be sent to surgeon if patient has been seen in 60 days or less.   Sending to [facility] for risk assessment or recommendations if patient needs to be seen in office prior to surgical procedure.

## 2021-06-12 ENCOUNTER — Ambulatory Visit: Payer: Medicare HMO | Admitting: Cardiology

## 2021-06-12 NOTE — Telephone Encounter (Signed)
My note on 04/30/21 addresses this

## 2021-06-13 NOTE — Telephone Encounter (Signed)
OV notes from 04/30/2021 and form faxed to [facility] for surgical clearance. Confirmation received. Nothing further needed at this time.

## 2021-06-18 ENCOUNTER — Other Ambulatory Visit: Payer: Self-pay | Admitting: Neurosurgery

## 2021-06-18 ENCOUNTER — Encounter: Payer: Self-pay | Admitting: Acute Care

## 2021-06-18 ENCOUNTER — Telehealth (INDEPENDENT_AMBULATORY_CARE_PROVIDER_SITE_OTHER): Payer: Medicare HMO | Admitting: Acute Care

## 2021-06-18 DIAGNOSIS — F17210 Nicotine dependence, cigarettes, uncomplicated: Secondary | ICD-10-CM | POA: Diagnosis not present

## 2021-06-18 NOTE — Progress Notes (Signed)
Virtual Visit via Video Note  I connected with Joshua Stephenson on 06/18/21 at 11:00 AM EDT by a video enabled telemedicine application and verified that I am speaking with the correct person using two identifiers.  Location: Patient: At home Provider: [address], [place], [place], [address]    I discussed the limitations of evaluation and management by telemedicine and the availability of in person appointments. The patient expressed understanding and agreed to proceed.  Shared Decision Making Visit Lung Cancer Screening Program [phone])   Eligibility: Age 42 y.o. Pack Years Smoking History Calculation 44 pack year smoking history (# packs/per year x # years smoked) Recent History of coughing up blood  no Unexplained weight loss? no ( >Than 15 pounds within the last 6 months ) Prior History Lung / other cancer no (Diagnosis within the last 5 years already requiring surveillance chest CT Scans). Smoking Status Current Smoker Former Smokers: Years since quit: NA  Quit Date: NA  Visit Components: Discussion included one or more decision making aids. yes Discussion included risk/benefits of screening. yes Discussion included potential follow up diagnostic testing for abnormal scans. yes Discussion included meaning and risk of over diagnosis. yes Discussion included meaning and risk of False Positives. yes Discussion included meaning of total radiation exposure. yes  Counseling Included: Importance of adherence to annual lung cancer LDCT screening. yes Impact of comorbidities on ability to participate in the program. yes Ability and willingness to under diagnostic treatment. yes  Smoking Cessation Counseling: Current Smokers:  Discussed importance of smoking cessation. yes Information about tobacco cessation classes and interventions provided to patient. yes Patient provided with "ticket" for LDCT Scan. yes Symptomatic Patient. no  Counseling NA Diagnosis  Code: Tobacco Use Z72.0 Asymptomatic Patient yes  Counseling (Intermediate counseling: > three minutes counseling) C9470 Former Smokers:  Discussed the importance of maintaining cigarette abstinence. yes Diagnosis Code: Personal History of Nicotine Dependence. J62.836 Information about tobacco cessation classes and interventions provided to patient. Yes Patient provided with "ticket" for LDCT Scan. yes Written Order for Lung Cancer Screening with LDCT placed in Epic. Yes (CT Chest Lung Cancer Screening Low Dose W/O CM) [id number] Z12.2-Screening of respiratory organs Z87.891-Personal history of nicotine dependence   I have spent 25 minutes of face to face time with [name] discussing the risks and benefits of lung cancer screening. We viewed a power point together that explained in detail the above noted topics. We paused at intervals to allow for questions to be asked and answered to ensure understanding.We discussed that the single most powerful action that he can take to decrease his risk of developing lung cancer is to quit smoking. We discussed whether or not he is ready to commit to setting a quit date. We discussed options for tools to aid in quitting smoking including nicotine replacement therapy, non-nicotine medications, support groups, Quit Smart classes, and behavior modification. We discussed that often times setting smaller, more achievable goals, such as eliminating 1 cigarette a day for a week and then 2 cigarettes a day for a week can be helpful in slowly decreasing the number of cigarettes smoked. This allows for a sense of accomplishment as well as providing a clinical benefit. I gave him the " Be Stronger Than Your Excuses" card with contact information for community resources, classes, free nicotine replacement therapy, and access to mobile apps, text messaging, and on-line smoking cessation help. I have also given him my card and contact information in the event he  needs  to contact me. We discussed the time and location of the scan, and that either Doroteo Glassman RN or I will call with the results within 24-48 hours of receiving them. I have offered him  a copy of the power point we viewed  as a resource in the event they need reinforcement of the concepts we discussed today in the office. The patient verbalized understanding of all of  the above and had no further questions upon leaving the office. They have my contact information in the event they have any further questions.  I spent 3 minutes counseling on smoking cessation and the health risks of continued tobacco abuse.  I explained to the patient that there has been a high incidence of coronary artery disease noted on these exams. I explained that this is a non-gated exam therefore degree or severity cannot be determined. This patient is on statin therapy. I have asked the patient to follow-up with their PCP regarding any incidental finding of coronary artery disease and management with diet or medication as their PCP  feels is clinically indicated. The patient verbalized understanding of the above and had no further questions upon completion of the visit.     Magdalen Spatz, NP 06/18/2021

## 2021-06-18 NOTE — Patient Instructions (Signed)
Thank you for participating in the [facility]. It was our pleasure to meet you today. We will call you with the results of your scan within the next few days. Your scan will be assigned a Lung RADS category score by the physicians reading the scans.  This Lung RADS score determines follow up scanning.  See below for description of categories, and follow up screening recommendations. We will be in touch to schedule your follow up screening annually or based on recommendations of our providers. We will fax a copy of your scan results to your Primary Care Physician, or the physician who referred you to the program, to ensure they have the results. Please call the office if you have any questions or concerns regarding your scanning experience or results.  Our office number is [phone]. Please speak with Denise Phelps, RN. She is our Lung Cancer Screening RN. If she is unavailable when you call, please have the office staff send her a message. She will return your call at her earliest convenience. Remember, if your scan is normal, we will scan you annually as long as you continue to meet the criteria for the program. (Age 55-77, Current smoker or smoker who has quit within the last 15 years). If you are a smoker, remember, quitting is the single most powerful action that you can take to decrease your risk of lung cancer and other pulmonary, breathing related problems. We know quitting is hard, and we are here to help.  Please let us know if there is anything we can do to help you meet your goal of quitting. If you are a former smoker, congratulations. We are proud of you! Remain smoke free! Remember you can refer friends or family members through the number above.  We will screen them to make sure they meet criteria for the program. Thank you for helping us take better care of you by participating in Lung Screening.  Lung RADS Categories:  Lung RADS 1: no nodules  or definitely non-concerning nodules.  Recommendation is for a repeat annual scan in 12 months.  Lung RADS 2:  nodules that are non-concerning in appearance and behavior with a very low likelihood of becoming an active cancer. Recommendation is for a repeat annual scan in 12 months.  Lung RADS 3: nodules that are probably non-concerning , includes nodules with a low likelihood of becoming an active cancer.  Recommendation is for a 6-month repeat screening scan. Often noted after an upper respiratory illness. We will be in touch to make sure you have no questions, and to schedule your 6-month scan.  Lung RADS 4 A: nodules with concerning findings, recommendation is most often for a follow up scan in 3 months or additional testing based on our provider's assessment of the scan. We will be in touch to make sure you have no questions and to schedule the recommended 3 month follow up scan.  Lung RADS 4 B:  indicates findings that are concerning. We will be in touch with you to schedule additional diagnostic testing based on our provider's  assessment of the scan.   

## 2021-06-19 ENCOUNTER — Ambulatory Visit
Admission: RE | Admit: 2021-06-19 | Discharge: 2021-06-19 | Disposition: A | Discharge status: Home / Self Care | Payer: Medicare HMO | Source: Ambulatory Visit | Attending: Acute Care | Admitting: Acute Care

## 2021-06-19 DIAGNOSIS — F17210 Nicotine dependence, cigarettes, uncomplicated: Secondary | ICD-10-CM

## 2021-06-19 DIAGNOSIS — Z87891 Personal history of nicotine dependence: Secondary | ICD-10-CM

## 2021-06-23 DIAGNOSIS — Z720 Tobacco use: Secondary | ICD-10-CM | POA: Diagnosis not present

## 2021-06-23 DIAGNOSIS — J449 Chronic obstructive pulmonary disease, unspecified: Secondary | ICD-10-CM | POA: Diagnosis not present

## 2021-06-23 DIAGNOSIS — G4733 Obstructive sleep apnea (adult) (pediatric): Secondary | ICD-10-CM | POA: Diagnosis not present

## 2021-06-23 DIAGNOSIS — G9589 Other specified diseases of spinal cord: Secondary | ICD-10-CM | POA: Diagnosis not present

## 2021-06-23 DIAGNOSIS — Z1331 Encounter for screening for depression: Secondary | ICD-10-CM | POA: Diagnosis not present

## 2021-06-23 DIAGNOSIS — Z1339 Encounter for screening examination for other mental health and behavioral disorders: Secondary | ICD-10-CM | POA: Diagnosis not present

## 2021-06-23 DIAGNOSIS — I10 Essential (primary) hypertension: Secondary | ICD-10-CM | POA: Diagnosis not present

## 2021-06-23 DIAGNOSIS — M316 Other giant cell arteritis: Secondary | ICD-10-CM | POA: Diagnosis not present

## 2021-06-23 DIAGNOSIS — I25118 Atherosclerotic heart disease of native coronary artery with other forms of angina pectoris: Secondary | ICD-10-CM | POA: Diagnosis not present

## 2021-06-23 DIAGNOSIS — R0782 Intercostal pain: Secondary | ICD-10-CM | POA: Diagnosis not present

## 2021-06-23 DIAGNOSIS — Z7189 Other specified counseling: Secondary | ICD-10-CM | POA: Diagnosis not present

## 2021-06-23 DIAGNOSIS — I700 Atherosclerosis of aorta: Secondary | ICD-10-CM | POA: Diagnosis not present

## 2021-06-24 ENCOUNTER — Ambulatory Visit (AMBULATORY_SURGERY_CENTER): Payer: Medicare HMO | Admitting: *Deleted

## 2021-06-24 ENCOUNTER — Other Ambulatory Visit: Payer: Self-pay

## 2021-06-24 VITALS — Ht 67.0 in | Wt 176.0 lb

## 2021-06-24 DIAGNOSIS — Z1211 Encounter for screening for malignant neoplasm of colon: Secondary | ICD-10-CM

## 2021-06-24 MED ORDER — NA SULFATE-K SULFATE-MG SULF 17.5-3.13-1.6 GM/177ML PO SOLN: 1.0000 | Freq: Once | ORAL | 0 refills | Status: AC

## 2021-06-24 NOTE — Progress Notes (Signed)
Pt verified name, DOB, address and insurance during PV today.  Pt mailed instruction packet of Emmi video, copy of consent form to read and not return, and instructions.  PV completed over the phone.  Pt encouraged to call with questions or issues.  My Chart instructions to pt as well    No egg or soy allergy known to patient  No issues known to pt with past sedation with any surgeries or procedures Patient denies ever being told they had issues or difficulty with intubation  No FH of Malignant Hyperthermia- pt denies  Pt is not on diet pills Pt is not on  home 02  Pt is not on blood thinners  Pt denies issues with constipation  No A fib or A flutter  Pt is not vaccinated  for Covid   Due to the COVID-19 pandemic we are asking patients to follow certain guidelines.  Pt aware of COVID protocols and [facility] guidelines

## 2021-06-26 ENCOUNTER — Telehealth: Payer: Self-pay | Admitting: Internal Medicine

## 2021-06-26 DIAGNOSIS — G4733 Obstructive sleep apnea (adult) (pediatric): Secondary | ICD-10-CM

## 2021-06-26 NOTE — Telephone Encounter (Signed)
Pts PCP advised him that it would be easier for him to get the cpap approved via his insurance instead of the mouthpiece.  Pt stated that the mouthpiece was going to cost him $525 up front and he does not have this to spend.  Pt would like to proceed with getting the cpap set up.  CY please advise. Thanks   Pt does not have a DME company.

## 2021-06-26 NOTE — Telephone Encounter (Signed)
Based on HST in May Order- new DME, new CPAP auto 5-15, mask of choice, humidifier, supplies, AirView/ card  He needs to discuss supply chain delays wiwth DME. He will need office f/u here in 31-90 days after he gets his machine, per insurance regs.

## 2021-06-27 NOTE — Telephone Encounter (Signed)
I have called and spoke with pt and he is aware of the order placed for the cpap.  He has not used cpap before so this is a new start.  Nothing further is needed.  Pt is aware that he will need to have an ov in 31-90 days with CY after he starts with his cpap.

## 2021-06-30 ENCOUNTER — Other Ambulatory Visit: Payer: Self-pay | Admitting: Neurosurgery

## 2021-07-03 ENCOUNTER — Telehealth: Payer: Self-pay | Admitting: Cardiology

## 2021-07-03 DIAGNOSIS — R079 Chest pain, unspecified: Secondary | ICD-10-CM | POA: Diagnosis not present

## 2021-07-03 NOTE — Telephone Encounter (Signed)
PRE-OPERATIVE CARDIAC RISK ASSESSMENT  07/03/21  Patient's name: Joshua Stephenson.   MRN: [id number].    DOB: 1956/08/12  Primary care provider: Sueanne Margarita, DO. Referral provider: Dr. Valeta Harms  Joshua Stephenson is a 65 y.o. male for which I have been requested to assess estimated cardiac risk for planned non-cardiac surgery.  Patient is planning to undergo cervical fusion in November 2022.  Our office has been asked to weigh in with regards to his preoperative risk stratification.  Spoke to the patient over the phone today.  He has not been experiencing any chest pain to suggest angina pectoris.  He denies any symptoms of heart failure.  No known history of dysrhythmias.  No change in physical status since last office visit.  Has undergone an echocardiogram and coronary CTA.  Results noted below for further reference.  Patient presents acceptable cardiac risk for the planned noncardiac surgery.  Using the preoperative risk assessment guidelines as published by the [employer] of Cardiology, the patient presents acceptable/low cardiac risk for the planned noncardiac surgery because there is no history of recent unstable angina pectoris or myocardial infarction, no signs or symptoms of decompensated CHF, no unaddressed complex dysrhythmia, no significant aortic valvular stenosis, and recently has undergone ischemic work-up including a coronary CTA.  This preoperative risk assessment is a tool to assist the surgeon in estimating the cardiac risk for the proposed upcoming noncardiac surgery.  The shared decision to proceed with surgery will be ultimately at the discretion of the patient after the surgical risks, benefits, and alternatives have been discussed amongst the patient and his surgical team.  CARDIAC DATABASE: EKG: 06/10/2021: Normal sinus rhythm, 82 bpm, LAE, ST-T changes suggestive of early repolarization, without underlying injury pattern.    Echocardiogram: 10/10/2020: Normal LV systolic function with visual EF 55-60%. Left ventricle cavity is normal in size. Moderate left ventricular hypertrophy. Normal global wall motion. No significant valvular heart disease. No prior study for comparison   Stress Testing: No results found for this or any previous visit from the past 1095 days.   CCTA:  10/23/2020:  Total calcium score: 19 AU, which places the patient in the 63th percentile for age and sex matched control. Normal coronary origin with right dominance. CAD-RADS = 1. Minimal stenosis in distal LAD, Minimal stenosis in ostial LCX. Aortic atherosclerosis.  Sincerely,   Rex Kras, [place], [facility]  Pager: [phone] Office: [phone]

## 2021-07-08 ENCOUNTER — Encounter: Payer: Self-pay | Admitting: Internal Medicine

## 2021-07-08 ENCOUNTER — Ambulatory Visit (AMBULATORY_SURGERY_CENTER): Payer: Medicare HMO | Admitting: Internal Medicine

## 2021-07-08 VITALS — BP 138/78 | HR 64 | Temp 98.6°F | Resp 14 | Ht 60.75 in | Wt 176.0 lb

## 2021-07-08 DIAGNOSIS — I10 Essential (primary) hypertension: Secondary | ICD-10-CM | POA: Diagnosis not present

## 2021-07-08 DIAGNOSIS — G4730 Sleep apnea, unspecified: Secondary | ICD-10-CM | POA: Diagnosis not present

## 2021-07-08 DIAGNOSIS — D125 Benign neoplasm of sigmoid colon: Secondary | ICD-10-CM | POA: Diagnosis not present

## 2021-07-08 DIAGNOSIS — Z1211 Encounter for screening for malignant neoplasm of colon: Secondary | ICD-10-CM

## 2021-07-08 DIAGNOSIS — I2510 Atherosclerotic heart disease of native coronary artery without angina pectoris: Secondary | ICD-10-CM | POA: Diagnosis not present

## 2021-07-08 DIAGNOSIS — F419 Anxiety disorder, unspecified: Secondary | ICD-10-CM | POA: Diagnosis not present

## 2021-07-08 DIAGNOSIS — Z860101 Personal history of adenomatous and serrated colon polyps: Secondary | ICD-10-CM

## 2021-07-08 DIAGNOSIS — J449 Chronic obstructive pulmonary disease, unspecified: Secondary | ICD-10-CM | POA: Diagnosis not present

## 2021-07-08 DIAGNOSIS — F32A Depression, unspecified: Secondary | ICD-10-CM | POA: Diagnosis not present

## 2021-07-08 DIAGNOSIS — Z86010 Personal history of colonic polyps: Secondary | ICD-10-CM

## 2021-07-08 HISTORY — DX: Personal history of colonic polyps: Z86.010

## 2021-07-08 HISTORY — DX: Personal history of adenomatous and serrated colon polyps: Z86.0101

## 2021-07-08 MED ORDER — SODIUM CHLORIDE 0.9 % IV SOLN: 500.0000 mL | INTRAVENOUS | Status: DC

## 2021-07-08 NOTE — Progress Notes (Signed)
Pt Drowsy. VSS. To PACU, report to RN. No anesthetic complications noted.  

## 2021-07-08 NOTE — Patient Instructions (Addendum)
I removed 2 tiny polyps that look benign  I will let you know pathology results and when to have another routine colonoscopy by mail and/or My Chart.  You also have a condition called diverticulosis - common and not usually a problem. Please read the handout provided.  I appreciate the opportunity to care for you. Gatha Mayer, MD, [facility]   PLease see handouts given to you on Polyps and Diverticulosis.  YOU HAD AN ENDOSCOPIC PROCEDURE TODAY AT [facility]:   Refer to the procedure report that was given to you for any specific questions about what was found during the examination.  If the procedure report does not answer your questions, please call your gastroenterologist to clarify.  If you requested that your care partner not be given the details of your procedure findings, then the procedure report has been included in a sealed envelope for you to review at your convenience later.  YOU SHOULD EXPECT: Some feelings of bloating in the abdomen. Passage of more gas than usual.  Walking can help get rid of the air that was put into your GI tract during the procedure and reduce the bloating. If you had a lower endoscopy (such as a colonoscopy or flexible sigmoidoscopy) you may notice spotting of blood in your stool or on the toilet paper. If you underwent a bowel prep for your procedure, you may not have a normal bowel movement for a few days.  Please Note:  You might notice some irritation and congestion in your nose or some drainage.  This is from the oxygen used during your procedure.  There is no need for concern and it should clear up in a day or so.  SYMPTOMS TO REPORT IMMEDIATELY:  Following lower endoscopy (colonoscopy or flexible sigmoidoscopy):  Excessive amounts of blood in the stool  Significant tenderness or worsening of abdominal pains  Swelling of the abdomen that is new, acute  Fever of 100F or higher   For urgent or emergent issues, a gastroenterologist can  be reached at any hour by calling [phone]. Do not use MyChart messaging for urgent concerns.    DIET:  We do recommend a small meal at first, but then you may proceed to your regular diet.  Drink plenty of fluids but you should avoid alcoholic beverages for 24 hours.  ACTIVITY:  You should plan to take it easy for the rest of today and you should NOT DRIVE or use heavy machinery until tomorrow (because of the sedation medicines used during the test).    FOLLOW UP: Our staff will call the number listed on your records 48-72 hours following your procedure to check on you and address any questions or concerns that you may have regarding the information given to you following your procedure. If we do not reach you, we will leave a message.  We will attempt to reach you two times.  During this call, we will ask if you have developed any symptoms of COVID 19. If you develop any symptoms (ie: fever, flu-like symptoms, shortness of breath, cough etc.) before then, please call [phone].  If you test positive for Covid 19 in the 2 weeks post procedure, please call and report this information to [place].    If any biopsies were taken you will be contacted by phone or by letter within the next 1-3 weeks.  Please call us at [phone] if you have not heard about the biopsies in 3 weeks.    SIGNATURES/CONFIDENTIALITY:  You and/or your care partner have signed paperwork which will be entered into your electronic medical record.  These signatures attest to the fact that that the information above on your After Visit Summary has been reviewed and is understood.  Full responsibility of the confidentiality of this discharge information lies with you and/or your care-partner.

## 2021-07-08 NOTE — Progress Notes (Signed)
[facility] History and Physical   Primary Care Physician:  Sueanne Margarita, DO   Reason for Procedure:   Colon cancer screening  Plan:    colonoscopy     HPI: Joshua Stephenson is a 65 y.o. 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 Authorizing Provider  albuterol (VENTOLIN HFA) 108 (90 Base) MCG/ACT inhaler Inhale 1-2 puffs into the lungs every 6 (six) hours as needed for wheezing or shortness of breath. 05/22/21  Yes Parrett, Tammy S, NP  aspirin EC 81 MG tablet Take 1 tablet (81 mg total) by mouth daily. Swallow whole. 10/28/20  Yes Tolia, Sunit, DO  atorvastatin (LIPITOR) 20 MG tablet TAKE 1 TABLET BY MOUTH EVERYDAY AT BEDTIME 04/21/21  Yes Tolia, Sunit, DO  metoprolol succinate (TOPROL-XL) 100 MG 24 hr tablet Take 1 tablet (100 mg total) by mouth daily. Take with or immediately following a meal. 10/28/20 07/08/21 Yes Tolia, Sunit, DO  hydrochlorothiazide (HYDRODIURIL) 25 MG tablet TAKE 1 TABLET (25 MG TOTAL) BY MOUTH IN THE MORNING. Patient not taking: Reported on 06/24/2021 01/20/21 06/24/21  Rex Kras, DO  dicyclomine (BENTYL) 20 MG tablet Take 1 tablet (20 mg total) by mouth 4 (four) times daily -  before meals and at bedtime. As needed for cramps and diarrhea 04/04/18 02/25/20  Gatha Mayer, MD  escitalopram (LEXAPRO) 10 MG tablet Take 1 tablet (10 mg total) by mouth daily. 04/26/18 02/25/20  Nafziger, Tommi Rumps, NP  triazolam (HALCION) 0.25 MG tablet  Take 1 tab by mouth 1 hour prior to procedure with very light food, do not drive motor vehicle. 06/23/18 02/25/20  Magnus Sinning, MD    Current Outpatient Medications  Medication Sig Dispense Refill   albuterol (VENTOLIN HFA) 108 (90 Base) MCG/ACT inhaler Inhale 1-2 puffs into the lungs every 6 (six) hours as needed for wheezing or shortness of breath. 24 g 2   aspirin EC 81 MG tablet Take 1 tablet (81 mg total) by mouth daily. Swallow whole. 90 tablet 3   atorvastatin (LIPITOR) 20 MG tablet TAKE 1 TABLET BY MOUTH EVERYDAY AT BEDTIME 90 tablet 0   metoprolol succinate (TOPROL-XL) 100 MG 24 hr tablet Take 1 tablet (100 mg total) by mouth daily. Take with or immediately following a meal. 90 tablet 0   hydrochlorothiazide (HYDRODIURIL) 25 MG tablet TAKE 1 TABLET (25 MG TOTAL) BY MOUTH IN THE MORNING. (Patient not taking: Reported on 06/24/2021) 90 tablet 0   Current Facility-Administered Medications  Medication Dose Route Frequency Provider Last Rate Last Admin   0.9 %  sodium chloride infusion  500 mL Intravenous Continuous Gatha Mayer, MD        Allergies as of 07/08/2021   (No Known Allergies)    Family History  Problem Relation Age of Onset   Breast cancer Mother    Hypertension Mother    Diabetes  Father    Heart disease Father    Hypertension Father    Cancer Sister        type unknown   Breast cancer Sister    Cancer Brother        oral cancer    Cancer Brother        oral cancer    Breast cancer Niece    Breast cancer Niece    Colon cancer Neg Hx    Esophageal cancer Neg Hx    Rectal cancer Neg Hx    Stomach cancer Neg Hx    Colon polyps Neg Hx     Social History   Socioeconomic History   Marital status: Married    Spouse name: Not on file   Number of children: 1    Years of education: 12    Highest education level: Not on file  Occupational History   Occupation: Psychologist, occupational: [employer]  Tobacco Use   Smoking status: Every Day    Packs/day:  0.50    Years: 20.00    Pack years: 10.00    Types: Cigarettes    Last attempt to quit: 12/2019    Years since quitting: 1.5   Smokeless tobacco: Never   Tobacco comments:    Back smoking 1ppd -04/30/2021  Vaping Use   Vaping Use: Never used  Substance and Sexual Activity   Alcohol use: Not Currently   Drug use: No   Sexual activity: Yes    Birth control/protection: None  Other Topics Concern   Not on file  Social History Narrative   Married   1 grown daughter (prior relationship I think) lives [place] of [address]    He likes to be outside.    Works at [employer] for [employer]   Former smoker, no EtOH and no caffeine or drugs       Review of Systems:  All other review of systems negative except as mentioned in the HPI.  Physical Exam: Vital signs BP 127/64   Pulse 72   Temp 98.6 F (37 C) (Temporal)   Ht 5' 0.75" (1.543 m)   Wt 176 lb (79.8 kg)   SpO2 100%   BMI 33.53 kg/m   General:   Alert,  Well-developed, well-nourished, pleasant and cooperative in NAD Lungs:  Clear throughout to auscultation.   Heart:  Regular rate and rhythm; no murmurs, clicks, rubs,  or gallops. Abdomen:  Soft, nontender and nondistended. Normal bowel sounds.   Neuro/Psych:  Alert and cooperative. Normal mood and affect. A and O x 3   @Aracelys Glade  Simonne Maffucci, MD, [facility] [phone] (pager) 07/08/2021 11:09 AM@

## 2021-07-08 NOTE — Op Note (Signed)
[place] Patient Name: Joshua Stephenson Procedure Date: 07/08/2021 11:07 AM MRN: [id number] Endoscopist: Gatha Mayer , MD Age: 65 Referring MD:  Date of Birth: Jan 31, 1956 Gender: Male Account #: [id number] Procedure:                Colonoscopy Indications:              Screening for colorectal malignant neoplasm, Last                            colonoscopy: 2011 Medicines:                Propofol per Anesthesia, Monitored Anesthesia Care Procedure:                Pre-Anesthesia Assessment:                           - Prior to the procedure, a History and Physical                            was performed, and patient medications and                            allergies were reviewed. The patient's tolerance of                            previous anesthesia was also reviewed. The risks                            and benefits of the procedure and the sedation                            options and risks were discussed with the patient.                            All questions were answered, and informed consent                            was obtained. Prior Anticoagulants: The patient has                            taken no previous anticoagulant or antiplatelet                            agents. ASA Grade Assessment: III - A patient with                            severe systemic disease. After reviewing the risks                            and benefits, the patient was deemed in                            satisfactory condition to undergo the procedure.  After obtaining informed consent, the colonoscope                            was passed under direct vision. Throughout the                            procedure, the patient's blood pressure, pulse, and                            oxygen saturations were monitored continuously. The                            Olympus PCF-H190DL (#[id number]) Colonoscope was                            introduced  through the anus and advanced to the the                            cecum, identified by appendiceal orifice and                            ileocecal valve. The colonoscopy was performed                            without difficulty. The patient tolerated the                            procedure well. The quality of the bowel                            preparation was adequate. The ileocecal valve,                            appendiceal orifice, and rectum were photographed.                            The bowel preparation used was SUPREP via split                            dose instruction. Scope In: 11:20:15 AM Scope Out: 11:45:14 AM Scope Withdrawal Time: 0 hours 21 minutes 17 seconds  Total Procedure Duration: 0 hours 24 minutes 59 seconds  Findings:                 The perianal and digital rectal examinations were                            normal.                           Two sessile polyps were found in the sigmoid colon.                            The polyps were diminutive in size. These polyps  were removed with a cold snare. Resection and                            retrieval were complete. Verification of patient                            identification for the specimen was done. Estimated                            blood loss was minimal.                           Multiple small and large-mouthed diverticula were                            found in the sigmoid colon. There was narrowing of                            the colon in association with the diverticular                            opening.                           The exam was otherwise without abnormality on                            direct and retroflexion views. Complications:            No immediate complications. Estimated Blood Loss:     Estimated blood loss was minimal. Impression:               - Two diminutive polyps in the sigmoid colon,                            removed with  a cold snare. Resected and retrieved.                           - Severe diverticulosis in the sigmoid colon. There                            was narrowing of the colon in association with the                            diverticular opening.                           - The examination was otherwise normal on direct                            and retroflexion views. Recommendation:           - Patient has a contact number available for                            emergencies. The signs and symptoms  of potential                            delayed complications were discussed with the                            patient. Return to normal activities tomorrow.                            Written discharge instructions were provided to the                            patient.                           - Resume previous diet.                           - Continue present medications.                           - Repeat colonoscopy is recommended. The                            colonoscopy date will be determined after pathology                            results from today's exam become available for                            review. Gatha Mayer, MD 07/08/2021 11:52:47 AM This report has been signed electronically.

## 2021-07-08 NOTE — Progress Notes (Signed)
Pt's states no medical or surgical changes since previsit or office visit. 

## 2021-07-10 ENCOUNTER — Telehealth: Payer: Self-pay

## 2021-07-10 NOTE — Telephone Encounter (Signed)
  Follow up Call-  Call back number 07/08/2021  Post procedure Call Back phone  # [phone]  Permission to leave phone message Yes  Some recent data might be hidden     Patient questions:  Do you have a fever, pain , or abdominal swelling? No. Pain Score  0 *  Have you tolerated food without any problems? Yes.    Have you been able to return to your normal activities? Yes.    Do you have any questions about your discharge instructions: Diet   No. Medications  No. Follow up visit  No.  Do you have questions or concerns about your Care? Yes.   Pt. Inquired if pathology reports had been received, and I told him not yet, but we would notify him when they are completed.  Pt. Reports he hasn't had a BM since his procedure.  Told pt. That was normal, and to call if he had any further questions or concerns. Actions: * If pain score is 4 or above: No action needed, pain <4.

## 2021-07-10 NOTE — Progress Notes (Signed)
Please call patient and let them  know their  low dose Ct was read as a Lung RADS 2: nodules that are benign in appearance and behavior with a very low likelihood of becoming a clinically active cancer due to size or lack of growth. Recommendation per radiology is for a repeat LDCT in 12 months. .Please let them  know we will order and schedule their  annual screening scan for 05/2022. Please let them  know there was notation of CAD on their  scan.  Please remind the patient  that this is a non-gated exam therefore degree or severity of disease  cannot be determined. Please have them  follow up with their PCP regarding potential risk factor modification, dietary therapy or pharmacologic therapy if clinically indicated. Pt.  is  currently on statin therapy. Please place order for annual  screening scan for  05/2022 and fax results to PCP. Thanks so much.  Langley Gauss, there was notation of multifocal patchy areas of ground-glass attenuation and interstitial reticulation noted bilaterally. Findings likely reflect sequelae of inflammation.  Can you please get him scheduled with any of the MD's as a consult  to follow up what may be ILD on this scan? He will need an HRCT, but they can order that after they see him. Thanks so much

## 2021-07-10 NOTE — Telephone Encounter (Signed)
Attempted f/u call. No answer, left VM. 

## 2021-07-17 ENCOUNTER — Encounter: Payer: Self-pay | Admitting: Internal Medicine

## 2021-07-17 ENCOUNTER — Other Ambulatory Visit: Payer: Self-pay | Admitting: *Deleted

## 2021-07-17 DIAGNOSIS — F17210 Nicotine dependence, cigarettes, uncomplicated: Secondary | ICD-10-CM

## 2021-07-17 DIAGNOSIS — Z87891 Personal history of nicotine dependence: Secondary | ICD-10-CM

## 2021-07-17 DIAGNOSIS — Z86010 Personal history of colonic polyps: Secondary | ICD-10-CM

## 2021-07-24 ENCOUNTER — Ambulatory Visit (INDEPENDENT_AMBULATORY_CARE_PROVIDER_SITE_OTHER): Payer: Medicare HMO | Admitting: Pulmonary Disease

## 2021-07-24 ENCOUNTER — Other Ambulatory Visit: Payer: Self-pay

## 2021-07-24 ENCOUNTER — Encounter: Payer: Self-pay | Admitting: Pulmonary Disease

## 2021-07-24 VITALS — BP 116/80 | HR 71 | Temp 98.5°F | Ht 67.0 in | Wt 179.4 lb

## 2021-07-24 DIAGNOSIS — J8410 Pulmonary fibrosis, unspecified: Secondary | ICD-10-CM

## 2021-07-24 DIAGNOSIS — G4733 Obstructive sleep apnea (adult) (pediatric): Secondary | ICD-10-CM | POA: Diagnosis not present

## 2021-07-24 DIAGNOSIS — F17210 Nicotine dependence, cigarettes, uncomplicated: Secondary | ICD-10-CM

## 2021-07-24 DIAGNOSIS — J849 Interstitial pulmonary disease, unspecified: Secondary | ICD-10-CM

## 2021-07-24 DIAGNOSIS — F17200 Nicotine dependence, unspecified, uncomplicated: Secondary | ICD-10-CM

## 2021-07-24 NOTE — Progress Notes (Signed)
Joshua Stephenson    [id number]    1956/03/08  Primary Care Physician:Skakle, Liane Comber, DO  Referring Physician: Sueanne Margarita, [address] [place],  [place] [address]  Chief complaint: Consult for abnormal CT scan  HPI: Active smoker with history of emphysema, sleep apnea Referred here for abnormal screening CT of the chest He has dyspnea on exertion, denies any cough, sputum production, fevers, chills  He has history of sleep apnea and follows with Dr. Annamaria Boots.  At last visit he had been referred to Dr. Ron Parker for oral appliance Is scheduled for neck surgery by Dr. Kathyrn Sheriff next month  Pets: No pets Occupation: Retired Biochemist, clinical.  Disabled due to shoulder issues. Exposures: No mold, hot tub, Jacuzzi.  No feather pillows or comforter Smoking history: 45-pack-year smoker.  Continues to smoke half pack per day Travel history: No significant travel history Relevant family history: No family history of lung disease   Outpatient Encounter Medications as of 07/24/2021  Medication Sig   albuterol (VENTOLIN HFA) 108 (90 Base) MCG/ACT inhaler Inhale 1-2 puffs into the lungs every 6 (six) hours as needed for wheezing or shortness of breath.   aspirin EC 81 MG tablet Take 1 tablet (81 mg total) by mouth daily. Swallow whole.   atorvastatin (LIPITOR) 20 MG tablet TAKE 1 TABLET BY MOUTH EVERYDAY AT BEDTIME   metoprolol succinate (TOPROL-XL) 100 MG 24 hr tablet Take 1 tablet (100 mg total) by mouth daily. Take with or immediately following a meal.   [DISCONTINUED] dicyclomine (BENTYL) 20 MG tablet Take 1 tablet (20 mg total) by mouth 4 (four) times daily -  before meals and at bedtime. As needed for cramps and diarrhea   [DISCONTINUED] escitalopram (LEXAPRO) 10 MG tablet Take 1 tablet (10 mg total) by mouth daily.   [DISCONTINUED] hydrochlorothiazide (HYDRODIURIL) 25 MG tablet TAKE 1 TABLET (25 MG TOTAL) BY MOUTH IN THE MORNING. (Patient not taking: Reported on 06/24/2021)    [DISCONTINUED] triazolam (HALCION) 0.25 MG tablet Take 1 tab by mouth 1 hour prior to procedure with very light food, do not drive motor vehicle.   No facility-administered encounter medications on file as of 07/24/2021.    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   Coronary artery calcification    Depression    GERD (gastroesophageal reflux disease)    Hemorrhoids    Hx of adenomatous colonic polyps 07/08/2021   2 diminutive - repeat exam 2029   Hypertension    Sleep apnea    in process of obtaining cpap    Past Surgical History:  Procedure Laterality Date   COLONOSCOPY  2011   SHOULDER ARTHROSCOPY W/ ROTATOR CUFF REPAIR Bilateral 02/08/2018    Family History  Problem Relation Age of Onset   Breast cancer Mother    Hypertension Mother    Diabetes Father    Heart disease Father    Hypertension Father    Cancer Sister        type unknown   Breast cancer Sister    Cancer Brother        oral cancer    Cancer Brother        oral cancer    Breast cancer Niece    Breast cancer Niece  Colon cancer Neg Hx    Esophageal cancer Neg Hx    Rectal cancer Neg Hx    Stomach cancer Neg Hx    Colon polyps Neg Hx     Social History   Socioeconomic History   Marital status: Married    Spouse name: Not on file   Number of children: 1    Years of education: 12    Highest education level: Not on file  Occupational History   Occupation: Psychologist, occupational: [employer]  Tobacco Use   Smoking status: Every Day    Packs/day: 0.50    Years: 20.00    Pack years: 10.00    Types: Cigarettes    Last attempt to quit: 12/2019    Years since quitting: 1.5   Smokeless tobacco: Never   Tobacco comments:    1/2 pack 07/24/21  Vaping Use   Vaping Use: Never used  Substance and Sexual Activity   Alcohol use: Not Currently    Drug use: No   Sexual activity: Yes    Birth control/protection: None  Other Topics Concern   Not on file  Social History Narrative   Married   1 grown daughter (prior relationship I think) lives [place] [address]    He likes to be outside.    Works at [place] for [employer]   Former smoker, no EtOH and no caffeine or drugs      Social Determinants of Radio broadcast assistant Strain: Not on [employer] Insecurity: Not on file  Transportation Needs: Not on file  Physical Activity: Not on file  Stress: Not on file  Social Connections: Not on file  Intimate Partner Violence: Not on file    Review of systems: Review of Systems  Constitutional: Negative for fever and chills.  HENT: Negative.   Eyes: Negative for blurred vision.  Respiratory: as per HPI  Cardiovascular: Negative for chest pain and palpitations.  Gastrointestinal: Negative for vomiting, diarrhea, blood per rectum. Genitourinary: Negative for dysuria, urgency, frequency and hematuria.  Musculoskeletal: Negative for myalgias, back pain and joint pain.  Skin: Negative for itching and rash.  Neurological: Negative for dizziness, tremors, focal weakness, seizures and loss of consciousness.  Endo/Heme/Allergies: Negative for environmental allergies.  Psychiatric/Behavioral: Negative for depression, suicidal ideas and hallucinations.  All other systems reviewed and are negative.  Physical Exam: Blood pressure 116/80, pulse 71, temperature 98.5 F (36.9 C), temperature source Oral, height 5\' 7"  (1.702 m), weight 179 lb 6.4 oz (81.4 kg), SpO2 100 %. Gen:      No acute distress HEENT:  EOMI, sclera anicteric Neck:     No masses; no thyromegaly Lungs:    Clear to auscultation bilaterally; normal respiratory effort CV:         Regular rate and rhythm; no murmurs Abd:      + bowel sounds; soft, non-tender; no palpable masses, no distension Ext:    No edema; adequate peripheral perfusion Skin:       Warm and dry; no rash Neuro: alert and oriented x 3 Psych: normal mood and affect  Data Reviewed: Imaging: Screening CT chest 06/19/2021-centrilobular and paraseptal emphysema, patchy groundglass attenuation bilaterally.  I have reviewed the images personally  PFTs: 04/30/2021 FVC 3.17 [89%], FEV1 2.42 [88%], F/F 76 Obstructive airway disease, no bronchodilator response  Labs:  Assessment:  Abnormal CT I have reviewed the screening CT with mild peripheral groundglass opacities.  These appear upper  lobe predominant Likely related to ongoing smoking. We will get high-res CT for better evaluation Check baseline labs including CBC differential, IgE, alpha-1 antitrypsin levels and phenotype ANA, rheumatoid factor, CCP  Active smoker Discussed smoking cessation in detail with patient.  He is using nicotine gum and would like to try and quit on his own Offered referral to cessation clinic but he declined Time spent counseling-5 minutes.  Reassess at return visit  [facility] Referred for dental appliance Follows with Dr. Annamaria Boots  Plan/Recommendations: Labs High-res CT Smoking cessation  Marshell Garfinkel MD [facility] 07/24/2021, 4:14 PM  CC: Sueanne Margarita, DO

## 2021-07-24 NOTE — Patient Instructions (Addendum)
We will get some labs today including CBC differential, IgE, alpha-1 antitrypsin levels and phenotype Check ANA, rheumatoid factor, CCP Order high-resolution CT  Follow-up in 1 to 2 months

## 2021-07-25 ENCOUNTER — Telehealth: Payer: Self-pay | Admitting: Pulmonary Disease

## 2021-07-26 NOTE — Telephone Encounter (Signed)
Will forward to [place] and Dr. Vaughan Browner

## 2021-07-28 NOTE — Telephone Encounter (Signed)
I have spoken to lab and I am aware tried calling patient this morning and no answer left message with patient to call lab 07/28/2021 I.Frederico Hamman

## 2021-07-30 ENCOUNTER — Other Ambulatory Visit (INDEPENDENT_AMBULATORY_CARE_PROVIDER_SITE_OTHER): Payer: Medicare HMO

## 2021-07-30 DIAGNOSIS — J8410 Pulmonary fibrosis, unspecified: Secondary | ICD-10-CM

## 2021-07-30 LAB — CBC WITH DIFFERENTIAL/PLATELET
Basophils Absolute: 0 10*3/uL (ref 0.0–0.1)
Basophils Relative: 0.4 % (ref 0.0–3.0)
Eosinophils Absolute: 0.2 10*3/uL (ref 0.0–0.7)
Eosinophils Relative: 2.8 % (ref 0.0–5.0)
HCT: 44.3 % (ref 39.0–52.0)
Hemoglobin: 15.3 g/dL (ref 13.0–17.0)
Lymphocytes Relative: 38.2 % (ref 12.0–46.0)
Lymphs Abs: 2.3 10*3/uL (ref 0.7–4.0)
MCHC: 34.6 g/dL (ref 30.0–36.0)
MCV: 87.8 fl (ref 78.0–100.0)
Monocytes Absolute: 1.1 10*3/uL — ABNORMAL HIGH (ref 0.1–1.0)
Monocytes Relative: 18.8 % — ABNORMAL HIGH (ref 3.0–12.0)
Neutro Abs: 2.4 10*3/uL (ref 1.4–7.7)
Neutrophils Relative %: 39.8 % — ABNORMAL LOW (ref 43.0–77.0)
Platelets: 220 10*3/uL (ref 150.0–400.0)
RBC: 5.04 Mil/uL (ref 4.22–5.81)
RDW: 15.2 % (ref 11.5–15.5)
WBC: 5.6 10*3/uL (ref 4.0–10.5)

## 2021-07-30 NOTE — Progress Notes (Signed)
Surgical Instructions    Your procedure is scheduled on Thursday, November 17th.  Report to [facility] Main Entrance "A" at 10:30 A.M., then check in with the Admitting office.  Call this number if you have problems the morning of surgery:  [phone]   If you have any questions prior to your surgery date call [phone]: Open Monday-Friday 8am-4pm    Remember:  Do not eat or drink after midnight the night before your surgery     Take these medicines the morning of surgery with A SIP OF WATER Metoprolol Succinate (TOPROL-XL)  If needed: Albuterol inhaler - bring with you on day of surgery  Follow your surgeon's instructions on when to stop Aspirin.  If no instructions were given by your surgeon then you will need to call the office to get those instructions.    As of today, STOP taking Aleve, Naproxen, Ibuprofen, Motrin, Advil, Goody's, BC's, all herbal medications, fish oil, and all vitamins.   DAY OF SURGERY         Do not wear jewelry  Do not wear lotions, powders, colognes, or deodorant. Men may shave face and neck. Do not bring valuables to the hospital.              [facility] is not responsible for any belongings or valuables.  Do NOT Smoke (Tobacco/Vaping)  24 hours prior to your procedure  If you use a CPAP at night, you may bring your mask for your overnight stay.   Contacts, glasses, hearing aids, dentures or partials may not be worn into surgery, please bring cases for these belongings   For patients admitted to the hospital, discharge time will be determined by your treatment team.   Patients discharged the day of surgery will not be allowed to drive home, and someone needs to stay with them for 24 hours.  NO VISITORS WILL BE ALLOWED IN PRE-OP WHERE PATIENTS ARE PREPPED FOR SURGERY.  ONLY 1 SUPPORT PERSON MAY BE PRESENT IN THE WAITING ROOM WHILE YOU ARE IN SURGERY.  IF YOU ARE TO BE ADMITTED, ONCE YOU ARE IN YOUR ROOM YOU WILL BE ALLOWED TWO (2)  VISITORS. 1 (ONE) VISITOR MAY STAY OVERNIGHT BUT MUST ARRIVE TO THE ROOM BY 8pm.  Minor children may have two parents present. Special consideration for safety and communication needs will be reviewed on a case by case basis.  Special instructions:    Oral Hygiene is also important to reduce your risk of infection.  Remember - BRUSH YOUR TEETH THE MORNING OF SURGERY WITH YOUR REGULAR TOOTHPASTE   [facility]- Preparing For Surgery  Before surgery, you can play an important role. Because skin is not sterile, your skin needs to be as free of germs as possible. You can reduce the number of germs on your skin by washing with CHG (chlorahexidine gluconate) Soap before surgery.  CHG is an antiseptic cleaner which kills germs and bonds with the skin to continue killing germs even after washing.     Please do not use if you have an allergy to CHG or antibacterial soaps. If your skin becomes reddened/irritated stop using the CHG.  Do not shave (including legs and underarms) for at least 48 hours prior to first CHG shower. It is OK to shave your face.  Please follow these instructions carefully.     Shower the NIGHT BEFORE SURGERY and the MORNING OF SURGERY with CHG Soap.   If you chose to wash your hair, wash your hair first  as usual with your normal shampoo. After you shampoo, rinse your hair and body thoroughly to remove the shampoo.  Then [employer] and genitals (private parts) with your normal soap and rinse thoroughly to remove soap.  After that Use CHG Soap as you would any other liquid soap. You can apply CHG directly to the skin and wash gently with a scrungie or a clean washcloth.   Apply the CHG Soap to your body ONLY FROM THE NECK DOWN.  Do not use on open wounds or open sores. Avoid contact with your eyes, ears, mouth and genitals (private parts). Wash Face and genitals (private parts)  with your normal soap.   Wash thoroughly, paying special attention to the area where your surgery will  be performed.  Thoroughly rinse your body with warm water from the neck down.  DO NOT shower/wash with your normal soap after using and rinsing off the CHG Soap.  Pat yourself dry with a CLEAN TOWEL.  Wear CLEAN PAJAMAS to bed the night before surgery  Place CLEAN SHEETS on your bed the night before your surgery  DO NOT SLEEP WITH PETS.   Day of Surgery:  Take a shower with CHG soap. Wear Clean/Comfortable clothing the morning of surgery Do not apply any deodorants/lotions.   Remember to brush your teeth WITH YOUR REGULAR TOOTHPASTE.   Please read over the following fact sheets that you were given.

## 2021-07-31 ENCOUNTER — Encounter (HOSPITAL_COMMUNITY)
Admission: RE | Admit: 2021-07-31 | Discharge: 2021-07-31 | Disposition: A | Discharge status: Home / Self Care | Payer: Medicare HMO | Source: Ambulatory Visit | Attending: Neurosurgery | Admitting: Neurosurgery

## 2021-07-31 ENCOUNTER — Encounter (HOSPITAL_COMMUNITY): Payer: Self-pay

## 2021-07-31 ENCOUNTER — Other Ambulatory Visit: Payer: Self-pay

## 2021-07-31 VITALS — BP 124/79 | HR 74 | Temp 98.2°F | Resp 17 | Ht 67.0 in | Wt 178.6 lb

## 2021-07-31 DIAGNOSIS — Z01812 Encounter for preprocedural laboratory examination: Secondary | ICD-10-CM | POA: Insufficient documentation

## 2021-07-31 DIAGNOSIS — G4733 Obstructive sleep apnea (adult) (pediatric): Secondary | ICD-10-CM | POA: Insufficient documentation

## 2021-07-31 DIAGNOSIS — I119 Hypertensive heart disease without heart failure: Secondary | ICD-10-CM | POA: Diagnosis not present

## 2021-07-31 DIAGNOSIS — F17210 Nicotine dependence, cigarettes, uncomplicated: Secondary | ICD-10-CM | POA: Insufficient documentation

## 2021-07-31 DIAGNOSIS — I2510 Atherosclerotic heart disease of native coronary artery without angina pectoris: Secondary | ICD-10-CM | POA: Insufficient documentation

## 2021-07-31 DIAGNOSIS — I700 Atherosclerosis of aorta: Secondary | ICD-10-CM | POA: Diagnosis not present

## 2021-07-31 DIAGNOSIS — M4802 Spinal stenosis, cervical region: Secondary | ICD-10-CM | POA: Diagnosis not present

## 2021-07-31 DIAGNOSIS — Z01818 Encounter for other preprocedural examination: Secondary | ICD-10-CM

## 2021-07-31 DIAGNOSIS — J439 Emphysema, unspecified: Secondary | ICD-10-CM | POA: Diagnosis not present

## 2021-07-31 DIAGNOSIS — R0609 Other forms of dyspnea: Secondary | ICD-10-CM | POA: Diagnosis not present

## 2021-07-31 HISTORY — DX: Dyspnea, unspecified: R06.00

## 2021-07-31 LAB — BASIC METABOLIC PANEL
Anion gap: 6 (ref 5–15)
BUN: 10 mg/dL (ref 8–23)
CO2: 27 mmol/L (ref 22–32)
Calcium: 9.1 mg/dL (ref 8.9–10.3)
Chloride: 105 mmol/L (ref 98–111)
Creatinine, Ser: 1.14 mg/dL (ref 0.61–1.24)
GFR, Estimated: >60 mL/min (ref >60)
Glucose, Bld: 98 mg/dL (ref 70–99)
Potassium: 3.8 mmol/L (ref 3.5–5.1)
Sodium: 138 mmol/L (ref 135–145)

## 2021-07-31 LAB — SURGICAL PCR SCREEN
MRSA, PCR: NEGATIVE
Staphylococcus aureus: POSITIVE — AB

## 2021-07-31 LAB — TYPE AND SCREEN
ABO/RH(D): A POS
Antibody Screen: NEGATIVE

## 2021-07-31 LAB — GLUCOSE, CAPILLARY: Glucose-Capillary: 113 mg/dL — ABNORMAL HIGH (ref 70–99)

## 2021-07-31 NOTE — Progress Notes (Addendum)
PCP - Sueanne Margarita Cardiologist - Dr. Rex Kras Received cardiac clearance on 07/03/21  Pulmonologist - Praveen Mannam Patient stated he has SOB, and has had it for years, however, he feels like it has gotten worse over the past year.  Pulmonologist seen last week and ran tests.  Currently waiting on results.  Ebony Hail notified and to follow up.  Gastroenterologist - Silvano Rusk  Chest x-ray - 05/01/21 EKG - 06/10/21 ECHO - 10/10/20  SA - yes, getting CPAP on 08/01/21  Aspirin Instructions: follow your surgeon's instructions   COVID TEST- scheduled for 08/04/21 (surgery admit)   Anesthesia review: yes See above notes. Ebony Hail notified.  Patient denies fever, cough and chest pain at PAT appointment   All instructions explained to the patient, with a verbal understanding of the material. Patient agrees to go over the instructions while at home for a better understanding. Patient also instructed to self quarantine after being tested for COVID-19. The opportunity to ask questions was provided.

## 2021-08-01 NOTE — Progress Notes (Signed)
Anesthesia Chart Review:  Case: [id number] Date/Time: 08/07/21 1215   Procedure: ACDF C45, C56, C67 - 3C   Anesthesia type: General   Pre-op diagnosis: CERVICAL STENOSIS OF SPINAL CANAL   Location: [facility] OR ROOM 65 / [place] OR   Surgeons: Consuella Lose, MD       DISCUSSION: Patient is a 65 year old male scheduled for the above procedure.  History includes smoking, asthma, COPD, coronary artery calcifications (minimal CAD 10/23/20 CCTA), aortic atherosclerosis, HTN, dyspnea (chronic DOE), OSA (mild OSA 02/05/21 HST, referred for oral appliance, but reportedly picking up CPAP on 08/01/21).     He reported chronic DOE, somewhat worse since last year but no acute changes and has had recent cardiology and pulmonology evaluations. Preoperative input outlined below. He does not requires supplement oxygen.  Last cardiology evaluation with Dr. Terri Skains 06/10/2021.  Patient without anginal symptoms.  Continue to have "relatively stable" DOE and was now seen pulmonologist.  No additional cardiac testing warranted at that time.  Follow-up in 1 year.  Of note he did contact patient on 07/03/2021 for preoperative cardiology clearance and wrote,  "Patient presents acceptable cardiac risk for the planned noncardiac surgery.  Using the preoperative risk assessment guidelines as published by the [employer], the patient presents acceptable/low cardiac risk for the planned noncardiac surgery because there is no history of recent unstable angina pectoris or myocardial infarction, no signs or symptoms of decompensated CHF, no unaddressed complex dysrhythmia, no significant aortic valvular stenosis, and recently has undergone ischemic work-up including a coronary CTA.   This preoperative risk assessment is a tool to assist the surgeon in estimating the cardiac risk for the proposed upcoming noncardiac surgery.  The shared decision to proceed with surgery will be ultimately at the discretion of the patient after  the surgical risks, benefits, and alternatives have been discussed amongst the patient and his surgical team."  Pulmonology preoperative recommendations outlined on 04/30/21 by Rexene Edison, NP, " Preop pulmonary/respiratory exam Pulmonary preop risk assessment-patient has minimum COPD and a heavy smoker.  Pulmonary function testing shows normal lung function.  He does have mild obstructive sleep apnea We discussed that he has a mild to moderate pulmonary surgical risk. Have recommended smoking cessation.  If unable to quit.  At least no smoking for 1 week prior to surgery. May use his albuterol as needed.  Early mobility and mucociliary clearance are key. Patient is being referred for oral appliance for sleep apnea.  In postop setting with underlying mild obstructive sleep apnea could use CPAP if indicated... Recommend 1. Short duration of surgery as much as possible and avoid paralytic if possible 2. Recovery in step down or ICU with Pulmonary consultation if indicated  3. DVT prophylaxis if indicated  4. Aggressive pulmonary toilet with o2, bronchodilatation, and incentive spirometry and early ambulation, CPAP if needed".   Preoperative COVID-19 testing is scheduled for 08/04/2021.  Anesthesia team to evaluate on the day of surgery.   VS: BP 124/79   Pulse 74   Temp 36.8 C (Oral)   Resp 17   Ht 5\' 7"  (1.702 m)   Wt 81 kg   SpO2 99%   BMI 27.97 kg/m    PROVIDERS: Sueanne Margarita, DO is PCP at [facility]) Terri Skains, Elayne Snare, DO is cardiologist. Established since 10/01/20. First seen for chest pain and dyspnea on exertion. CCTA and echo ordered. Referral to pulmonology also recommended given ongoing smoking with 69-year pack history.  CCTA showed minimal atherosclerotic burden  with mild coronary artery calcification.  Echo showed LVH with preserved LVEF without any significant valvular heart disease.  Dyspnea felt related to underlying lung disease. - Mannam,  Praveen, MD is pulmonologist (for abnormal chest CT). First evaluated on 07/24/21 after low dose lung cancer screening chest CT showed findings likely reflecting sequelae of inflammation, but ILD could not be excluded. He has an upcoming high resolution chest CT scheduled for 08/13/21. 07/24/21 labs per pulmonology showed rheumatoid factor < 14, cyclic citrullin peptide < 16,  ANA negative, serum IgE 263 (H), and preliminary alpha-1 antitrypsin 169 (range 83-199). Dr. Vaughan Browner noted plans for upcoming surgery. - Baird Lyons, MD is pulmonologist (OSA) - Silvano Rusk, MD is GI   LABS: Labs reviewed: Acceptable for surgery. CBC on 07/30/21 showed WBC 5.6, HGB 15.3, HCT 44.3, PLT 220. AST/ALT normal 12/18/20 at [facility] (all labs ordered are listed, but only abnormal results are displayed)  Labs Reviewed  SURGICAL PCR SCREEN - Abnormal; Notable for the following components:      Result Value   Staphylococcus aureus POSITIVE (*)    All other components within normal limits  GLUCOSE, CAPILLARY - Abnormal; Notable for the following components:   Glucose-Capillary 113 (*)    All other components within normal limits  BASIC METABOLIC PANEL  TYPE AND SCREEN    OTHER: Colonoscopy 07/08/2021: IMPRESSION: Two diminutive polyps in the sigmoid colon, removed with a cold snare. Resected and retrieved. [Pathology: tubular adenoma without high grade dysplasia] - Severe diverticulosis in the sigmoid colon. There was narrowing of the colon in association with the diverticular opening. - The examination was otherwise normal on direct and retroflexion views. - Repeat colonoscopy in 7 years (2029) recommend.  PFTs 04/30/2021: Post-Bronchodilator: FVC 3.17 [89%], FEV1 2.42 [88%], F/F 76. Obstructive airway disease, no bronchodilator response  Home Sleep Study 01/26/21: IMPRESSION: Mild obstructive sleep apnea/hypopnea syndrome, AHI 9.3/h.  Snoring with oxygen desaturation to a nadir of 86% with average 94%.  Suggest  CPAP titration sleep study or AutoPap.  Other options would be based on clinical judgment. - per 06/26/21 note by Dr. Annamaria Boots, "Based on HST in May Order- new DME, new CPAP auto 5-15, mask of choice, humidifier, supplies, AirView/ card - per 04/30/21 note by Rexene Edison, NP, " We discussed his sleep study results.  We went over treatment options including weight loss, oral appliance, CPAP. He would like referral for oral appliance .Marland KitchenMarland KitchenRefer to Dr. Ron Parker for oral appliance for sleep apnea."   IMAGES: CT Chest lung cancer screen 06/19/2021: IMPRESSION: 1. Lung-RADS 2, benign appearance or behavior. Continue annual screening with low-dose chest CT without contrast in 12 months. 2. The S modifier above refers to a potentially clinically significant pulmonary finding. Specifically, there are multifocal patchy areas of ground-glass attenuation and interstitial reticulation noted bilaterally. Findings likely reflect sequelae of inflammation. Interstitial lung disease is not excluded. Consider further evaluation with high-resolution CT of the chest. 3. Aortic Atherosclerosis (ICD10-I70.0) and Emphysema (ICD10-J43.9). - High Resolution Chest CT scheduled for 08/13/21.  CXR 04/30/2021: FINDINGS: The heart size and mediastinal contours are within normal limits. Both lungs are clear. The visualized skeletal structures are unremarkable. IMPRESSION: No active cardiopulmonary disease.  MRI C-spine 05/08/21 ([facility]): IMPRESSION:  1. Progressed facet degeneration on the right at C2-C3 since the  2019 MRI, with marrow edema now compatible with acute exacerbation  of chronic facet joint arthritis.  2. Chronically advanced and largely stable cervical spine  degeneration elsewhere, including mild spinal stenosis and spinal  cord mass effect at C4-C5 and C6-C7. Probable chronic myelomalacia  of the left hemicord at the latter.  3. Moderate or severe degenerative neural foraminal stenosis at the  right  C4, and bilateral C5 through C8 nerve levels.     EKG: 06/10/2021: Normal sinus rhythm, 82 bpm, LAE, ST-T changes suggestive of early repolarization, without underlying injury pattern   CV: CCTA:  10/23/2020:  Total calcium score: 19 AU, which places the patient in the 63th percentile for age and sex matched control. Normal coronary origin with right dominance. CAD-RADS = 1. Minimal stenosis in distal LAD, Minimal stenosis in ostial LCX. Aortic atherosclerosis.  Echocardiogram: 10/10/2020: Normal LV systolic function with visual EF 55-60%. Left ventricle cavity is normal in size. Moderate left ventricular hypertrophy. Normal global wall motion. No significant valvular heart disease. No prior study for comparison  Past Medical History:  Diagnosis Date   Anxiety    Asthma    as a child   Atherosclerosis of aorta ([place])    COPD, mild ([place]) 06/07/2014   Coronary artery calcification    Depression    Dyspnea    GERD (gastroesophageal reflux disease)    Hemorrhoids    Hx of adenomatous colonic polyps 07/08/2021   2 diminutive - repeat exam 2029   Hypertension    Sleep apnea    in process of obtaining cpap    Past Surgical History:  Procedure Laterality Date   COLONOSCOPY  2011   SHOULDER ARTHROSCOPY W/ ROTATOR CUFF REPAIR Bilateral 02/08/2018    MEDICATIONS:  albuterol (VENTOLIN HFA) 108 (90 Base) MCG/ACT inhaler   aspirin EC 81 MG tablet   atorvastatin (LIPITOR) 20 MG tablet   meloxicam (MOBIC) 15 MG tablet   metoprolol succinate (TOPROL-XL) 100 MG 24 hr tablet   No current facility-administered medications for this encounter.  Advised to follow surgeon i perioperative aspirin instructions.  Myra Gianotti, PA-C Surgical Short Stay/Anesthesiology [facility] Phone [phone] [facility] Phone [phone] 08/01/2021 5:22 PM

## 2021-08-01 NOTE — Anesthesia Preprocedure Evaluation (Addendum)
Anesthesia Evaluation  Patient identified by MRN, date of birth, ID band Patient awake    Reviewed: Allergy & Precautions, NPO status , Patient's Chart, lab work & pertinent test results  Airway Mallampati: II  TM Distance: >3 FB Neck ROM: Full    Dental  (+) Teeth Intact, Dental Advisory Given   Pulmonary asthma , sleep apnea , COPD, Current Smoker and Patient abstained from smoking.,    breath sounds clear to auscultation       Cardiovascular hypertension, + CAD   Rhythm:Regular Rate:Normal  CCTA:  10/23/2020:  Total calcium score: 19 AU, which places the patient in the 63th percentile for age and sex matched control. Normal coronary origin with right dominance. CAD-RADS = 1. Minimal stenosis in distal LAD, Minimal stenosis in ostial LCX. Aortic atherosclerosis.  Echocardiogram: 10/10/2020: Normal LV systolic function with visual EF 55-60%. Left ventricle cavity is normal in size. Moderate left ventricular hypertrophy. Normal global wall motion. No significant valvular heart disease. No prior study for comparison   Neuro/Psych PSYCHIATRIC DISORDERS Anxiety Depression    GI/Hepatic Neg liver ROS, GERD  ,  Endo/Other  negative endocrine ROS  Renal/GU negative Renal ROS     Musculoskeletal negative musculoskeletal ROS (+)   Abdominal Normal abdominal exam  (+)   Peds  Hematology negative hematology ROS (+)   Anesthesia Other Findings   Reproductive/Obstetrics                           Anesthesia Physical Anesthesia Plan  ASA: 3  Anesthesia Plan: General   Post-op Pain Management:    Induction: Intravenous  PONV Risk Score and Plan: 2 and Ondansetron and Midazolam  Airway Management Planned: Oral ETT and Video Laryngoscope Planned  Additional Equipment: None  Intra-op Plan:   Post-operative Plan: Extubation in OR  Informed Consent: I have reviewed the patients History and  Physical, chart, labs and discussed the procedure including the risks, benefits and alternatives for the proposed anesthesia with the patient or authorized representative who has indicated his/her understanding and acceptance.     Dental advisory given  Plan Discussed with: CRNA  Anesthesia Plan Comments: (PAT note written 08/01/2021 by Myra Gianotti, PA-C. )      Anesthesia Quick Evaluation

## 2021-08-04 ENCOUNTER — Other Ambulatory Visit (HOSPITAL_COMMUNITY)
Admission: RE | Admit: 2021-08-04 | Discharge: 2021-08-04 | Disposition: A | Discharge status: Home / Self Care | Payer: Medicare HMO | Source: Ambulatory Visit | Attending: Neurosurgery | Admitting: Neurosurgery

## 2021-08-04 DIAGNOSIS — G4730 Sleep apnea, unspecified: Secondary | ICD-10-CM | POA: Diagnosis present

## 2021-08-04 DIAGNOSIS — G992 Myelopathy in diseases classified elsewhere: Secondary | ICD-10-CM | POA: Diagnosis present

## 2021-08-04 DIAGNOSIS — Z01818 Encounter for other preprocedural examination: Secondary | ICD-10-CM

## 2021-08-04 DIAGNOSIS — I10 Essential (primary) hypertension: Secondary | ICD-10-CM | POA: Diagnosis present

## 2021-08-04 DIAGNOSIS — M542 Cervicalgia: Secondary | ICD-10-CM | POA: Diagnosis present

## 2021-08-04 DIAGNOSIS — F17210 Nicotine dependence, cigarettes, uncomplicated: Secondary | ICD-10-CM | POA: Diagnosis present

## 2021-08-04 DIAGNOSIS — Z79899 Other long term (current) drug therapy: Secondary | ICD-10-CM | POA: Diagnosis not present

## 2021-08-04 DIAGNOSIS — Z20822 Contact with and (suspected) exposure to covid-19: Secondary | ICD-10-CM | POA: Diagnosis present

## 2021-08-04 DIAGNOSIS — M4322 Fusion of spine, cervical region: Secondary | ICD-10-CM | POA: Diagnosis not present

## 2021-08-04 DIAGNOSIS — Z7982 Long term (current) use of aspirin: Secondary | ICD-10-CM | POA: Diagnosis not present

## 2021-08-04 DIAGNOSIS — K219 Gastro-esophageal reflux disease without esophagitis: Secondary | ICD-10-CM | POA: Diagnosis present

## 2021-08-04 DIAGNOSIS — G959 Disease of spinal cord, unspecified: Secondary | ICD-10-CM | POA: Diagnosis not present

## 2021-08-04 DIAGNOSIS — Z791 Long term (current) use of non-steroidal anti-inflammatories (NSAID): Secondary | ICD-10-CM | POA: Diagnosis not present

## 2021-08-04 DIAGNOSIS — Z981 Arthrodesis status: Secondary | ICD-10-CM | POA: Diagnosis not present

## 2021-08-04 DIAGNOSIS — I2510 Atherosclerotic heart disease of native coronary artery without angina pectoris: Secondary | ICD-10-CM | POA: Diagnosis present

## 2021-08-04 DIAGNOSIS — J449 Chronic obstructive pulmonary disease, unspecified: Secondary | ICD-10-CM | POA: Diagnosis present

## 2021-08-04 DIAGNOSIS — M4802 Spinal stenosis, cervical region: Secondary | ICD-10-CM | POA: Diagnosis present

## 2021-08-04 DIAGNOSIS — I700 Atherosclerosis of aorta: Secondary | ICD-10-CM | POA: Diagnosis present

## 2021-08-04 DIAGNOSIS — Z01812 Encounter for preprocedural laboratory examination: Secondary | ICD-10-CM | POA: Insufficient documentation

## 2021-08-04 LAB — SARS CORONAVIRUS 2 (TAT 6-24 HRS): SARS Coronavirus 2: NEGATIVE

## 2021-08-05 LAB — ANA: Anti Nuclear Antibody (ANA): NEGATIVE

## 2021-08-05 LAB — ALPHA-1 ANTITRYPSIN PHENOTYPE: A-1 Antitrypsin, Ser: 169 mg/dL (ref 83–199)

## 2021-08-05 LAB — IGE: IgE (Immunoglobulin E), Serum: 263 kU/L — ABNORMAL HIGH (ref <=114)

## 2021-08-05 LAB — RHEUMATOID FACTOR: Rheumatoid fact SerPl-aCnc: <14 IU/mL (ref <14)

## 2021-08-05 LAB — CYCLIC CITRUL PEPTIDE ANTIBODY, IGG: Cyclic Citrullin Peptide Ab: <16 UNITS

## 2021-08-07 ENCOUNTER — Inpatient Hospital Stay (HOSPITAL_COMMUNITY)
Admission: RE | Admit: 2021-08-07 | Discharge: 2021-08-08 | DRG: 472 | Disposition: A | Discharge status: Home / Self Care | Payer: Medicare HMO | Attending: Neurosurgery | Admitting: Neurosurgery

## 2021-08-07 ENCOUNTER — Inpatient Hospital Stay (HOSPITAL_COMMUNITY): Payer: Medicare HMO | Admitting: Anesthesiology

## 2021-08-07 ENCOUNTER — Inpatient Hospital Stay (HOSPITAL_COMMUNITY): Payer: Medicare HMO

## 2021-08-07 ENCOUNTER — Encounter (HOSPITAL_COMMUNITY)
Admission: RE | Disposition: A | Discharge status: Home / Self Care | Payer: Self-pay | Source: Home / Self Care | Attending: Neurosurgery

## 2021-08-07 ENCOUNTER — Encounter (HOSPITAL_COMMUNITY): Payer: Self-pay | Admitting: Neurosurgery

## 2021-08-07 ENCOUNTER — Other Ambulatory Visit: Payer: Self-pay

## 2021-08-07 ENCOUNTER — Inpatient Hospital Stay (HOSPITAL_COMMUNITY): Payer: Medicare HMO | Admitting: Vascular Surgery

## 2021-08-07 DIAGNOSIS — F17210 Nicotine dependence, cigarettes, uncomplicated: Secondary | ICD-10-CM | POA: Diagnosis present

## 2021-08-07 DIAGNOSIS — Z20822 Contact with and (suspected) exposure to covid-19: Secondary | ICD-10-CM | POA: Diagnosis present

## 2021-08-07 DIAGNOSIS — M4802 Spinal stenosis, cervical region: Principal | ICD-10-CM | POA: Diagnosis present

## 2021-08-07 DIAGNOSIS — M542 Cervicalgia: Secondary | ICD-10-CM | POA: Diagnosis present

## 2021-08-07 DIAGNOSIS — I700 Atherosclerosis of aorta: Secondary | ICD-10-CM | POA: Diagnosis present

## 2021-08-07 DIAGNOSIS — G4730 Sleep apnea, unspecified: Secondary | ICD-10-CM | POA: Diagnosis present

## 2021-08-07 DIAGNOSIS — G992 Myelopathy in diseases classified elsewhere: Principal | ICD-10-CM | POA: Diagnosis present

## 2021-08-07 DIAGNOSIS — K219 Gastro-esophageal reflux disease without esophagitis: Secondary | ICD-10-CM | POA: Diagnosis present

## 2021-08-07 DIAGNOSIS — Z419 Encounter for procedure for purposes other than remedying health state, unspecified: Secondary | ICD-10-CM

## 2021-08-07 DIAGNOSIS — Z79899 Other long term (current) drug therapy: Secondary | ICD-10-CM

## 2021-08-07 DIAGNOSIS — J449 Chronic obstructive pulmonary disease, unspecified: Secondary | ICD-10-CM | POA: Diagnosis present

## 2021-08-07 DIAGNOSIS — Z791 Long term (current) use of non-steroidal anti-inflammatories (NSAID): Secondary | ICD-10-CM

## 2021-08-07 DIAGNOSIS — Z7982 Long term (current) use of aspirin: Secondary | ICD-10-CM

## 2021-08-07 DIAGNOSIS — I2584 Coronary atherosclerosis due to calcified coronary lesion: Secondary | ICD-10-CM

## 2021-08-07 DIAGNOSIS — M4322 Fusion of spine, cervical region: Secondary | ICD-10-CM | POA: Diagnosis not present

## 2021-08-07 DIAGNOSIS — I2510 Atherosclerotic heart disease of native coronary artery without angina pectoris: Secondary | ICD-10-CM | POA: Diagnosis present

## 2021-08-07 DIAGNOSIS — I10 Essential (primary) hypertension: Secondary | ICD-10-CM | POA: Diagnosis present

## 2021-08-07 DIAGNOSIS — Z981 Arthrodesis status: Secondary | ICD-10-CM | POA: Diagnosis not present

## 2021-08-07 HISTORY — PX: ANTERIOR CERVICAL DECOMP/DISCECTOMY FUSION: SHX1161

## 2021-08-07 LAB — ABO/RH: ABO/RH(D): A POS

## 2021-08-07 SURGERY — ANTERIOR CERVICAL DECOMPRESSION/DISCECTOMY FUSION 3 LEVELS
Anesthesia: General

## 2021-08-07 MED ORDER — HYDROMORPHONE HCL 1 MG/ML IJ SOLN
0.2500 mg | INTRAMUSCULAR | Status: DC | PRN
  Administered 2021-08-07 (×2): 0.5 mg via INTRAVENOUS
  Administered 2021-08-07: 17:00:00 0.25 mg via INTRAVENOUS

## 2021-08-07 MED ORDER — METOPROLOL SUCCINATE ER 100 MG PO TB24
100.0000 mg | ORAL_TABLET | Freq: Every day | ORAL | Status: DC
  Administered 2021-08-08: 100 mg via ORAL
  Filled 2021-08-07: qty 1

## 2021-08-07 MED ORDER — METHOCARBAMOL 500 MG PO TABS
500.0000 mg | ORAL_TABLET | Freq: Four times a day (QID) | ORAL | Status: DC | PRN
  Administered 2021-08-07 – 2021-08-08 (×2): 500 mg via ORAL
  Filled 2021-08-07 (×3): qty 1

## 2021-08-07 MED ORDER — SUGAMMADEX SODIUM 200 MG/2ML IV SOLN
INTRAVENOUS | Status: DC | PRN
  Administered 2021-08-07: 200 mg via INTRAVENOUS

## 2021-08-07 MED ORDER — DEXAMETHASONE SODIUM PHOSPHATE 10 MG/ML IJ SOLN
INTRAMUSCULAR | Status: DC | PRN
  Administered 2021-08-07: 10 mg via INTRAVENOUS

## 2021-08-07 MED ORDER — ACETAMINOPHEN 10 MG/ML IV SOLN: 1000.0000 mg | Freq: Once | INTRAVENOUS | Status: DC | PRN

## 2021-08-07 MED ORDER — THROMBIN 20000 UNITS EX SOLR
CUTANEOUS | Status: DC | PRN
  Administered 2021-08-07: 14:00:00 20 mL via TOPICAL

## 2021-08-07 MED ORDER — PHENOL 1.4 % MT LIQD: 1.0000 | OROMUCOSAL | Status: DC | PRN

## 2021-08-07 MED ORDER — FENTANYL CITRATE (PF) 250 MCG/5ML IJ SOLN
INTRAMUSCULAR | Status: AC
  Filled 2021-08-07: qty 5

## 2021-08-07 MED ORDER — CHLORHEXIDINE GLUCONATE CLOTH 2 % EX PADS: 6.0000 | MEDICATED_PAD | Freq: Once | CUTANEOUS | Status: DC

## 2021-08-07 MED ORDER — AMISULPRIDE (ANTIEMETIC) 5 MG/2ML IV SOLN: 10.0000 mg | Freq: Once | INTRAVENOUS | Status: DC | PRN

## 2021-08-07 MED ORDER — CEFAZOLIN SODIUM-DEXTROSE 2-4 GM/100ML-% IV SOLN
2.0000 g | Freq: Three times a day (TID) | INTRAVENOUS | Status: AC
  Administered 2021-08-07 – 2021-08-08 (×2): 2 g via INTRAVENOUS
  Filled 2021-08-07 (×2): qty 100

## 2021-08-07 MED ORDER — THROMBIN 5000 UNITS EX SOLR
OROMUCOSAL | Status: DC | PRN
  Administered 2021-08-07: 14:00:00 5 mL via TOPICAL

## 2021-08-07 MED ORDER — MEPERIDINE HCL 25 MG/ML IJ SOLN: 6.2500 mg | INTRAMUSCULAR | Status: DC | PRN

## 2021-08-07 MED ORDER — CEFAZOLIN SODIUM-DEXTROSE 2-4 GM/100ML-% IV SOLN
2.0000 g | INTRAVENOUS | Status: AC
  Administered 2021-08-07: 13:00:00 2 g via INTRAVENOUS

## 2021-08-07 MED ORDER — 0.9 % SODIUM CHLORIDE (POUR BTL) OPTIME
TOPICAL | Status: DC | PRN
  Administered 2021-08-07: 14:00:00 1000 mL

## 2021-08-07 MED ORDER — PROPOFOL 10 MG/ML IV BOLUS
INTRAVENOUS | Status: DC | PRN
  Administered 2021-08-07: 150 mg via INTRAVENOUS

## 2021-08-07 MED ORDER — ACETAMINOPHEN 650 MG RE SUPP: 650.0000 mg | RECTAL | Status: DC | PRN

## 2021-08-07 MED ORDER — MIDAZOLAM HCL 2 MG/2ML IJ SOLN
INTRAMUSCULAR | Status: DC | PRN
Start: 2021-08-07 — End: 2021-08-07
  Administered 2021-08-07: 2 mg via INTRAVENOUS

## 2021-08-07 MED ORDER — LACTATED RINGERS IV SOLN: INTRAVENOUS | Status: DC

## 2021-08-07 MED ORDER — EPHEDRINE SULFATE-NACL 50-0.9 MG/10ML-% IV SOSY
PREFILLED_SYRINGE | INTRAVENOUS | Status: DC | PRN
Start: 2021-08-07 — End: 2021-08-07
  Administered 2021-08-07 (×2): 5 mg via INTRAVENOUS

## 2021-08-07 MED ORDER — LIDOCAINE 2% (20 MG/ML) 5 ML SYRINGE
INTRAMUSCULAR | Status: DC | PRN
  Administered 2021-08-07: 40 mg via INTRAVENOUS

## 2021-08-07 MED ORDER — ONDANSETRON HCL 4 MG/2ML IJ SOLN
INTRAMUSCULAR | Status: DC | PRN
  Administered 2021-08-07: 4 mg via INTRAVENOUS

## 2021-08-07 MED ORDER — HYDROMORPHONE HCL 1 MG/ML IJ SOLN
INTRAMUSCULAR | Status: AC
  Filled 2021-08-07: qty 1

## 2021-08-07 MED ORDER — PROMETHAZINE HCL 25 MG/ML IJ SOLN: 6.2500 mg | INTRAMUSCULAR | Status: DC | PRN

## 2021-08-07 MED ORDER — MORPHINE SULFATE (PF) 2 MG/ML IV SOLN: 2.0000 mg | INTRAVENOUS | Status: DC | PRN

## 2021-08-07 MED ORDER — METHOCARBAMOL 1000 MG/10ML IJ SOLN
500.0000 mg | Freq: Four times a day (QID) | INTRAVENOUS | Status: DC | PRN
  Filled 2021-08-07: qty 5

## 2021-08-07 MED ORDER — MENTHOL 3 MG MT LOZG
1.0000 | LOZENGE | OROMUCOSAL | Status: DC | PRN
  Administered 2021-08-08: 3 mg via ORAL
  Filled 2021-08-07: qty 9

## 2021-08-07 MED ORDER — SENNA 8.6 MG PO TABS
1.0000 | ORAL_TABLET | Freq: Two times a day (BID) | ORAL | Status: DC
  Administered 2021-08-07 – 2021-08-08 (×2): 8.6 mg via ORAL
  Filled 2021-08-07 (×2): qty 1

## 2021-08-07 MED ORDER — SODIUM CHLORIDE 0.9% FLUSH
3.0000 mL | Freq: Two times a day (BID) | INTRAVENOUS | Status: DC
  Administered 2021-08-07: 21:00:00 3 mL via INTRAVENOUS

## 2021-08-07 MED ORDER — THROMBIN 20000 UNITS EX SOLR
CUTANEOUS | Status: AC
  Filled 2021-08-07: qty 20000

## 2021-08-07 MED ORDER — ALBUTEROL SULFATE (2.5 MG/3ML) 0.083% IN NEBU: 2.5000 mg | INHALATION_SOLUTION | Freq: Four times a day (QID) | RESPIRATORY_TRACT | Status: DC | PRN

## 2021-08-07 MED ORDER — BISACODYL 10 MG RE SUPP: 10.0000 mg | Freq: Every day | RECTAL | Status: DC | PRN

## 2021-08-07 MED ORDER — CHLORHEXIDINE GLUCONATE 0.12 % MT SOLN
OROMUCOSAL | Status: AC
  Administered 2021-08-07: 11:00:00 15 mL via OROMUCOSAL
  Filled 2021-08-07: qty 15

## 2021-08-07 MED ORDER — MIDAZOLAM HCL 2 MG/2ML IJ SOLN
INTRAMUSCULAR | Status: AC
  Filled 2021-08-07: qty 2

## 2021-08-07 MED ORDER — ATORVASTATIN CALCIUM 10 MG PO TABS
20.0000 mg | ORAL_TABLET | Freq: Every day | ORAL | Status: DC
  Administered 2021-08-07 – 2021-08-08 (×2): 20 mg via ORAL
  Filled 2021-08-07 (×2): qty 2

## 2021-08-07 MED ORDER — POLYETHYLENE GLYCOL 3350 17 G PO PACK: 17.0000 g | PACK | Freq: Every day | ORAL | Status: DC | PRN

## 2021-08-07 MED ORDER — FENTANYL CITRATE (PF) 250 MCG/5ML IJ SOLN
INTRAMUSCULAR | Status: DC | PRN
  Administered 2021-08-07: 25 ug via INTRAVENOUS
  Administered 2021-08-07: 100 ug via INTRAVENOUS
  Administered 2021-08-07: 50 ug via INTRAVENOUS
  Administered 2021-08-07 (×3): 25 ug via INTRAVENOUS

## 2021-08-07 MED ORDER — ONDANSETRON HCL 4 MG/2ML IJ SOLN: 4.0000 mg | Freq: Four times a day (QID) | INTRAMUSCULAR | Status: DC | PRN

## 2021-08-07 MED ORDER — SODIUM CHLORIDE 0.9% FLUSH: 3.0000 mL | INTRAVENOUS | Status: DC | PRN

## 2021-08-07 MED ORDER — LIDOCAINE-EPINEPHRINE 1 %-1:100000 IJ SOLN
INTRAMUSCULAR | Status: AC
  Filled 2021-08-07: qty 1

## 2021-08-07 MED ORDER — OXYCODONE HCL 5 MG PO TABS
5.0000 mg | ORAL_TABLET | ORAL | Status: DC | PRN
  Administered 2021-08-07: 23:00:00 5 mg via ORAL
  Filled 2021-08-07: qty 1

## 2021-08-07 MED ORDER — CHLORHEXIDINE GLUCONATE 0.12 % MT SOLN: 15.0000 mL | Freq: Once | OROMUCOSAL | Status: AC

## 2021-08-07 MED ORDER — ACETAMINOPHEN 160 MG/5ML PO SOLN: 325.0000 mg | Freq: Once | ORAL | Status: DC | PRN

## 2021-08-07 MED ORDER — FLEET ENEMA 7-19 GM/118ML RE ENEM: 1.0000 | ENEMA | Freq: Once | RECTAL | Status: DC | PRN

## 2021-08-07 MED ORDER — ONDANSETRON HCL 4 MG PO TABS: 4.0000 mg | ORAL_TABLET | Freq: Four times a day (QID) | ORAL | Status: DC | PRN

## 2021-08-07 MED ORDER — BUPIVACAINE HCL 0.5 % IJ SOLN
INTRAMUSCULAR | Status: DC | PRN
  Administered 2021-08-07: 4 mL

## 2021-08-07 MED ORDER — SODIUM CHLORIDE 0.9 % IV SOLN: 250.0000 mL | INTRAVENOUS | Status: DC

## 2021-08-07 MED ORDER — BUPIVACAINE HCL (PF) 0.5 % IJ SOLN
INTRAMUSCULAR | Status: AC
  Filled 2021-08-07: qty 30

## 2021-08-07 MED ORDER — PANTOPRAZOLE SODIUM 40 MG IV SOLR
40.0000 mg | Freq: Every day | INTRAVENOUS | Status: DC
  Administered 2021-08-07: 21:00:00 40 mg via INTRAVENOUS
  Filled 2021-08-07: qty 40

## 2021-08-07 MED ORDER — PHENYLEPHRINE HCL-NACL 20-0.9 MG/250ML-% IV SOLN
INTRAVENOUS | Status: DC | PRN
  Administered 2021-08-07: 20 ug/min via INTRAVENOUS

## 2021-08-07 MED ORDER — ORAL CARE MOUTH RINSE: 15.0000 mL | Freq: Once | OROMUCOSAL | Status: AC

## 2021-08-07 MED ORDER — CEFAZOLIN SODIUM-DEXTROSE 2-4 GM/100ML-% IV SOLN
INTRAVENOUS | Status: AC
  Filled 2021-08-07: qty 100

## 2021-08-07 MED ORDER — OXYCODONE HCL 5 MG PO TABS: 10.0000 mg | ORAL_TABLET | ORAL | Status: DC | PRN

## 2021-08-07 MED ORDER — THROMBIN 5000 UNITS EX SOLR
CUTANEOUS | Status: AC
  Filled 2021-08-07: qty 5000

## 2021-08-07 MED ORDER — ROCURONIUM BROMIDE 10 MG/ML (PF) SYRINGE
PREFILLED_SYRINGE | INTRAVENOUS | Status: DC | PRN
  Administered 2021-08-07: 60 mg via INTRAVENOUS
  Administered 2021-08-07: 20 mg via INTRAVENOUS
  Administered 2021-08-07: 40 mg via INTRAVENOUS
  Administered 2021-08-07: 20 mg via INTRAVENOUS

## 2021-08-07 MED ORDER — SODIUM CHLORIDE 0.9 % IV SOLN: INTRAVENOUS | Status: DC

## 2021-08-07 MED ORDER — DOCUSATE SODIUM 100 MG PO CAPS
100.0000 mg | ORAL_CAPSULE | Freq: Two times a day (BID) | ORAL | Status: DC
  Administered 2021-08-07 – 2021-08-08 (×2): 100 mg via ORAL
  Filled 2021-08-07 (×2): qty 1

## 2021-08-07 MED ORDER — ACETAMINOPHEN 325 MG PO TABS
650.0000 mg | ORAL_TABLET | ORAL | Status: DC | PRN
  Administered 2021-08-08: 650 mg via ORAL
  Filled 2021-08-07: qty 2

## 2021-08-07 MED ORDER — ACETAMINOPHEN 325 MG PO TABS: 325.0000 mg | ORAL_TABLET | Freq: Once | ORAL | Status: DC | PRN

## 2021-08-07 MED ORDER — LIDOCAINE-EPINEPHRINE 1 %-1:100000 IJ SOLN
INTRAMUSCULAR | Status: DC | PRN
  Administered 2021-08-07: 4 mL

## 2021-08-07 SURGICAL SUPPLY — 60 items
ADH SKN CLS APL DERMABOND .7 (GAUZE/BANDAGES/DRESSINGS) ×1
APL SKNCLS STERI-STRIP NONHPOA (GAUZE/BANDAGES/DRESSINGS)
BAG COUNTER SPONGE SURGICOUNT (BAG) ×2 IMPLANT
BAG SPNG CNTER NS LX DISP (BAG) ×1
BAND INSRT 18 STRL LF DISP RB (MISCELLANEOUS) ×2
BAND RUBBER #18 3X1/16 STRL (MISCELLANEOUS) ×4 IMPLANT
BENZOIN TINCTURE PRP APPL 2/3 (GAUZE/BANDAGES/DRESSINGS) IMPLANT
BLADE CLIPPER SURG (BLADE) IMPLANT
BLADE SURG 11 STRL SS (BLADE) ×2 IMPLANT
BLADE ULTRA TIP 2M (BLADE) IMPLANT
BUR MATCHSTICK NEURO 3.0 LAGG (BURR) ×2 IMPLANT
CAGE ENDOSKELETON TC 14X12X5 (Cage) ×1 IMPLANT
CAGE LORDOTIC 6 SM (Cage) ×2 IMPLANT
CANISTER SUCT 3000ML PPV (MISCELLANEOUS) ×2 IMPLANT
CARTRIDGE OIL MAESTRO DRILL (MISCELLANEOUS) ×1 IMPLANT
DECANTER SPIKE VIAL GLASS SM (MISCELLANEOUS) ×2 IMPLANT
DERMABOND ADVANCED (GAUZE/BANDAGES/DRESSINGS) ×1
DERMABOND ADVANCED .7 DNX12 (GAUZE/BANDAGES/DRESSINGS) ×1 IMPLANT
DIFFUSER DRILL AIR PNEUMATIC (MISCELLANEOUS) ×2 IMPLANT
DRAIN CHANNEL 10M FLAT 3/4 FLT (DRAIN) IMPLANT
DRAPE C-ARM 42X72 X-RAY (DRAPES) ×4 IMPLANT
DRAPE HALF SHEET 40X57 (DRAPES) IMPLANT
DRAPE LAPAROTOMY 100X72 PEDS (DRAPES) ×2 IMPLANT
DRAPE MICROSCOPE LEICA (MISCELLANEOUS) ×2 IMPLANT
DRSG OPSITE POSTOP 3X4 (GAUZE/BANDAGES/DRESSINGS) ×4 IMPLANT
DRSG OPSITE POSTOP 4X6 (GAUZE/BANDAGES/DRESSINGS) ×2 IMPLANT
DURAPREP 6ML APPLICATOR 50/CS (WOUND CARE) ×2 IMPLANT
ELECT COATED BLADE 2.86 ST (ELECTRODE) ×2 IMPLANT
EVACUATOR SILICONE 100CC (DRAIN) IMPLANT
GAUZE 4X4 16PLY ~~LOC~~+RFID DBL (SPONGE) IMPLANT
GLOVE EXAM NITRILE XL STR (GLOVE) IMPLANT
GLOVE SURG ENC MOIS LTX SZ7.5 (GLOVE) IMPLANT
GLOVE SURG LTX SZ7 (GLOVE) ×2 IMPLANT
GLOVE SURG UNDER POLY LF SZ7.5 (GLOVE) ×4 IMPLANT
GOWN STRL REUS W/ TWL LRG LVL3 (GOWN DISPOSABLE) ×2 IMPLANT
GOWN STRL REUS W/ TWL XL LVL3 (GOWN DISPOSABLE) IMPLANT
GOWN STRL REUS W/TWL 2XL LVL3 (GOWN DISPOSABLE) IMPLANT
GOWN STRL REUS W/TWL LRG LVL3 (GOWN DISPOSABLE) ×4
GOWN STRL REUS W/TWL XL LVL3 (GOWN DISPOSABLE)
HEMOSTAT POWDER KIT SURGIFOAM (HEMOSTASIS) ×2 IMPLANT
KIT BASIN OR (CUSTOM PROCEDURE TRAY) ×2 IMPLANT
KIT TURNOVER KIT B (KITS) ×2 IMPLANT
NEEDLE HYPO 22GX1.5 SAFETY (NEEDLE) ×2 IMPLANT
NEEDLE SPNL 22GX3.5 QUINCKE BK (NEEDLE) ×2 IMPLANT
NS IRRIG 1000ML POUR BTL (IV SOLUTION) ×2 IMPLANT
OIL CARTRIDGE MAESTRO DRILL (MISCELLANEOUS) ×2
PACK LAMINECTOMY NEURO (CUSTOM PROCEDURE TRAY) ×2 IMPLANT
PAD ARMBOARD 7.5X6 YLW CONV (MISCELLANEOUS) ×6 IMPLANT
PLATE ZEVO 3LVL 51MM (Plate) ×1 IMPLANT
PUTTY DBM GRAFTON 5CC (Putty) ×2 IMPLANT
SCREW 3.5 SELFDRILL 15MM VARI (Screw) ×8 IMPLANT
SPONGE INTESTINAL PEANUT (DISPOSABLE) ×2 IMPLANT
SPONGE SURGIFOAM ABS GEL 100 (HEMOSTASIS) ×2 IMPLANT
STRIP CLOSURE SKIN 1/2X4 (GAUZE/BANDAGES/DRESSINGS) IMPLANT
SUT VIC AB 3-0 SH 8-18 (SUTURE) ×2 IMPLANT
SUT VICRYL 3-0 RB1 18 ABS (SUTURE) ×2 IMPLANT
TAPE CLOTH 3X10 TAN LF (GAUZE/BANDAGES/DRESSINGS) ×2 IMPLANT
TOWEL GREEN STERILE (TOWEL DISPOSABLE) ×2 IMPLANT
TOWEL GREEN STERILE FF (TOWEL DISPOSABLE) ×2 IMPLANT
WATER STERILE IRR 1000ML POUR (IV SOLUTION) ×2 IMPLANT

## 2021-08-07 NOTE — H&P (Signed)
Chief Complaint  Neck and arm pain  History of Present Illness  Joshua Stephenson is a 65 y.o. male initially seen in the outpatient neurosurgery clinic, followed for relatively chronic neck and arm pain.  Patient initially complained of right greater than left pain and paresthesias with progression to involve essentially both sides equally.  Patient also does note subjective weakness primarily in grip strength bilaterally.  Past Medical History   Past Medical History:  Diagnosis Date   Anxiety    Asthma    as a child   Atherosclerosis of aorta ([place])    COPD, mild ([place]) 06/07/2014   Coronary artery calcification    Depression    Dyspnea    GERD (gastroesophageal reflux disease)    Hemorrhoids    Hx of adenomatous colonic polyps 07/08/2021   2 diminutive - repeat exam 2029   Hypertension    Sleep apnea    in process of obtaining cpap    Past Surgical History   Past Surgical History:  Procedure Laterality Date   COLONOSCOPY  2011   SHOULDER ARTHROSCOPY W/ ROTATOR CUFF REPAIR Bilateral 02/08/2018    Social History   Social History   Tobacco Use   Smoking status: Every Day    Packs/day: 0.25    Years: 20.00    Pack years: 5.00    Types: Cigarettes    Last attempt to quit: 12/2019    Years since quitting: 1.6   Smokeless tobacco: Never   Tobacco comments:    1/2 pack 07/24/21  Vaping Use   Vaping Use: Never used  Substance Use Topics   Alcohol use: Not Currently   Drug use: No    Medications   Prior to Admission medications   Medication Sig Start Date End Date Taking? Authorizing Provider  albuterol (VENTOLIN HFA) 108 (90 Base) MCG/ACT inhaler Inhale 1-2 puffs into the lungs every 6 (six) hours as needed for wheezing or shortness of breath. 05/22/21  Yes Parrett, Tammy S, NP  aspirin EC 81 MG tablet Take 1 tablet (81 mg total) by mouth daily. Swallow whole. 10/28/20  Yes Tolia, Sunit, DO  atorvastatin (LIPITOR) 20 MG tablet TAKE 1 TABLET BY MOUTH EVERYDAY  AT BEDTIME 04/21/21  Yes Tolia, Sunit, DO  metoprolol succinate (TOPROL-XL) 100 MG 24 hr tablet Take 1 tablet (100 mg total) by mouth daily. Take with or immediately following a meal. 10/28/20 08/07/21 Yes Tolia, Sunit, DO  meloxicam (MOBIC) 15 MG tablet Take 15 mg by mouth daily.    [provider]  dicyclomine (BENTYL) 20 MG tablet Take 1 tablet (20 mg total) by mouth 4 (four) times daily -  before meals and at bedtime. As needed for cramps and diarrhea 04/04/18 02/25/20  Gatha Mayer, MD  escitalopram (LEXAPRO) 10 MG tablet Take 1 tablet (10 mg total) by mouth daily. 04/26/18 02/25/20  Nafziger, Tommi Rumps, NP  triazolam (HALCION) 0.25 MG tablet Take 1 tab by mouth 1 hour prior to procedure with very light food, do not drive motor vehicle. 06/23/18 02/25/20  Magnus Sinning, MD    Allergies  No Known Allergies  Review of Systems  ROS  Neurologic Exam  Awake, alert, oriented Memory and concentration grossly intact Speech fluent, appropriate CN grossly intact Motor exam: Upper Extremities Deltoid Bicep Tricep Grip  Right 5/5 5/5 5/5 5/5  Left 5/5 5/5 5/5 5/5   Lower Extremities IP Quad PF DF EHL  Right 5/5 5/5 5/5 5/5 5/5  Left 5/5 5/5 5/5 5/5 5/5  Sensation grossly intact to LT  Imaging  MRI of the cervical spine dated 05/08/2020 was personally reviewed.  This demonstrates primary findings at C4-5 C5-6 and C6-7.  At C4-5 there is broad-based disc osteophyte complex with effacement of the ventral CSF space and chronic myelomalacia involving the ventral aspect of the left hemicord.  There is also severe right and moderately severe left-sided foraminal stenosis.  At C5-6 there is broad-based disc osteophyte with mild central and moderate biforaminal stenosis.  Similar finding at C6-7 with severe left and moderate right foraminal stenosis.  Impression  - 65 y.o. male with chronic neck and bilateral arm pain likely related to multilevel cervical central and foraminal stenosis  Plan  -We  will plan on proceeding with ACDF at C4-5 C5-6 and C6-7  I have reviewed the treatment options at length with the patient in the office.  We have discussed the details of surgery as well as the expected postoperative course and recovery.  We have discussed the associated risks, benefits, and alternatives to surgery.  All his questions today were answered and he provided informed consent to proceed.   Consuella Lose, MD [facility]

## 2021-08-07 NOTE — Transfer of Care (Signed)
Immediate Anesthesia Transfer of Care Note  Patient: Joshua Stephenson  Procedure(s) Performed: Anterior Cervical Decompression/discectomy Fusion Cervical four five, Cervical five six, Cervical six seven  Patient Location: PACU  Anesthesia Type:General  Level of Consciousness: drowsy and patient cooperative  Airway & Oxygen Therapy: Patient Spontanous Breathing and Patient connected to face mask oxygen  Post-op Assessment: Report given to RN and Post -op Vital signs reviewed and stable  Post vital signs: Reviewed and stable  Last Vitals:  Vitals Value Taken Time  BP 143/96 08/07/21 1641  Temp    Pulse 77 08/07/21 1643  Resp 19 08/07/21 1643  SpO2 95 % 08/07/21 1643  Vitals shown include unvalidated device data.  Last Pain:  Vitals:   08/07/21 1035  TempSrc:   PainSc: 0-No pain         Complications: No notable events documented.

## 2021-08-07 NOTE — Op Note (Signed)
NEUROSURGERY OPERATIVE NOTE   PREOP DIAGNOSIS: Cervical Stenosis with Myelopathy   POSTOP DIAGNOSIS: Same  PROCEDURE: 1. Discectomy at C4-5, C5-6, C6-7 for decompression of spinal cord and exiting nerve roots  2. Placement of intervertebral biomechanical device, Medtronic Titan 58mm small, lordotic @ C5-6, C6-7, 77mm small, lordotic @ C4-5 3. Placement of anterior instrumentation consisting of interbody plate and screws spanning Medtronic Zevo 59mm plate, 75mm screws x8 4. Use of morselized bone allograft  5. Arthrodesis C4-5, C5-6, C6-7, anterior interbody technique  6. Use of intraoperative microscope  SURGEON: Dr. Consuella Lose, MD  ASSISTANT: Dr. Newman Pies, MD  ANESTHESIA: General Endotracheal  EBL: 100cc  SPECIMENS: None  DRAINS: None  COMPLICATIONS: None immediate  CONDITION: Hemodynamically stable to PACU  HISTORY: Joshua Stephenson is a 65 y.o. man initially seen in the outpatient neurosurgery clinic with relatively chronic neck and bilateral arm pain.  He was also complaining of subjective grip strength weakness.  His imaging did demonstrate significant cervical stenosis including centrally and foraminal stenosis worst at C4-5, C5-6, and C6-7.  Patient ultimately elected to proceed with surgical decompression and fusion.  The risks, benefits, and alternatives to surgery were all reviewed in detail with the patient.  After all his questions were answered informed consent was obtained and witnessed.  PROCEDURE IN DETAIL: The patient was brought to the operating room and transferred to the operative table. After induction of general anesthesia, the patient was positioned on the operative table in the supine position with all pressure points meticulously padded. The skin of the neck was then prepped and draped in the usual sterile fashion.  After timeout was conducted, the right-sided transverse skin incision was infiltrated with local anesthetic. Incision was  then made sharply and Bovie electrocautery was used to dissect the subcutaneous tissue until the platysma was identified. The platysma was then divided and undermined. The sternocleidomastoid muscle was then identified and, utilizing natural fascial planes in the neck, the prevertebral fascia was identified and the carotid sheath was retracted laterally and the trachea and esophagus retracted medially. Spinal needle was introduced into the C4-5 disc space, and our position was confirmed with intraoperative fluoroscopy.  Bovie electrocautery was used to dissect in the subperiosteal plane and elevate the bilateral longus coli muscles.  Table mounted retractors were then placed. At this point, the microscope was draped and brought into the field, and the remainder of the case was done under the microscope using microdissecting technique.  The C6-7 disc space was incised sharply and rongeurs were used to initially complete a discectomy.  Disc space was noted to be significantly collapsed, and did require the placement of disc space spreaders.  The high-speed drill was then used to complete discectomy until the posterior annulus was identified and removed and the posterior longitudinal ligament was identified. Using a nerve hook, the PLL was elevated, and Kerrison rongeurs were used to remove the posterior longitudinal ligament and the ventral thecal sac was identified. Using a combination of curettes and rongeurs, complete decompression of the thecal sac and exiting nerve roots at this level was completed.  This included removal of the uncovertebral joints bilaterally using a combination of high-speed drill and Kerrison punches.  This allowed visualization of the proximal portion of the exiting nerve roots.  Endplates were prepared with curettes.  Trials were used to select a 6 mm cage.  The cage was then inserted and tapped into place slightly countersunk to the anterior aspect of the vertebral body.  Attention  was then turned to the C5-6 level. In a similar fashion, discectomy was completed initially with curettes and rongeurs, and completed with the drill.  Again, the disc space was quite collapsed and this required placement of disc space spreaders.  The PLL was again identified, elevated and incised.  The thecal sac and nerve roots were then decompressed using Kerrison punches to remove the thickened posterior longitudinal ligament.  I also remove the uncovertebral joints bilaterally and the exiting C6 nerve roots were identified bilaterally.  Nerve hook was then freely passed in the ventral epidural space indicating good decompression.  Endplates were then prepared with curettes.  A 6 mm lordotic interbody cage was then sized and filled with bone allograft, and tapped into place.  The cage was then slightly countersunk to the anterior aspect of the vertebral bodies.  Attention was then turned to the C4-5 level. In a similar fashion, discectomy was completed initially with curettes and rongeurs, and completed with the drill.  This included use of the disc space spreader.  The PLL was again identified, elevated and incised.  In a similar fashion, the high-speed drill and Kerrison punches were used to remove the thickened posterior longitudinal ligament as well as posterior osteophytes emanating primarily from the C4 vertebral body.  I remove the uncovertebral joints bilaterally in order to identify the C5 nerve roots.  Endplates were then prepared with curettes.  A 5 mm lordotic interbody cage was then sized and filled with bone allograft, and tapped into place.  The cage was then countersunk to the anterior vertebral bodies.  After placement of the intervertebral devices, the anterior cervical plate was selected, and placed across the interspaces. Using a high-speed drill, the cortex of the cervical vertebral bodies was punctured, and screws inserted in the C4, C5, C6, and C7 levels. Final fluoroscopic images  in lateral projection was taken to confirm good hardware placement.  At this point, after all counts were verified to be correct, meticulous hemostasis was secured using a combination of bipolar electrocautery and passive hemostatics. The platysma muscle was then closed using interrupted 3-0 Vicryl sutures, and the skin was closed with a interrupted subcuticular stitch. Sterile dressings were then applied and the drapes removed.  The patient tolerated the procedure well and was extubated in the room and taken to the postanesthesia care unit in stable condition.   Consuella Lose, MD [facility]

## 2021-08-07 NOTE — Progress Notes (Signed)
   Pacu RN Report to floor given  Gave report to [facility]. [phone]. Discussed surgery, meds given in OR and Pacu, VS, IV fluids given, EBL, urine output, pain and other pertinent information. Also discussed if pt had any family or friends here or belongings with them.   Pt exits my care. Pacu RN Report to floor given. Pt brought up w/ [employer].   Gave report to [facility] RN. [phone]. Discussed surgery, meds given in OR and Pacu, VS, IV fluids given, EBL, urine output, pain and other pertinent information. Also discussed if pt had any family or friends here or belongings with them.   Pt exits my care.

## 2021-08-07 NOTE — Progress Notes (Signed)
Orthopedic Tech Progress Note Patient Details:  Joshua Stephenson [birth year] [id number] Aspen cervical collar delivered Patient ID: Joshua Stephenson, male   DOB: [birth year], 65 y.o.   MRN: [id number]  Joshua Stephenson 08/07/2021, 5:33 PM

## 2021-08-07 NOTE — Anesthesia Procedure Notes (Signed)
Procedure Name: Intubation Date/Time: 08/07/2021 12:55 PM Performed by: Thelma Comp, CRNA Pre-anesthesia Checklist: Patient identified, Emergency Drugs available, Suction available and Patient being monitored Patient Re-evaluated:Patient Re-evaluated prior to induction Oxygen Delivery Method: Circle System Utilized Preoxygenation: Pre-oxygenation with 100% oxygen Induction Type: IV induction Ventilation: Mask ventilation without difficulty Laryngoscope Size: 4 and Glidescope Grade View: Grade I Tube type: Oral Tube size: 7.5 mm Number of attempts: 1 Airway Equipment and Method: Stylet Placement Confirmation: ETT inserted through vocal cords under direct vision, positive ETCO2 and breath sounds checked- equal and bilateral Secured at: 23 cm Tube secured with: Tape Dental Injury: Teeth and Oropharynx as per pre-operative assessment  Comments: Elective glide

## 2021-08-08 MED ORDER — ASPIRIN EC 81 MG PO TBEC: 81.0000 mg | DELAYED_RELEASE_TABLET | Freq: Every day | ORAL | 3 refills | Status: DC

## 2021-08-08 MED ORDER — METHOCARBAMOL 500 MG PO TABS
500.0000 mg | ORAL_TABLET | Freq: Three times a day (TID) | ORAL | 0 refills | Status: DC | PRN
Start: 2021-08-08 — End: 2022-06-11

## 2021-08-08 MED ORDER — OXYCODONE HCL 10 MG PO TABS: 10.0000 mg | ORAL_TABLET | Freq: Four times a day (QID) | ORAL | 0 refills | Status: AC | PRN

## 2021-08-08 NOTE — Evaluation (Signed)
Occupational Therapy Evaluation Patient Details Name: Joshua Stephenson MRN: [id number] DOB: 12/31/1955 Today's Date: 08/08/2021   History of Present Illness Pt is 65 y.o. male s/p ACDF C4-5, C5-6, C6-7 on 11/17. PMH includes HTN, GERD, anxiety, depression, and bilateral shoulder arthroplasties (2019).   Clinical Impression   Pt independent at baseline with ADLs and functional mobility. Plans to stay at a friend's house at d/c, for assistance. Pt set up - min guard for ADLs, educated on compensatory strategies for UB/LB dressing, showering, grooming tasks, as well as cervical precautions, pt verbalized and demonstrated understanding. Pt mod I for bed mobility using log rolling technique, and supervision - min guard for all transfers. Pt presenting with mild impairments in activity tolerance, balance, ROM, and strength at this time, however has no acute OT needs, will s/o. Recommend safe d/c to friend's house with assistance.     Recommendations for follow up therapy are one component of a multi-disciplinary discharge planning process, led by the attending physician.  Recommendations may be updated based on patient status, additional functional criteria and insurance authorization.   Follow Up Recommendations  No OT follow up    Assistance Recommended at Discharge Set up Supervision/Assistance  Functional Status Assessment  Patient has had a recent decline in their functional status and demonstrates the ability to make significant improvements in function in a reasonable and predictable amount of time.  Equipment Recommendations  None recommended by OT    Recommendations for Other Services PT consult     Precautions / Restrictions Precautions Precautions: Cervical Precaution Booklet Issued: Yes (comment) Required Braces or Orthoses: Cervical Brace Restrictions Weight Bearing Restrictions: No      Mobility Bed Mobility Overal bed mobility: Modified Independent              General bed mobility comments: uses log rolling technique    Transfers Overall transfer level: Needs assistance Equipment used: None Transfers: Sit to/from Stand;Bed to chair/wheelchair/BSC Sit to Stand: Supervision;Min guard                  Balance Overall balance assessment: No apparent balance deficits (not formally assessed)                                         ADL either performed or assessed with clinical judgement   ADL Overall ADL's : Needs assistance/impaired Eating/Feeding: Set up;Sitting   Grooming: Oral care;Standing;Set up   Upper Body Bathing: Min guard;Sitting Upper Body Bathing Details (indicate cue type and reason): reviewed precuations for UB bathing, keeping arms <90 degrees Lower Body Bathing: Set up;Sitting/lateral leans Lower Body Bathing Details (indicate cue type and reason): pt able to demonstrate figure 4 technique, able to reach BLE's during bathing. Upper Body Dressing : Minimal assistance;Standing Upper Body Dressing Details (indicate cue type and reason): dons/doffs brace in standing in front of mirror, pt not wanting to get dressed at this time as he is not d/cing, educated pt on ways to don/doff shirt while adhering to precautions Lower Body Dressing: Set up;Sit to/from stand Lower Body Dressing Details (indicate cue type and reason): pt able to demosntrate figure 4 technique for LB dressing, able to reach down and pull up socks Toilet Transfer: Supervision/safety;Ambulation;Comfort height toilet Toilet Transfer Details (indicate cue type and reason): simulated to chair         Functional mobility during ADLs: Set up;Min guard  Vision   Vision Assessment?: No apparent visual deficits     Perception     Praxis      Pertinent Vitals/Pain Pain Assessment: Faces Pain Score: 1  Faces Pain Scale: Hurts a little bit Pain Location: neck Pain Descriptors / Indicators: Constant;Discomfort Pain  Intervention(s): Premedicated before session;Monitored during session;Limited activity within patient's tolerance;Repositioned     Hand Dominance     Extremity/Trunk Assessment Upper Extremity Assessment Upper Extremity Assessment: Overall WFL for tasks assessed   Lower Extremity Assessment Lower Extremity Assessment: Defer to PT evaluation   Cervical / Trunk Assessment Cervical / Trunk Assessment: Neck Surgery   Communication Communication Communication: No difficulties   Cognition Arousal/Alertness: Awake/alert Behavior During Therapy: Anxious Overall Cognitive Status: Within Functional Limits for tasks assessed                                 General Comments: pt very anxious upon arrival, asking about sleep positioning for optimal breathing, pt notes when he lays flat it is difficult to breathe, thinks it could be related to sleep apnea, MD present in room during discussion, notified of pt's concerns     General Comments       Exercises     Shoulder Instructions      Home Living Family/patient expects to be discharged to:: Private residence Living Arrangements: Non-relatives/Friends Available Help at Discharge: Friend(s) Type of Home: House       Home Layout: One level     Bathroom Shower/Tub: Occupational psychologist: Standard Bathroom Accessibility: No   Home Equipment: Shower seat (pt believes there is a shower seate in atleast 1 of the showers at friends house that he can use)   Additional Comments: Will be living with friend for a week and then plans to d/c home, at home he lives with family member who is unable to provide frequent assistance.      Prior Functioning/Environment Prior Level of Function : Independent/Modified Independent                        OT Problem List: Decreased strength;Decreased range of motion;Decreased activity tolerance;Impaired balance (sitting and/or standing);Pain      OT  Treatment/Interventions:      OT Goals(Current goals can be found in the care plan section) Acute Rehab OT Goals Patient Stated Goal: return home OT Goal Formulation: With patient Time For Goal Achievement: 08/22/21 Potential to Achieve Goals: Good  OT Frequency:     Barriers to D/C:            Co-evaluation              AM-PAC OT "6 Clicks" Daily Activity     Outcome Measure Help from another person eating meals?: None Help from another person taking care of personal grooming?: None Help from another person toileting, which includes using toliet, bedpan, or urinal?: None Help from another person bathing (including washing, rinsing, drying)?: A Little Help from another person to put on and taking off regular upper body clothing?: A Little Help from another person to put on and taking off regular lower body clothing?: A Little 6 Click Score: 21   End of Session Equipment Utilized During Treatment: Gait belt Nurse Communication: Mobility status  Activity Tolerance: Patient tolerated treatment well Patient left: in chair;with call bell/phone within reach  OT Visit Diagnosis: Unsteadiness on feet (R26.81);Other abnormalities of  gait and mobility (R26.89);Muscle weakness (generalized) (M62.81);Pain                Time: 9323-5573 OT Time Calculation (min): 26 min Charges:  OT General Charges $OT Visit: 1 Visit OT Evaluation $OT Eval Low Complexity: 1 Low OT Treatments $Self Care/Home Management : 8-22 mins  Lynnda Child, OTD, OTR/L Acute Rehab [phone]) [phone] - [place] 08/08/2021, 9:01 AM

## 2021-08-08 NOTE — Evaluation (Signed)
Physical Therapy Evaluation and Discharge Patient Details Name: Joshua Stephenson MRN: [id number] DOB: [birth year] Today's Date: 08/08/2021  History of Present Illness  Pt is 65 y.o. male s/p ACDF C4-5, C5-6, C6-7 on 11/17. PMH includes HTN, GERD, anxiety, depression, and bilateral shoulder arthroplasties (2019).   Clinical Impression  Patient evaluated by Physical Therapy with no further acute PT needs identified. All education has been completed and the patient has no further questions. Pt was able to demonstrate transfers and ambulation with gross modified independence and no AD. Pt was educated on precautions, brace application/wearing schedule, appropriate activity progression, and car transfer. See below for any follow-up Physical Therapy or equipment needs. PT is signing off. Thank you for this referral.        Recommendations for follow up therapy are one component of a multi-disciplinary discharge planning process, led by the attending physician.  Recommendations may be updated based on patient status, additional functional criteria and insurance authorization.  Follow Up Recommendations No PT follow up    Assistance Recommended at Discharge PRN  Functional Status Assessment Patient has had a recent decline in their functional status and demonstrates the ability to make significant improvements in function in a reasonable and predictable amount of time.  Equipment Recommendations  None recommended by PT    Recommendations for Other Services       Precautions / Restrictions Precautions Precautions: Cervical Precaution Booklet Issued: Yes (comment) Required Braces or Orthoses: Cervical Brace Restrictions Weight Bearing Restrictions: No      Mobility  Bed Mobility Overal bed mobility: Modified Independent             General bed mobility comments: HOB flat and rails lowered to simulate home environment.    Transfers Overall transfer level: Modified  independent Equipment used: None Transfers: Sit to/from Stand             General transfer comment: No unsteadiness or LOB noted with sit<>stand.    Ambulation/Gait Ambulation/Gait assistance: Modified independent (Device/Increase time) Gait Distance (Feet): 500 Feet Assistive device: None Gait Pattern/deviations: Step-through pattern;Decreased stride length Gait velocity: Decreased Gait velocity interpretation: <1.31 ft/sec, indicative of household ambulator   General Gait Details: Overall good posture and maintenance of precautions throughout Building control surveyor    Modified Rankin (Stroke Patients Only)       Balance Overall balance assessment: No apparent balance deficits (not formally assessed)                                           Pertinent Vitals/Pain Pain Assessment: Faces Faces Pain Scale: Hurts a little bit Pain Location: neck/ shoulders Pain Descriptors / Indicators: Constant;Discomfort Pain Intervention(s): Limited activity within patient's tolerance;Monitored during session;Repositioned    Home Living Family/patient expects to be discharged to:: Private residence Living Arrangements: Non-relatives/Friends Available Help at Discharge: Friend(s) Type of Home: House         Home Layout: One level Home Equipment: Shower seat (pt believes there is a shower seate in atleast 1 of the showers at friends house that he can use) Additional Comments: Will be living with friend for a week and then plans to d/c home, at home he lives with family member who is unable to provide frequent assistance.    Prior Function Prior Level of Function : Independent/Modified Independent  Hand Dominance        Extremity/Trunk Assessment   Upper Extremity Assessment Upper Extremity Assessment: Overall WFL for tasks assessed    Lower Extremity Assessment Lower Extremity Assessment:  Overall WFL for tasks assessed    Cervical / Trunk Assessment Cervical / Trunk Assessment: Neck Surgery  Communication   Communication: No difficulties  Cognition Arousal/Alertness: Awake/alert Behavior During Therapy: WFL for tasks assessed/performed Overall Cognitive Status: Within Functional Limits for tasks assessed                                          General Comments      Exercises     Assessment/Plan    PT Assessment Patient does not need any further PT services  PT Problem List         PT Treatment Interventions      PT Goals (Current goals can be found in the Care Plan section)  Acute Rehab PT Goals Patient Stated Goal: Home today, get back to his sister's house PT Goal Formulation: All assessment and education complete, DC therapy    Frequency     Barriers to discharge        Co-evaluation               AM-PAC PT "6 Clicks" Mobility  Outcome Measure Help needed turning from your back to your side while in a flat bed without using bedrails?: None Help needed moving from lying on your back to sitting on the side of a flat bed without using bedrails?: None Help needed moving to and from a bed to a chair (including a wheelchair)?: None Help needed standing up from a chair using your arms (e.g., wheelchair or bedside chair)?: None Help needed to walk in hospital room?: None Help needed climbing 3-5 steps with a railing? : A Little 6 Click Score: 23    End of Session Equipment Utilized During Treatment: Gait belt;Cervical collar Activity Tolerance: Patient tolerated treatment well Patient left: in bed;with call bell/phone within reach Nurse Communication: Mobility status PT Visit Diagnosis: Unsteadiness on feet (R26.81);Pain Pain - part of body:  (neck/shoulders)    Time: 5003-7048 PT Time Calculation (min) (ACUTE ONLY): 15 min   Charges:   PT Evaluation $PT Eval Low Complexity: 1 Low          Rolinda Roan, PT,  DPT Acute Rehabilitation Services Pager: [phone] Office: [phone]   Thelma Comp 08/08/2021, 12:01 PM

## 2021-08-08 NOTE — Anesthesia Postprocedure Evaluation (Signed)
Anesthesia Post Note  Patient: Joshua Stephenson  Procedure(s) Performed: Anterior Cervical Decompression/discectomy Fusion Cervical four five, Cervical five six, Cervical six seven     Patient location during evaluation: PACU Anesthesia Type: General Level of consciousness: awake and alert Pain management: pain level controlled Vital Signs Assessment: post-procedure vital signs reviewed and stable Respiratory status: spontaneous breathing, nonlabored ventilation, respiratory function stable and patient connected to nasal cannula oxygen Cardiovascular status: blood pressure returned to baseline and stable Postop Assessment: no apparent nausea or vomiting Anesthetic complications: no   No notable events documented.  Last Vitals:  Vitals:   08/07/21 2359 08/08/21 0604  BP: (!) 163/94 (!) 177/95  Pulse: 69 74  Resp: 20 20  Temp: 36.8 C 36.9 C  SpO2: 98% 97%    Last Pain:  Vitals:   08/08/21 0610  TempSrc:   PainSc: [place]

## 2021-08-08 NOTE — Discharge Summary (Signed)
Physician Discharge Summary  Patient ID: Joshua Stephenson MRN: [id number] DOB/AGE: Apr 18, 1956 65 y.o.  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 Joshua Stephenson is a 65 y.o. male electively admitted after ACDF. Postoperatively, the patient was at neurologic baseline, reporting improvement of arm pain. He was tolerating diet, ambulating independently, voiding normally, with pain controlled with oral medication.  Treatments: Surgery - ACDF C4-5, C5-6, C6-7  Discharge Exam: Blood pressure 138/85, pulse 73, temperature 98.3 F (36.8 C), temperature source Oral, resp. rate 16, height 5\' 7"  (1.702 m), weight 80.7 kg, SpO2 99 %. Awake, alert, oriented Speech fluent, appropriate CN grossly intact 5/5 BUE/BLE Wound c/d/i  Follow-up: Follow-up in my office [facility] [phone]) in 2-3 weeks  Disposition: Discharge disposition: 01-Home or Self Care       Discharge Instructions     Call MD for:  redness, tenderness, or signs of infection (pain, swelling, redness, odor or green/yellow discharge around incision site)   Complete by: As directed    Call MD for:  temperature >100.4   Complete by: As directed    Diet - low sodium heart healthy   Complete by: As directed    Discharge instructions   Complete by: As directed    Walk at home as much as possible, at least 4 times / day   Incentive spirometry RT   Complete by: As directed    Increase activity slowly   Complete by: As directed    Lifting restrictions   Complete by: As directed    No lifting > 10 lbs   May shower / Bathe   Complete by: As directed    48 hours after surgery   May walk up steps   Complete by: As directed    Other Restrictions    Complete by: As directed    No bending/twisting at waist   Remove dressing in 24 hours   Complete by: As directed       Allergies as of 08/08/2021   No Known Allergies      Medication List     TAKE these medications    albuterol 108 (90 Base) MCG/ACT inhaler Commonly known as: VENTOLIN HFA Inhale 1-2 puffs into the lungs every 6 (six) hours as needed for wheezing or shortness of breath.   aspirin EC 81 MG tablet Take 1 tablet (81 mg total) by mouth daily. Swallow whole. Start taking on: August 12, 2021 What changed: These instructions start on August 12, 2021. If you are unsure what to do until then, ask your doctor or other care provider.   atorvastatin 20 MG tablet Commonly known as: LIPITOR TAKE 1 TABLET BY MOUTH EVERYDAY AT BEDTIME   meloxicam 15 MG tablet Commonly known as: MOBIC Take 15 mg by mouth daily.   methocarbamol 500 MG tablet Commonly known as: ROBAXIN Take 1 tablet (500 mg total) by mouth every 8 (eight) hours as needed for muscle spasms.   metoprolol succinate 100 MG 24 hr tablet Commonly known as: TOPROL-XL Take 1 tablet (100 mg total) by mouth daily. Take with or immediately following a meal.   Oxycodone HCl 10 MG Tabs Take 1 tablet (10 mg total) by mouth every 6 (six) hours as needed for up to 7 days for severe pain ((score 7 to  10)).        Follow-up Information     Consuella Lose, MD Follow up.   Specialty: Neurosurgery Contact information: [address] [place] [address] [phone]                 Signed: Jairo Ben 08/08/2021, 8:16 AM

## 2021-08-08 NOTE — Plan of Care (Signed)
Patient alert and oriented, mae's well, voiding adequate amount of urine, swallowing without difficulty, no c/o pain at time of discharge. Patient discharged home with family. Script and discharged instructions given to patient. Patient and family stated understanding of instructions given. Patient has an appointment with Dr. Nundkumar in 2 weeks 

## 2021-08-11 ENCOUNTER — Encounter (HOSPITAL_COMMUNITY): Payer: Self-pay | Admitting: Neurosurgery

## 2021-08-13 ENCOUNTER — Ambulatory Visit
Admission: RE | Admit: 2021-08-13 | Discharge: 2021-08-13 | Disposition: A | Discharge status: Home / Self Care | Payer: Medicare HMO | Source: Ambulatory Visit | Attending: Pulmonary Disease | Admitting: Pulmonary Disease

## 2021-08-13 ENCOUNTER — Other Ambulatory Visit: Payer: Self-pay

## 2021-08-13 DIAGNOSIS — I2510 Atherosclerotic heart disease of native coronary artery without angina pectoris: Secondary | ICD-10-CM | POA: Diagnosis not present

## 2021-08-13 DIAGNOSIS — J432 Centrilobular emphysema: Secondary | ICD-10-CM | POA: Diagnosis not present

## 2021-08-13 DIAGNOSIS — J929 Pleural plaque without asbestos: Secondary | ICD-10-CM | POA: Diagnosis not present

## 2021-08-13 DIAGNOSIS — J8410 Pulmonary fibrosis, unspecified: Secondary | ICD-10-CM

## 2021-08-13 DIAGNOSIS — I700 Atherosclerosis of aorta: Secondary | ICD-10-CM | POA: Diagnosis not present

## 2021-08-15 ENCOUNTER — Telehealth: Payer: Self-pay | Admitting: Pulmonary Disease

## 2021-08-15 NOTE — Telephone Encounter (Signed)
Call report on HRCT done 08/13/21  Impression:  IMPRESSION: 1. No evidence of interstitial lung disease. Previously noted patchy areas of ground-glass attenuation have resolved. 2. Diffuse bronchial wall thickening with mild centrilobular and paraseptal emphysema; imaging findings suggestive of underlying COPD. 3. Increasing conspicuity of a right upper lobe pulmonary nodule. This may still proved to be benign, however, rather than an annual follow-up low-dose lung cancer screening chest CT in September 2023, and interval six-month follow-up lung cancer screening chest CT is recommended in March of 2023 to reassess this lesion and assess for stability. 4. Aortic atherosclerosis, in addition to left main and left anterior descending coronary artery disease. Please note that although the presence of coronary artery calcium documents the presence of coronary artery disease, the severity of this disease and any potential stenosis cannot be assessed on this non-gated CT examination. Assessment for potential risk factor modification, dietary therapy or pharmacologic therapy may be warranted, if clinically indicated.   These results will be called to the ordering clinician or representative by the Radiologist Assistant, and communication documented in the PACS or [employer].   Aortic Atherosclerosis (ICD10-I70.0) and Emphysema (ICD10-J43.9).     Electronically Signed   By: Vinnie Langton M.D.   On: 08/15/2021 11:21

## 2021-08-19 NOTE — Telephone Encounter (Signed)
I called and discussed the results of the CT scan. There is no evidence of interstitial lung disease with resolution of groundglass opacities.  The right upper lobe nodule looks conspicuous and we will order a follow-up low-dose screening CT in 6 months instead of 1 year

## 2021-08-20 NOTE — Telephone Encounter (Signed)
Update added to current low dose Chest CT order to schedule CT for 11/2021 instead of original date of 05/2022 per Dr Vaughan Browner. [facility] is aware to contact pt to schedule for 11/2021.

## 2021-08-29 NOTE — Progress Notes (Signed)
01/13/21- 64 yoM former smoker (20 pkyrs) for sleep evaluation courtesy of Dr Francesco Sor with concern of snoring, SOB Medical problem list includes COPD, HTN, GERD, Cervical Radiculopathy, Depression,  Epworth score- 19 Body weight today-187 lbs Covid vax- Flu vax- -----Sleep consult-  snoring, waking up in the middle of the night, sleepy during the day.  No sleep meds. Drinks P[employer] for caffeine. Denies ENT surgery.and cardiac disease. He reports hx asthma, but was smoker. Uses albuterol hfa about 1x/ day- helps. Denies significant wheeze or cough.  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  Bedtime around 10PM, sleep latency up to 2 hours, WASO 2-3x, up bet 9:30-10:00AM.  09/01/21- 55 yoM smoker (20pkyrs) followed for OSA, complicated by COPD, HTN, GERD, Cervical Radiculopathy, Depression, ?ILD( Dr Vaughan Browner), CAD,  HST 01/26/21- AHI 9.3/ hr, desaturation to 86%, body weight 187 lbs CPAP auto 5-15/ [facility]   ordered 06/27/21 Referred to Dr Ron Parker for oral appliance> too expensive. Dr Vaughan Browner saw for abnl CT ground glass- no ILD Spine surgery 11/17 for cervical stenosis Body weight today-176 lbs Covid vax-none Flu vax-no -----Patient states that sometimes when he is sitting or sleeping he sounds likes he is gargling. Patient states that he feels like his breathing is worse and has been having more trouble breathing during the day and using rescue inhaler more.  He has begun using CPAP and feels that helps.  Fullface mask.  We are contacting [facility] for download. CT scan results were reviewed including absence of interstitial lung disease, presence of mild COPD changes, presence of atherosclerosis.  Note that he has had carotid surgery for ASVD. He notes a feeling of stuttering or halting on expiration at times, eased by use of his albuterol rescue inhaler up to 3 times daily.  We are going to try a maintenance inhaler. PFT 04/30/21-minimal  obstruction, insignif resp to BD  FEV1/FVC 0.75  HRCT chest- 08/13/21 IMPRESSION: 1. No evidence of interstitial lung disease. Previously noted patchy areas of ground-glass attenuation have resolved. 2. Diffuse bronchial wall thickening with mild centrilobular and paraseptal emphysema; imaging findings suggestive of underlying COPD. 3. Increasing conspicuity of a right upper lobe pulmonary nodule. This may still proved to be benign, however, rather than an annual follow-up low-dose lung cancer screening chest CT in September 2023, and interval six-month follow-up lung cancer screening chest CT is recommended in March of 2023 to reassess this lesion and assess for stability. 4. Aortic atherosclerosis, in addition to left main and left anterior descending coronary artery disease. Please note that although the presence of coronary artery calcium documents the presence of coronary artery disease, the severity of this disease and any potential stenosis cannot be assessed on this non-gated CT examination. Assessment for potential risk factor modification, dietary therapy or pharmacologic therapy may be warranted, if clinically indicated.  ROS-see HPI   + = positive Constitutional:    weight loss, night sweats, fevers, chills, fatigue, lassitude. HEENT:    headaches, difficulty swallowing, tooth/dental problems, sore throat,       Sneezing,+ itching, ear ache, nasal congestion, post nasal drip, snoring CV:    chest pain, orthopnea, PND, swelling in lower extremities, anasarca,                                   dizziness, palpitations Resp:   +shortness of breath with exertion or at rest.  productive cough,   non-productive cough, coughing up of blood.              change in color of mucus.  wheezing.   Skin:    rash or lesions. GI:  + heartburn, indigestion, abdominal pain, nausea, vomiting, diarrhea,                 change in bowel habits, loss of appetite GU: dysuria,  change in color of urine, no urgency or frequency.   flank pain. MS:   joint pain, stiffness, decreased range of motion, back pain. Neuro-     nothing unusual Psych:  change in mood or affect.  depression or anxiety.   memory loss.  OBJ- Physical Exam General- Alert, Oriented, Affect-appropriate, Distress- none acute Skin- rash-none, lesions- none, excoriation- none Lymphadenopathy- none Head- atraumatic            Eyes- Gross vision intact, PERRLA, conjunctivae and secretions clear            Ears- Hearing, canals-normal            Nose- Clear, no-Septal dev, mucus, polyps, erosion, perforation             Throat- Mallampati III , mucosa clear , drainage- none, tonsils- atrophic,  + teeth Neck- flexible , trachea midline, no stridor , thyroid nl, carotid no bruit Chest - symmetrical excursion , unlabored           Heart/CV- RRR , no murmur , no gallop  , no rub, nl s1 s2                           - JVD- none , edema- none, stasis changes- none, varices- none           Lung- clear to P&A, wheeze- none, cough- none , dullness-none, rub- none           Chest wall-  Abd-  Br/ Gen/ Rectal- Not done, not indicated Extrem- cyanosis- none, clubbing, none, atrophy- none, strength- nl Neuro- grossly intact to observation

## 2021-09-01 ENCOUNTER — Ambulatory Visit (INDEPENDENT_AMBULATORY_CARE_PROVIDER_SITE_OTHER): Payer: Medicare HMO | Admitting: Internal Medicine

## 2021-09-01 ENCOUNTER — Encounter: Payer: Self-pay | Admitting: Internal Medicine

## 2021-09-01 ENCOUNTER — Other Ambulatory Visit: Payer: Self-pay

## 2021-09-01 DIAGNOSIS — G4733 Obstructive sleep apnea (adult) (pediatric): Secondary | ICD-10-CM | POA: Diagnosis not present

## 2021-09-01 DIAGNOSIS — F17200 Nicotine dependence, unspecified, uncomplicated: Secondary | ICD-10-CM | POA: Diagnosis not present

## 2021-09-01 DIAGNOSIS — J449 Chronic obstructive pulmonary disease, unspecified: Secondary | ICD-10-CM | POA: Diagnosis not present

## 2021-09-01 DIAGNOSIS — R911 Solitary pulmonary nodule: Secondary | ICD-10-CM | POA: Diagnosis not present

## 2021-09-01 MED ORDER — TRELEGY ELLIPTA 100-62.5-25 MCG/ACT IN AEPB: 1.0000 | INHALATION_SPRAY | Freq: Every day | RESPIRATORY_TRACT | 0 refills | Status: DC

## 2021-09-01 NOTE — Assessment & Plan Note (Signed)
Continue encouraging effort and intention to stop smoking

## 2021-09-01 NOTE — Assessment & Plan Note (Signed)
We discussed this nodule in a smoker. Plan-update CT chest in 6 months/March 2023

## 2021-09-01 NOTE — Patient Instructions (Addendum)
We will contact [facility], since you did get a CPAP machine, and get a download to see how it is working.   Order- schedule future CT chest  no contrast   to be done March, 2023    dx lung nodule  Sample trial Trelegy 100    inhale 1 puff then rinse mouth,once daily  Please call if we can help

## 2021-09-01 NOTE — Addendum Note (Signed)
Addended by: Elby Beck R on: 09/01/2021 05:21 PM   Modules accepted: Orders

## 2021-09-01 NOTE — Progress Notes (Signed)
Patient seen in the office today and instructed on use of Trelegy 100.  Patient expressed understanding and demonstrated technique.  

## 2021-09-01 NOTE — Assessment & Plan Note (Signed)
Benefits from CPAP with successful start, auto 5-15. Plan-continue CPAP auto 5-15

## 2021-09-01 NOTE — Assessment & Plan Note (Signed)
Smoking cessation is key.  He does notice benefit from rescue inhaler up to 3 times daily. Plan-trial sample Trelegy 100

## 2021-10-01 ENCOUNTER — Encounter: Payer: Self-pay | Admitting: Pulmonary Disease

## 2021-10-01 ENCOUNTER — Other Ambulatory Visit: Payer: Self-pay

## 2021-10-01 ENCOUNTER — Ambulatory Visit (INDEPENDENT_AMBULATORY_CARE_PROVIDER_SITE_OTHER): Payer: Medicare HMO | Admitting: Pulmonary Disease

## 2021-10-01 VITALS — BP 120/82 | HR 82 | Temp 98.2°F | Ht 67.0 in | Wt 177.8 lb

## 2021-10-01 DIAGNOSIS — G4733 Obstructive sleep apnea (adult) (pediatric): Secondary | ICD-10-CM

## 2021-10-01 DIAGNOSIS — J449 Chronic obstructive pulmonary disease, unspecified: Secondary | ICD-10-CM

## 2021-10-01 NOTE — Progress Notes (Signed)
Joshua Stephenson    [id number]    June 29, 1956  Primary Care Physician:Skakle, Liane Comber, DO  Referring Physician: Sueanne Margarita, [place] [place],  [place] [address]  Chief complaint: Consult for abnormal CT scan  HPI: Active smoker with history of emphysema, sleep apnea Referred here for abnormal screening CT of the chest He has dyspnea on exertion, denies any cough, sputum production, fevers, chills  He has history of sleep apnea and follows with Dr. Annamaria Boots.  At last visit he had been referred to Dr. Ron Parker for oral appliance Is scheduled for neck surgery by Dr. Kathyrn Sheriff next month  Pets: No pets Occupation: Retired Biochemist, clinical.  Disabled due to shoulder issues. Exposures: No mold, hot tub, Jacuzzi.  No feather pillows or comforter Smoking history: 45-pack-year smoker.  Continues to smoke half pack per day Travel history: No significant travel history Relevant family history: No family history of lung disease   Outpatient Encounter Medications as of 10/01/2021  Medication Sig   albuterol (VENTOLIN HFA) 108 (90 Base) MCG/ACT inhaler Inhale 1-2 puffs into the lungs every 6 (six) hours as needed for wheezing or shortness of breath.   aspirin EC 81 MG tablet Take 1 tablet (81 mg total) by mouth daily. Swallow whole.   atorvastatin (LIPITOR) 20 MG tablet TAKE 1 TABLET BY MOUTH EVERYDAY AT BEDTIME   Fluticasone-Umeclidin-Vilant (TRELEGY ELLIPTA) 100-62.5-25 MCG/ACT AEPB Inhale 1 puff into the lungs daily.   meloxicam (MOBIC) 15 MG tablet Take 15 mg by mouth daily.   metoprolol succinate (TOPROL-XL) 100 MG 24 hr tablet Take 1 tablet (100 mg total) by mouth daily. Take with or immediately following a meal.   methocarbamol (ROBAXIN) 500 MG tablet Take 1 tablet (500 mg total) by mouth every 8 (eight) hours as needed for muscle spasms. (Patient not taking: Reported on 10/01/2021)   [DISCONTINUED] dicyclomine (BENTYL) 20 MG tablet Take 1 tablet (20 mg total) by mouth 4  (four) times daily -  before meals and at bedtime. As needed for cramps and diarrhea   [DISCONTINUED] escitalopram (LEXAPRO) 10 MG tablet Take 1 tablet (10 mg total) by mouth daily.   [DISCONTINUED] triazolam (HALCION) 0.25 MG tablet Take 1 tab by mouth 1 hour prior to procedure with very light food, do not drive motor vehicle.   No facility-administered encounter medications on file as of 10/01/2021.    Allergies as of 10/01/2021   (No Known Allergies)    Past Medical History:  Diagnosis Date   Anxiety    Asthma    as a child   Atherosclerosis of aorta (HCC)    COPD, mild ([facility]) 06/07/2014   Coronary artery calcification    Depression    Dyspnea    GERD (gastroesophageal reflux disease)    Hemorrhoids    Hx of adenomatous colonic polyps 07/08/2021   2 diminutive - repeat exam 2029   Hypertension    Sleep apnea    in process of obtaining cpap    Past Surgical History:  Procedure Laterality Date   ANTERIOR CERVICAL DECOMP/DISCECTOMY FUSION N/A 08/07/2021   Procedure: Anterior Cervical Decompression/discectomy Fusion Cervical four five, Cervical five six, Cervical six seven;  Surgeon: Consuella Lose, MD;  Location: [facility];  Service: Neurosurgery;  Laterality: N/A;  3C   COLONOSCOPY  2011   SHOULDER ARTHROSCOPY W/ ROTATOR CUFF REPAIR Bilateral 02/08/2018    Family History  Problem Relation Age of Onset   Breast cancer Mother    Hypertension  Mother    Diabetes Father    Heart disease Father    Hypertension Father    Cancer Sister        type unknown   Breast cancer Sister    Cancer Brother        oral cancer    Cancer Brother        oral cancer    Breast cancer Niece    Breast cancer Niece    Colon cancer Neg Hx    Esophageal cancer Neg Hx    Rectal cancer Neg Hx    Stomach cancer Neg Hx    Colon polyps Neg Hx     Social History   Socioeconomic History   Marital status: Married    Spouse name: Not on file   Number of children: 1    Years of education:  12    Highest education level: Not on file  Occupational History   Occupation: Psychologist, occupational: [employer]  Tobacco Use   Smoking status: Every Day    Packs/day: 0.25    Years: 20.00    Pack years: 5.00    Types: Cigarettes   Smokeless tobacco: Never   Tobacco comments:    3 cigs per day 1/11/23lt  Vaping Use   Vaping Use: Never used  Substance and Sexual Activity   Alcohol use: Not Currently   Drug use: No   Sexual activity: Yes    Birth control/protection: None  Other Topics Concern   Not on file  Social History Narrative   Married   1 grown daughter (prior relationship I think) lives [place] [address]    He likes to be outside.    Works at [employer] for [employer]   Former smoker, no EtOH and no caffeine or drugs      Social Determinants of Radio broadcast assistant Strain: Not on [employer] Insecurity: Not on file  Transportation Needs: Not on file  Physical Activity: Not on file  Stress: Not on file  Social Connections: Not on file  Intimate Partner Violence: Not on file    Review of systems: Review of Systems  Constitutional: Negative for fever and chills.  HENT: Negative.   Eyes: Negative for blurred vision.  Respiratory: as per HPI  Cardiovascular: Negative for chest pain and palpitations.  Gastrointestinal: Negative for vomiting, diarrhea, blood per rectum. Genitourinary: Negative for dysuria, urgency, frequency and hematuria.  Musculoskeletal: Negative for myalgias, back pain and joint pain.  Skin: Negative for itching and rash.  Neurological: Negative for dizziness, tremors, focal weakness, seizures and loss of consciousness.  Endo/Heme/Allergies: Negative for environmental allergies.  Psychiatric/Behavioral: Negative for depression, suicidal ideas and hallucinations.  All other systems reviewed and are negative.  Physical Exam: Blood pressure 116/80, pulse 71, temperature 98.5 F (36.9 C), temperature source Oral,  height 5\' 7"  (1.702 m), weight 179 lb 6.4 oz (81.4 kg), SpO2 100 %. Gen:      No acute distress HEENT:  EOMI, sclera anicteric Neck:     No masses; no thyromegaly Lungs:    Clear to auscultation bilaterally; normal respiratory effort CV:         Regular rate and rhythm; no murmurs Abd:      + bowel sounds; soft, non-tender; no palpable masses, no distension Ext:    No edema; adequate peripheral perfusion Skin:      Warm and dry; no rash Neuro: alert and oriented x 3  Psych: normal mood and affect  Data Reviewed: Imaging: Screening CT chest 06/19/2021-centrilobular and paraseptal emphysema, patchy groundglass attenuation bilaterally.  I have reviewed the images personally  PFTs: 04/30/2021 FVC 3.17 [89%], FEV1 2.42 [88%], F/F 76 Obstructive airway disease, no bronchodilator response  Labs:  Assessment:  Abnormal CT I have reviewed the screening CT with mild peripheral groundglass opacities.  These appear upper lobe predominant Likely related to ongoing smoking. We will get high-res CT for better evaluation Check baseline labs including CBC differential, IgE, alpha-1 antitrypsin levels and phenotype ANA, rheumatoid factor, CCP  Active smoker Discussed smoking cessation in detail with patient.  He is using nicotine gum and would like to try and quit on his own Offered referral to cessation clinic but he declined Time spent counseling-5 minutes.  Reassess at return visit  [facility]A Referred for dental appliance Follows with Dr. Annamaria Boots  Plan/Recommendations: Labs High-res CT Smoking cessation  Marshell Garfinkel MD [facility] 10/01/2021, 1:42 PM  CC: Sueanne Margarita, DO

## 2021-10-01 NOTE — Progress Notes (Signed)
Joshua Stephenson    [id number]    03/31/1956  Primary Care Physician:Skakle, Liane Comber, DO  Referring Physician: Sueanne Margarita, [place],  [place] [address]  Chief complaint: Follow-up for abnormal CT scan  HPI: Active smoker with history of emphysema, sleep apnea Referred here for abnormal screening CT of the chest He has dyspnea on exertion, denies any cough, sputum production, fevers, chills  He has history of sleep apnea and follows with Dr. Annamaria Boots.  At last visit he had been referred to Dr. Ron Parker for oral appliance Is scheduled for neck surgery by Dr. Kathyrn Sheriff next month  Pets: No pets Occupation: Retired Biochemist, clinical.  Disabled due to shoulder issues. Exposures: No mold, hot tub, Jacuzzi.  No feather pillows or comforter Smoking history: 45-pack-year smoker.  Continues to smoke half pack per day Travel history: No significant travel history Relevant family history: No family history of lung disease  Interim history: Here for follow-up of high-resolution CT.  CT did not show any evidence of interstitial lung disease.  There is increased prominence of one of the lymph nodes and he has a follow-up screening CT ordered in 6 months  States that breathing is stable.  Continues to smoke  Outpatient Encounter Medications as of 10/01/2021  Medication Sig   albuterol (VENTOLIN HFA) 108 (90 Base) MCG/ACT inhaler Inhale 1-2 puffs into the lungs every 6 (six) hours as needed for wheezing or shortness of breath.   aspirin EC 81 MG tablet Take 1 tablet (81 mg total) by mouth daily. Swallow whole.   atorvastatin (LIPITOR) 20 MG tablet TAKE 1 TABLET BY MOUTH EVERYDAY AT BEDTIME   Fluticasone-Umeclidin-Vilant (TRELEGY ELLIPTA) 100-62.5-25 MCG/ACT AEPB Inhale 1 puff into the lungs daily.   meloxicam (MOBIC) 15 MG tablet Take 15 mg by mouth daily.   metoprolol succinate (TOPROL-XL) 100 MG 24 hr tablet Take 1 tablet (100 mg total) by mouth daily. Take with or  immediately following a meal.   methocarbamol (ROBAXIN) 500 MG tablet Take 1 tablet (500 mg total) by mouth every 8 (eight) hours as needed for muscle spasms. (Patient not taking: Reported on 10/01/2021)   [DISCONTINUED] dicyclomine (BENTYL) 20 MG tablet Take 1 tablet (20 mg total) by mouth 4 (four) times daily -  before meals and at bedtime. As needed for cramps and diarrhea   [DISCONTINUED] escitalopram (LEXAPRO) 10 MG tablet Take 1 tablet (10 mg total) by mouth daily.   [DISCONTINUED] triazolam (HALCION) 0.25 MG tablet Take 1 tab by mouth 1 hour prior to procedure with very light food, do not drive motor vehicle.   No facility-administered encounter medications on file as of 10/01/2021.   Physical Exam: Blood pressure 120/82, pulse 82, temperature 98.2 F (36.8 C), temperature source Oral, height 5\' 7"  (1.702 m), weight 177 lb 12.8 oz (80.6 kg), SpO2 97 %. Gen:      No acute distress HEENT:  EOMI, sclera anicteric Neck:     No masses; no thyromegaly Lungs:    Clear to auscultation bilaterally; normal respiratory effort CV:         Regular rate and rhythm; no murmurs Abd:      + bowel sounds; soft, non-tender; no palpable masses, no distension Ext:    No edema; adequate peripheral perfusion Skin:      Warm and dry; no rash Neuro: alert and oriented x 3 Psych: normal mood and affect   Data Reviewed: Imaging: Screening CT chest 06/19/2021-centrilobular and paraseptal  emphysema, patchy groundglass attenuation bilaterally.   High-resolution CT 08/15/2021-no evidence of ILD, emphysema, increasing conspicuity of a right upper lobe pulmonary nodule.  I have reviewed the images personally  PFTs: 04/30/2021 FVC 3.17 [89%], FEV1 2.42 [88%], F/F 76 Obstructive airway disease, no bronchodilator response  Labs: CBC 07/30/2021-WBC 5.6, eos 2.8%, absolute eosinophil count 157 IgE 07/24/2021-263 Alpha-1 antitrypsin 09/23/2020-169, PI MM  ANA, CCP, rheumatoid factor XI/III/XX  2-negative  Assessment:  Abnormal CT The screening CT with mild peripheral groundglass opacities.  However follow-up high-res CT does not show any significant interstitial lung disease.  COPD, active smoker Continues on Trelegy inhaler.  Not interested in quitting smoking.  Lung nodule Appears more prominent on last CT chest.  He has a follow-up low-dose CT ordered in 6 months  [facility]A Referred for dental appliance Follows with Dr. Annamaria Boots  Since there is no evidence of interstitial lung disease he does not need to follow-up in my clinic He is already established with Dr. Annamaria Boots and can follow-up in his clinic  Plan/Recommendations: Return to interstitial lung disease clinic as needed  Marshell Garfinkel MD [facility] 10/01/2021, 1:40 PM  CC: Sueanne Margarita, DO

## 2021-10-01 NOTE — Patient Instructions (Addendum)
Entirely doing well with your breathing Continue the inhaler, work on smoking cessation You can follow with Dr. Annamaria Boots.  Return to ILD clinic as needed

## 2021-10-13 ENCOUNTER — Telehealth: Payer: Self-pay | Admitting: Internal Medicine

## 2021-10-14 ENCOUNTER — Other Ambulatory Visit: Payer: Self-pay

## 2021-10-14 MED ORDER — TRELEGY ELLIPTA 100-62.5-25 MCG/ACT IN AEPB: 1.0000 | INHALATION_SPRAY | Freq: Every day | RESPIRATORY_TRACT | 2 refills | Status: DC

## 2021-10-14 MED ORDER — IPRATROPIUM-ALBUTEROL 0.5-2.5 (3) MG/3ML IN SOLN: 3.0000 mL | Freq: Four times a day (QID) | RESPIRATORY_TRACT | 12 refills | Status: DC | PRN

## 2021-10-14 NOTE — Telephone Encounter (Signed)
Called and spoke with patient and he stated that every now and then he gets really short of breath and has a difficult time getting air in and that he uses his rescue inhaler but it does not help him he states that it takes several hours for it to help. He was wanting to know if there was something else that could be given to him for emergencies.   Dr Annamaria Boots please advise

## 2021-10-14 NOTE — Telephone Encounter (Signed)
Called and spoke with patient and verified that he already has a neb machine and that he would like the medication of DuoNeb sent in to his pharmacy on file. I instructed him on how to use the medication and how often. Patient has no questions or concerns  Nothing further at this time

## 2021-10-14 NOTE — Telephone Encounter (Signed)
Please order him a compressor nebulizer machine and Duoneb, 25 vials  ref x 12   Use 1 neb every 6 hours if needed.

## 2021-11-23 ENCOUNTER — Other Ambulatory Visit: Payer: Self-pay | Admitting: Adult Health

## 2021-12-04 DIAGNOSIS — H6503 Acute serous otitis media, bilateral: Secondary | ICD-10-CM | POA: Diagnosis not present

## 2021-12-08 ENCOUNTER — Other Ambulatory Visit: Payer: Self-pay

## 2021-12-08 ENCOUNTER — Ambulatory Visit
Admission: RE | Admit: 2021-12-08 | Discharge: 2021-12-08 | Disposition: A | Discharge status: Home / Self Care | Payer: Medicare HMO | Source: Ambulatory Visit | Attending: Internal Medicine | Admitting: Internal Medicine

## 2021-12-08 DIAGNOSIS — R911 Solitary pulmonary nodule: Secondary | ICD-10-CM | POA: Diagnosis not present

## 2021-12-08 DIAGNOSIS — J439 Emphysema, unspecified: Secondary | ICD-10-CM | POA: Diagnosis not present

## 2021-12-23 ENCOUNTER — Other Ambulatory Visit: Payer: Self-pay | Admitting: *Deleted

## 2022-01-02 DIAGNOSIS — Z125 Encounter for screening for malignant neoplasm of prostate: Secondary | ICD-10-CM | POA: Diagnosis not present

## 2022-01-02 DIAGNOSIS — R7989 Other specified abnormal findings of blood chemistry: Secondary | ICD-10-CM | POA: Diagnosis not present

## 2022-01-02 DIAGNOSIS — I10 Essential (primary) hypertension: Secondary | ICD-10-CM | POA: Diagnosis not present

## 2022-01-02 DIAGNOSIS — R7303 Prediabetes: Secondary | ICD-10-CM | POA: Diagnosis not present

## 2022-01-05 DIAGNOSIS — H6503 Acute serous otitis media, bilateral: Secondary | ICD-10-CM | POA: Diagnosis not present

## 2022-01-06 DIAGNOSIS — R82998 Other abnormal findings in urine: Secondary | ICD-10-CM | POA: Diagnosis not present

## 2022-01-06 DIAGNOSIS — I700 Atherosclerosis of aorta: Secondary | ICD-10-CM | POA: Diagnosis not present

## 2022-01-06 DIAGNOSIS — Z1331 Encounter for screening for depression: Secondary | ICD-10-CM | POA: Diagnosis not present

## 2022-01-06 DIAGNOSIS — I10 Essential (primary) hypertension: Secondary | ICD-10-CM | POA: Diagnosis not present

## 2022-01-06 DIAGNOSIS — J449 Chronic obstructive pulmonary disease, unspecified: Secondary | ICD-10-CM | POA: Diagnosis not present

## 2022-01-06 DIAGNOSIS — Z23 Encounter for immunization: Secondary | ICD-10-CM | POA: Diagnosis not present

## 2022-01-06 DIAGNOSIS — I25118 Atherosclerotic heart disease of native coronary artery with other forms of angina pectoris: Secondary | ICD-10-CM | POA: Diagnosis not present

## 2022-01-06 DIAGNOSIS — F331 Major depressive disorder, recurrent, moderate: Secondary | ICD-10-CM | POA: Diagnosis not present

## 2022-01-06 DIAGNOSIS — Z0000 Encounter for general adult medical examination without abnormal findings: Secondary | ICD-10-CM | POA: Diagnosis not present

## 2022-01-06 DIAGNOSIS — G4733 Obstructive sleep apnea (adult) (pediatric): Secondary | ICD-10-CM | POA: Diagnosis not present

## 2022-01-06 DIAGNOSIS — G9589 Other specified diseases of spinal cord: Secondary | ICD-10-CM | POA: Diagnosis not present

## 2022-01-06 DIAGNOSIS — Z1339 Encounter for screening examination for other mental health and behavioral disorders: Secondary | ICD-10-CM | POA: Diagnosis not present

## 2022-01-06 DIAGNOSIS — R8281 Pyuria: Secondary | ICD-10-CM | POA: Diagnosis not present

## 2022-02-18 DIAGNOSIS — R293 Abnormal posture: Secondary | ICD-10-CM | POA: Diagnosis not present

## 2022-02-18 DIAGNOSIS — M79641 Pain in right hand: Secondary | ICD-10-CM | POA: Diagnosis not present

## 2022-02-18 DIAGNOSIS — M2569 Stiffness of other specified joint, not elsewhere classified: Secondary | ICD-10-CM | POA: Diagnosis not present

## 2022-02-18 DIAGNOSIS — M542 Cervicalgia: Secondary | ICD-10-CM | POA: Diagnosis not present

## 2022-02-24 DIAGNOSIS — M65342 Trigger finger, left ring finger: Secondary | ICD-10-CM | POA: Diagnosis not present

## 2022-02-24 DIAGNOSIS — M6281 Muscle weakness (generalized): Secondary | ICD-10-CM | POA: Diagnosis not present

## 2022-02-24 DIAGNOSIS — M25642 Stiffness of left hand, not elsewhere classified: Secondary | ICD-10-CM | POA: Diagnosis not present

## 2022-02-24 DIAGNOSIS — M25542 Pain in joints of left hand: Secondary | ICD-10-CM | POA: Diagnosis not present

## 2022-02-25 DIAGNOSIS — M65342 Trigger finger, left ring finger: Secondary | ICD-10-CM | POA: Diagnosis not present

## 2022-02-25 DIAGNOSIS — M25642 Stiffness of left hand, not elsewhere classified: Secondary | ICD-10-CM | POA: Diagnosis not present

## 2022-02-25 DIAGNOSIS — M25542 Pain in joints of left hand: Secondary | ICD-10-CM | POA: Diagnosis not present

## 2022-02-25 DIAGNOSIS — M6281 Muscle weakness (generalized): Secondary | ICD-10-CM | POA: Diagnosis not present

## 2022-02-27 NOTE — Progress Notes (Unsigned)
HPI M former smoker (20pkyrs) followed for OSA, complicated by COPD, HTN, GERD, Cervical Radiculopathy, Depression, Lung Nodule,  CAD,  HST 01/26/21- AHI 9.3/ hr, desaturation to 86%, body weight 187 lbs CPAP auto 5-15/ [facility]   ordered 06/27/21 PFT 04/30/21-minimal obstruction, insignif resp to BD  FEV1/FVC 0.75 =========================================================== .  09/01/21- 89 yoM smoker (20pkyrs) followed for OSA, complicated by COPD, HTN, GERD, Cervical Radiculopathy, Depression,Lung Nodule CAD,  HST 01/26/21- AHI 9.3/ hr, desaturation to 86%, body weight 187 lbs CPAP auto 5-15/ [facility]   ordered 06/27/21 Referred to Dr Ron Parker for oral appliance> too expensive. Dr Vaughan Browner saw for abnl CT ground glass- no ILD Spine surgery 11/17 for cervical stenosis Body weight today-176 lbs Covid vax-none Flu vax-no -----Patient states that sometimes when he is sitting or sleeping he sounds likes he is gargling. Patient states that he feels like his breathing is worse and has been having more trouble breathing during the day and using rescue inhaler more.  He has begun using CPAP and feels that helps.  Fullface mask.  We are contacting [facility] for download. CT scan results were reviewed including absence of interstitial lung disease, presence of mild COPD changes, presence of atherosclerosis.  Note that he has had carotid surgery for ASVD. He notes a feeling of stuttering or halting on expiration at times, eased by use of his albuterol rescue inhaler up to 3 times daily.  We are going to try a maintenance inhaler. PFT 04/30/21-minimal obstruction, insignif resp to BD  FEV1/FVC 0.75 HRCT chest- 08/13/21 IMPRESSION: 1. No evidence of interstitial lung disease. Previously noted patchy areas of ground-glass attenuation have resolved. 2. Diffuse bronchial wall thickening with mild centrilobular and paraseptal emphysema; imaging findings suggestive of underlying COPD. 3. Increasing conspicuity of a right upper  lobe pulmonary nodule. This may still proved to be benign, however, rather than an annual follow-up low-dose lung cancer screening chest CT in September 2023, and interval six-month follow-up lung cancer screening chest CT is recommended in March of 2023 to reassess this lesion and assess for stability. 4. Aortic atherosclerosis, in addition to left main and left anterior descending coronary artery disease. Please note that although the presence of coronary artery calcium documents the presence of coronary artery disease, the severity of this disease and any potential stenosis cannot be assessed on this non-gated CT examination. Assessment for potential risk factor modification, dietary therapy or pharmacologic therapy may be warranted, if clinically indicated.  03/02/22- 58 yoM smoker (20pkyrs) followed for OSA, complicated by COPD, HTN, GERD, Cervical Radiculopathy, Depression,Lung Nodule,  CAD,  -Trelegy100, Neb Duoneb,  Body weight today- CPAP [facility] Download- Covid vax-    CT chest 12/08/21-  IMPRESSION: 1. 8 x 5 mm posterior right upper lobe pulmonary nodule is stable comparing back to lung cancer screening chest CT of 06/19/2021. Given 6 months of imaging stability, repeat CT chest without contrast in 6-12 months recommended to ensure continued stability. 2. Aortic Atherosclerosis (ICD10-I70.0) and Emphysema (ICD10-J43.9).  ROS-see HPI   + = positive Constitutional:    weight loss, night sweats, fevers, chills, fatigue, lassitude. HEENT:    headaches, difficulty swallowing, tooth/dental problems, sore throat,       Sneezing,+ itching, ear ache, nasal congestion, post nasal drip, snoring CV:    chest pain, orthopnea, PND, swelling in lower extremities, anasarca,  dizziness, palpitations Resp:   +shortness of breath with exertion or at rest.                productive cough,   non-productive cough, coughing up of blood.              change in  color of mucus.  wheezing.   Skin:    rash or lesions. GI:  + heartburn, indigestion, abdominal pain, nausea, vomiting, diarrhea,                 change in bowel habits, loss of appetite GU: dysuria, change in color of urine, no urgency or frequency.   flank pain. MS:   joint pain, stiffness, decreased range of motion, back pain. Neuro-     nothing unusual Psych:  change in mood or affect.  depression or anxiety.   memory loss.  OBJ- Physical Exam General- Alert, Oriented, Affect-appropriate, Distress- none acute Skin- rash-none, lesions- none, excoriation- none Lymphadenopathy- none Head- atraumatic            Eyes- Gross vision intact, PERRLA, conjunctivae and secretions clear            Ears- Hearing, canals-normal            Nose- Clear, no-Septal dev, mucus, polyps, erosion, perforation             Throat- Mallampati III , mucosa clear , drainage- none, tonsils- atrophic,  + teeth Neck- flexible , trachea midline, no stridor , thyroid nl, carotid no bruit Chest - symmetrical excursion , unlabored           Heart/CV- RRR , no murmur , no gallop  , no rub, nl s1 s2                           - JVD- none , edema- none, stasis changes- none, varices- none           Lung- clear to P&A, wheeze- none, cough- none , dullness-none, rub- none           Chest wall-  Abd-  Br/ Gen/ Rectal- Not done, not indicated Extrem- cyanosis- none, clubbing, none, atrophy- none, strength- nl Neuro- grossly intact to observation

## 2022-03-02 ENCOUNTER — Encounter: Payer: Self-pay | Admitting: Internal Medicine

## 2022-03-02 ENCOUNTER — Ambulatory Visit (INDEPENDENT_AMBULATORY_CARE_PROVIDER_SITE_OTHER): Payer: Medicare HMO | Admitting: Internal Medicine

## 2022-03-02 VITALS — BP 126/70 | HR 69 | Temp 98.0°F | Ht 67.0 in | Wt 177.6 lb

## 2022-03-02 DIAGNOSIS — R911 Solitary pulmonary nodule: Secondary | ICD-10-CM

## 2022-03-02 DIAGNOSIS — F17200 Nicotine dependence, unspecified, uncomplicated: Secondary | ICD-10-CM

## 2022-03-02 DIAGNOSIS — J449 Chronic obstructive pulmonary disease, unspecified: Secondary | ICD-10-CM

## 2022-03-02 DIAGNOSIS — G4733 Obstructive sleep apnea (adult) (pediatric): Secondary | ICD-10-CM

## 2022-03-02 MED ORDER — TRELEGY ELLIPTA 100-62.5-25 MCG/ACT IN AEPB: 1.0000 | INHALATION_SPRAY | Freq: Every day | RESPIRATORY_TRACT | 12 refills | Status: DC

## 2022-03-02 NOTE — Assessment & Plan Note (Signed)
Needs help to meet compliance goals.  We can address mask fit and consider whether his machine needs service. Plan-continue AutoPap 5-15.  Referral for mask fitting/desensitization

## 2022-03-02 NOTE — Assessment & Plan Note (Signed)
We discussed surveillance of right upper lobe nodule. Plan-CT chest in 6 months

## 2022-03-02 NOTE — Patient Instructions (Signed)
Order- referral to sleep center for mask fitting/ desensitization  Order- CT chest no contrast in 6 months      dx lung nodule   Script sent the Trelegy inhaler once a day using when needed refill written refilling your Trelegy 100 daily maintenance inhaler

## 2022-03-02 NOTE — Assessment & Plan Note (Signed)
He says he is trying hard to quit "3 cigarettes daily".  I pressed him a bit on this.

## 2022-03-02 NOTE — Assessment & Plan Note (Signed)
Where he wheezes some without acute exacerbation. Plan-prescription for Trelegy to replace the sample he had tried.  Continue nebulizer machine, rescue inhaler.

## 2022-04-21 ENCOUNTER — Ambulatory Visit (HOSPITAL_BASED_OUTPATIENT_CLINIC_OR_DEPARTMENT_OTHER): Payer: Medicare HMO | Attending: Internal Medicine | Admitting: Internal Medicine

## 2022-04-21 DIAGNOSIS — G4733 Obstructive sleep apnea (adult) (pediatric): Secondary | ICD-10-CM

## 2022-05-02 DIAGNOSIS — G4733 Obstructive sleep apnea (adult) (pediatric): Secondary | ICD-10-CM | POA: Diagnosis not present

## 2022-05-31 ENCOUNTER — Other Ambulatory Visit (HOSPITAL_BASED_OUTPATIENT_CLINIC_OR_DEPARTMENT_OTHER): Payer: Self-pay | Admitting: Internal Medicine

## 2022-06-02 DIAGNOSIS — G4733 Obstructive sleep apnea (adult) (pediatric): Secondary | ICD-10-CM | POA: Diagnosis not present

## 2022-06-05 ENCOUNTER — Telehealth: Payer: Self-pay

## 2022-06-05 NOTE — Patient Outreach (Signed)
  Care Coordination   Initial Visit Note   06/05/2022 Name: Joshua Stephenson MRN: [id number] DOB: Apr 28, 1956  Joshua Stephenson is a 66 y.o. year old male who sees Sueanne Margarita, [place] for primary care. I spoke with  Meyer Russel [place] by phone today.  What matters to the patients health and wellness today?  Patient report he has noticed some swelling to his feet for past two weeks. Thinks he has gained wgt but unsure of wgt. RN CM reviewed and discussed fluid retention/swelling signs and sx mgmt measures. He endorses that eats low salt diet. He does not elevate feet-encouraged to do so. Patient also states that he has been unable to tolerate using CPAP. He has cardiology appt next week and will discuss these issues with MD. Patient has [facility] Medicare and Medicaid-advised patient that he is eligible for CM services as well as several other benefits/services that he was not aware of. He will contact them and follow up.    Goals Addressed             This Visit's Progress    Care Coordination-no follow up required       Care Coordination Interventions: Advised patient to contact [facility] services re: eligible benefits/services he is eligible for Advised patient to discuss swelling, CPAP usage with provider Assessed social determinant of health barriers Reviewed health maintenance measures including immunizations          SDOH assessments and interventions completed:  Yes  SDOH Interventions Today    Flowsheet Row Most Recent Value  SDOH Interventions   Food Insecurity Interventions Intervention Not Indicated  Transportation Interventions Intervention Not Indicated        Care Coordination Interventions Activated:  Yes  Care Coordination Interventions:  Yes, provided   Follow up plan: No further intervention required.   Encounter Outcome:  Pt. Visit Completed   Enzo Montgomery, RN,BSN,CCM [facility] Telephonic Care Management  Coordinator Direct Phone: [phone] Toll Free: [phone] Fax: [phone]

## 2022-06-10 ENCOUNTER — Encounter: Payer: Self-pay | Admitting: Cardiology

## 2022-06-10 ENCOUNTER — Ambulatory Visit: Payer: Medicare HMO | Admitting: Cardiology

## 2022-06-10 ENCOUNTER — Other Ambulatory Visit: Payer: Medicare HMO

## 2022-06-10 VITALS — BP 138/70 | HR 72 | Temp 98.5°F | Ht 67.0 in | Wt 180.0 lb

## 2022-06-10 DIAGNOSIS — R002 Palpitations: Secondary | ICD-10-CM

## 2022-06-10 DIAGNOSIS — I2510 Atherosclerotic heart disease of native coronary artery without angina pectoris: Secondary | ICD-10-CM | POA: Diagnosis not present

## 2022-06-10 DIAGNOSIS — Z87891 Personal history of nicotine dependence: Secondary | ICD-10-CM

## 2022-06-10 DIAGNOSIS — Z8249 Family history of ischemic heart disease and other diseases of the circulatory system: Secondary | ICD-10-CM | POA: Diagnosis not present

## 2022-06-10 DIAGNOSIS — I2584 Coronary atherosclerosis due to calcified coronary lesion: Secondary | ICD-10-CM | POA: Diagnosis not present

## 2022-06-10 DIAGNOSIS — I700 Atherosclerosis of aorta: Secondary | ICD-10-CM | POA: Diagnosis not present

## 2022-06-10 DIAGNOSIS — I10 Essential (primary) hypertension: Secondary | ICD-10-CM | POA: Diagnosis not present

## 2022-06-10 MED ORDER — ATORVASTATIN CALCIUM 20 MG PO TABS: ORAL_TABLET | ORAL | 0 refills | Status: DC

## 2022-06-10 MED ORDER — ASPIRIN 81 MG PO TBEC
81.0000 mg | DELAYED_RELEASE_TABLET | Freq: Every day | ORAL | 12 refills | Status: AC
Start: 2022-06-10 — End: ?

## 2022-06-10 NOTE — Progress Notes (Signed)
Date:  06/10/2022   ID:  [name], DOB 04-09-1956, MRN [id number]  PCP:  Sueanne Margarita, DO  Cardiologist:  Rex Kras, DO, [facility] (established care October 01, 2020)  Date: 06/10/22 Last Office Visit: 06/10/2021   Chief Complaint  Patient presents with   Follow-up    Annual follow-up visit. Palpitations    HPI  Joshua Stephenson is a 66 y.o. male whose past medical history and cardiovascular risk factors include: Benign essential hypertension, former smoker (69-pack-years hx of smoking), COPD, family history of premature CAD, aortic atherosclerosis, mild coronary artery calcification and minimal nonobstructive CAD.  In the past he was referred to the practice for evaluation of chest pain and underwent ischemic evaluation as outlined below.  He now presents today for 1 year follow-up visit.  Over the last 1 year patient has done well from a cardiovascular standpoint.  He recently underwent cervical spine fusion surgery unremarkably and still recovering.  He has been noticing episodes of palpitations and having 3-4 times a day lasting about 20 minutes each.  No precipitating factors.  They are usually self-limited.  He drinks at least 6 bottles of soda on a daily basis.  He denies any use of coffee, recreational drugs, alcohol, energy pills/supplements, weight loss supplements, stimulants.  Patient states that the symptoms are concerning enough and is requesting further work-up.  Stopped all meds for one years after he got divorced this includes aspirin, Lipitor, Toprol XL 100 mg p.o. daily.  FUNCTIONAL STATUS: No structured exercise program or daily routine.  ALLERGIES: No Known Allergies  MEDICATION LIST PRIOR TO VISIT: Current Meds  Medication Sig   albuterol (VENTOLIN HFA) 108 (90 Base) MCG/ACT inhaler INHALE 1 TO 2 PUFFS INTO THE LUNGS EVERY 6 HOURS AS NEEDED FOR WHEEZING OR SHORTNESS OF BREATH   aspirin EC 81 MG tablet Take 1 tablet (81 mg total) by mouth  daily. Swallow whole.     PAST MEDICAL HISTORY: Past Medical History:  Diagnosis Date   Anxiety    Asthma    as a child   Atherosclerosis of aorta (HCC)    COPD, mild ([place]) 06/07/2014   Coronary artery calcification    Depression    Dyspnea    GERD (gastroesophageal reflux disease)    Hemorrhoids    Hx of adenomatous colonic polyps 07/08/2021   2 diminutive - repeat exam 2029   Hypertension    Sleep apnea    in process of obtaining cpap    PAST SURGICAL HISTORY: Past Surgical History:  Procedure Laterality Date   ANTERIOR CERVICAL DECOMP/DISCECTOMY FUSION N/A 08/07/2021   Procedure: Anterior Cervical Decompression/discectomy Fusion Cervical four five, Cervical five six, Cervical six seven;  Surgeon: Consuella Lose, MD;  Location: [place] [facility];  Service: Neurosurgery;  Laterality: N/A;  3C   COLONOSCOPY  2011   SHOULDER ARTHROSCOPY W/ ROTATOR CUFF REPAIR Bilateral 02/08/2018    FAMILY HISTORY: The patient family history includes Breast cancer in his mother, niece, niece, and sister; Cancer in his brother, brother, and sister; Diabetes in his father; Heart disease in his father; Hypertension in his father and mother.  SOCIAL HISTORY:  The patient  reports that he has been smoking cigarettes. He has a 5.00 pack-year smoking history. He has never used smokeless tobacco. He reports that he does not currently use alcohol. He reports that he does not use drugs.  REVIEW OF SYSTEMS: Review of Systems  Cardiovascular:  Positive for palpitations. Negative for chest pain, claudication, dyspnea on exertion,  irregular heartbeat, leg swelling, near-syncope, orthopnea, paroxysmal nocturnal dyspnea and syncope.  Respiratory:  Negative for shortness of breath.   Hematologic/Lymphatic: Negative for bleeding problem.  Musculoskeletal:  Negative for muscle cramps and myalgias.  Neurological:  Negative for dizziness and light-headedness.    PHYSICAL EXAM:    06/10/2022    9:43 AM 04/21/2022    12:00 PM 03/02/2022    9:52 AM  Vitals with BMI  Height _0  _1  _2   Weight 180 lbs 177 lbs 177 lbs 10 oz  BMI 28.19 19.37 90.24  Systolic 097  353  Diastolic 70  70  Pulse 72  69   Physical Exam  Constitutional: No distress.  Age appropriate, hemodynamically stable.   Neck: No JVD present.  Cardiovascular: Normal rate, regular rhythm, S1 normal, S2 normal, intact distal pulses and normal pulses. Exam reveals no gallop, no S3 and no S4.  No murmur heard. Pulmonary/Chest: Effort normal and breath sounds normal. No stridor. He has no wheezes. He has no rales.  Abdominal: Soft. Bowel sounds are normal. He exhibits no distension. There is no abdominal tenderness.  Musculoskeletal:        General: No edema.     Cervical back: Neck supple.  Neurological: He is alert and oriented to person, place, and time. He has intact cranial nerves (2-12).  Skin: Skin is warm and moist.   CARDIAC DATABASE: EKG: 06/10/2022: Normal sinus rhythm, 73 bpm, without underlying ischemia or injury pattern.  Echocardiogram: 10/10/2020: Normal LV systolic function with visual EF 55-60%. Left ventricle cavity is normal in size. Moderate left ventricular hypertrophy. Normal global wall motion. No significant valvular heart disease. No prior study for comparison   Stress Testing: No results found for this or any previous visit from the past 1095 days.  CCTA:  10/23/2020:  Total calcium score: 19 AU, which places the patient in the 63th percentile for age and sex matched control. Normal coronary origin with right dominance. CAD-RADS = 1. Minimal stenosis in distal LAD, Minimal stenosis in ostial LCX. Aortic atherosclerosis.   Heart Catheterization: None  LABORATORY DATA:    Latest Ref Rng & Units 07/30/2021    9:21 AM 04/04/2018   10:24 AM 10/06/2017    3:16 PM  CBC  WBC 4.0 - 10.5 K/uL 5.6  6.4  4.9   Hemoglobin 13.0 - 17.0 g/dL 15.3  14.2  13.8   Hematocrit 39.0 - 52.0 % 44.3  40.5  39.6    Platelets 150.0 - 400.0 K/uL 220.0  279.0  236.0        Latest Ref Rng & Units 07/31/2021   12:00 PM 11/05/2020   11:47 AM 10/18/2020    9:19 AM  CMP  Glucose 70 - 99 mg/dL 98  78  153   BUN 8 - 23 mg/dL _3 Creatinine 0.61 - 1.24 mg/dL 1.14  1.25  1.24   Sodium 135 - 145 mmol/L 138  140  142   Potassium 3.5 - 5.1 mmol/L 3.8  4.0  4.1   Chloride 98 - 111 mmol/L 105  103  99   CO2 22 - 32 mmol/L _4 Calcium 8.9 - 10.3 mg/dL 9.1  9.9  9.8     Lipid Panel     Component Value Date/Time   CHOL 162 06/16/2017 1717   TRIG 128.0 06/16/2017 1717   HDL 47.90 06/16/2017 1717   CHOLHDL 3 06/16/2017 1717  VLDL 25.6 06/16/2017 1717   LDLCALC 89 06/16/2017 1717    No components found for: "NTPROBNP" No results for input(s): "PROBNP" in the last 8760 hours. No results for input(s): "TSH" in the last 8760 hours.  BMP Recent Labs    07/31/21 1200  NA 138  K 3.8  CL 105  CO2 27  GLUCOSE 98  BUN 10  CREATININE 1.14  CALCIUM 9.1  GFRNONAA >60    HEMOGLOBIN A1C Lab Results  Component Value Date   HGBA1C 5.3 06/16/2017   External Labs: Collected: 09/12/2020 Creatinine 1.2 mg/dL. eGFR: 74 mL/min per 1.73 m Sodium 140, potassium 4.1, chloride 105, bicarb 23, AST 17, ALT 18, alkaline phosphatase 91 Hemoglobin 17 g/dL, hematocrit 47.6% Lipid profile: Total cholesterol 149, triglycerides 81, HDL 39, LDL 94 TSH: 1.02  IMPRESSION:    ICD-10-CM   1. Palpitations  R00.2 TSH    Hemoglobin and hematocrit, blood    Basic metabolic panel    LONG TERM MONITOR (3-14 DAYS)    2. Coronary atherosclerosis due to calcified coronary lesion  I25.10 atorvastatin (LIPITOR) 20 MG tablet   I25.84 aspirin EC 81 MG tablet    3. Atherosclerosis of aorta (HCC)  I70.0     4. Benign hypertension  I10 EKG 12-Lead    5. Former smoker  Z87.891     77. Family history of premature CAD  Z82.49        RECOMMENDATIONS: Keno Caraway is a 66 y.o. male whose past  medical history and cardiac risk factors include: Benign essential hypertension, former smoker (69-pack-years hx of smoking), COPD, family history of premature CAD, aortic atherosclerosis, mild coronary artery calcification.  Palpitations Likely secondary to increased caffeine and sugar intake.  Drinks approximately 4-6 bottles of soda regularly along with Kool-Aid.  Check TSH. Check BMP. 2 week extended Holter monitor to evaluate for dysrhythmias. He was on Toprol-XL in the past but for reasons unknown discontinued. EKG nonischemic.  Coronary atherosclerosis due to calcified coronary lesion Total CAC 19. Minimal nonobstructive CAD based on his prior coronary CTA. Denies angina pectoris. EKG: Nonischemic. We will represcribe aspirin and cholesterol medications as he stopped all meds a year ago.   Benign hypertension Office blood pressures are well controlled. Currently managed by primary care provider.  FINAL MEDICATION LIST END OF ENCOUNTER: Meds ordered this encounter  Medications   atorvastatin (LIPITOR) 20 MG tablet    Sig: TAKE 1 TABLET BY MOUTH EVERYDAY AT BEDTIME    Dispense:  90 tablet    Refill:  0   aspirin EC 81 MG tablet    Sig: Take 1 tablet (81 mg total) by mouth daily. Swallow whole.    Dispense:  30 tablet    Refill:  12     Current Outpatient Medications:    albuterol (VENTOLIN HFA) 108 (90 Base) MCG/ACT inhaler, INHALE 1 TO 2 PUFFS INTO THE LUNGS EVERY 6 HOURS AS NEEDED FOR WHEEZING OR SHORTNESS OF BREATH, Disp: 54 g, Rfl: 1   aspirin EC 81 MG tablet, Take 1 tablet (81 mg total) by mouth daily. Swallow whole., Disp: 30 tablet, Rfl: 12   atorvastatin (LIPITOR) 20 MG tablet, TAKE 1 TABLET BY MOUTH EVERYDAY AT BEDTIME, Disp: 90 tablet, Rfl: 0   Fluticasone-Umeclidin-Vilant (TRELEGY ELLIPTA) 100-62.5-25 MCG/ACT AEPB, Inhale 1 puff into the lungs daily. Rinse mouth (Patient not taking: Reported on 06/10/2022), Disp: 60 each, Rfl: 12   ipratropium-albuterol (DUONEB)  0.5-2.5 (3) MG/3ML SOLN, Take 3 mLs by nebulization every 6 (  six) hours as needed. (Patient not taking: Reported on 06/10/2022), Disp: 360 mL, Rfl: 12   meloxicam (MOBIC) 15 MG tablet, Take 15 mg by mouth daily. (Patient not taking: Reported on 06/10/2022), Disp: , Rfl:    methocarbamol (ROBAXIN) 500 MG tablet, Take 1 tablet (500 mg total) by mouth every 8 (eight) hours as needed for muscle spasms. (Patient not taking: Reported on 06/10/2022), Disp: 60 tablet, Rfl: 0   metoprolol succinate (TOPROL-XL) 100 MG 24 hr tablet, Take 1 tablet (100 mg total) by mouth daily. Take with or immediately following a meal., Disp: 90 tablet, Rfl: 0  Orders Placed This Encounter  Procedures   TSH   Hemoglobin and hematocrit, blood   Basic metabolic panel   LONG TERM MONITOR (3-14 DAYS)   EKG 12-Lead    There are no Patient Instructions on file for this visit.  --Continue cardiac medications as reconciled in final medication list. --Return in about 6 weeks (around 07/22/2022) for Reevaluation of, Palpitations, Review test results. Or sooner if needed. --Continue follow-up with your primary care physician regarding the management of your other chronic comorbid conditions.  Patient's questions and concerns were addressed to his satisfaction. He voices understanding of the instructions provided during this encounter.   This note was created using a voice recognition software as a result there may be grammatical errors inadvertently enclosed that do not reflect the nature of this encounter. Every attempt is made to correct such errors.  Rex Kras, [place], [facility]  Pager: [phone] Office: [phone]

## 2022-06-11 ENCOUNTER — Encounter (HOSPITAL_COMMUNITY): Payer: Self-pay

## 2022-06-11 ENCOUNTER — Ambulatory Visit (HOSPITAL_COMMUNITY)
Admission: EM | Admit: 2022-06-11 | Discharge: 2022-06-11 | Disposition: A | Discharge status: Home / Self Care | Payer: Medicare HMO | Attending: Internal Medicine | Admitting: Internal Medicine

## 2022-06-11 DIAGNOSIS — H1033 Unspecified acute conjunctivitis, bilateral: Secondary | ICD-10-CM | POA: Diagnosis not present

## 2022-06-11 MED ORDER — ACETAMINOPHEN 500 MG PO TABS: 1000.0000 mg | ORAL_TABLET | Freq: Four times a day (QID) | ORAL | 0 refills | Status: DC | PRN

## 2022-06-11 MED ORDER — ERYTHROMYCIN 5 MG/GM OP OINT: TOPICAL_OINTMENT | OPHTHALMIC | 0 refills | Status: DC

## 2022-06-11 NOTE — ED Provider Notes (Signed)
Joshua Stephenson    CSN: [id number] Arrival date & time: 06/11/22  9169      History   Chief Complaint Chief Complaint  Patient presents with   Eye Problem    HPI Joshua Stephenson is a 66 y.o. male.   Patient presents to urgent care for evaluation of right-sided eye redness and 1 week.  States that he been itching/rubbing his eyes well and this has made discomfort and redness worse.  He does not wear contact lenses and denies known sick contacts with similar symptoms. Reports thick drainage that is worse in the mornings. Blurry vision due to drainage present. Reports mild eye pain, but denies warmth and redness to tissues surrounding the right eye. No URI symptoms, fever/chills, ear pain, sore throat, headache, abdominal pain, or dizziness. He has been using warm compresses at home for symptoms without relief.    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   Hx of adenomatous colonic polyps 07/08/2021   OSA (obstructive sleep apnea) 04/30/2021   Preop pulmonary/respiratory exam 04/30/2021   Snoring 01/14/2021   Dyspnea    Cervical radiculopathy 06/08/2018   Diarrhea 05/31/2018   COPD, 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   Smoker 06/07/2014   GERD (gastroesophageal reflux disease) 06/07/2014   Hemorrhoid 05/27/2014   Precordial pain 05/27/2014    Past Surgical History:  Procedure Laterality Date   ANTERIOR CERVICAL DECOMP/DISCECTOMY FUSION  N/A 08/07/2021   Procedure: Anterior Cervical Decompression/discectomy Fusion Cervical four five, Cervical five six, Cervical six seven;  Surgeon: Consuella Lose, MD;  Location: [facility];  Service: Neurosurgery;  Laterality: N/A;  3C   COLONOSCOPY  2011   SHOULDER ARTHROSCOPY W/ ROTATOR CUFF REPAIR Bilateral 02/08/2018       Home Medications    Prior to Admission medications   Medication Sig Start Date End Date Taking? Authorizing Provider  acetaminophen (TYLENOL) 500 MG tablet Take 2 tablets (1,000 mg total) by mouth every 6 (six) hours as needed. 06/11/22  Yes Talbot Grumbling, FNP  erythromycin ophthalmic ointment Place a 1/2 inch ribbon of ointment into the lower eyelid. 06/11/22  Yes Talbot Grumbling, FNP  albuterol (VENTOLIN HFA) 108 (90 Base) MCG/ACT inhaler INHALE 1 TO 2 PUFFS INTO THE LUNGS EVERY 6 HOURS AS NEEDED FOR WHEEZING OR SHORTNESS OF BREATH 06/01/22   Baird Lyons D, MD  aspirin EC 81 MG tablet Take 1 tablet (81 mg total) by mouth daily. Swallow whole. 06/10/22   Tolia, Sunit, DO  Fluticasone-Umeclidin-Vilant (TRELEGY ELLIPTA) 100-62.5-25 MCG/ACT AEPB Inhale 1 puff into the lungs daily. Rinse mouth Patient not taking: Reported on 06/10/2022 03/02/22   Baird Lyons D, MD  ipratropium-albuterol (DUONEB) 0.5-2.5 (3) MG/3ML SOLN Take 3 mLs by nebulization every 6 (six) hours as needed. Patient not taking: Reported on 06/10/2022 10/14/21   Deneise Lever,  MD  metoprolol succinate (TOPROL-XL) 100 MG 24 hr tablet Take 1 tablet (100 mg total) by mouth daily. Take with or immediately following a meal. 10/28/20 10/01/21  Tolia, Sunit, DO  dicyclomine (BENTYL) 20 MG tablet Take 1 tablet (20 mg total) by mouth 4 (four) times daily -  before meals and at bedtime. As needed for cramps and diarrhea 04/04/18 02/25/20  Gatha Mayer, MD  escitalopram (LEXAPRO) 10 MG tablet Take 1 tablet (10 mg total) by mouth daily. 04/26/18 02/25/20  Nafziger, Tommi Rumps, NP  triazolam (HALCION) 0.25 MG tablet  Take 1 tab by mouth 1 hour prior to procedure with very light food, do not drive motor vehicle. 06/23/18 02/25/20  Magnus Sinning, MD    Family History Family History  Problem Relation Age of Onset   Breast cancer Mother    Hypertension Mother    Diabetes Father    Heart disease Father    Hypertension Father    Cancer Sister        type unknown   Breast cancer Sister    Cancer Brother        oral cancer    Cancer Brother        oral cancer    Breast cancer Niece    Breast cancer Niece    Colon cancer Neg Hx    Esophageal cancer Neg Hx    Rectal cancer Neg Hx    Stomach cancer Neg Hx    Colon polyps Neg Hx     Social History Social History   Tobacco Use   Smoking status: Every Day    Packs/day: 0.25    Years: 20.00    Total pack years: 5.00    Types: Cigarettes   Smokeless tobacco: Never   Tobacco comments:    3 cigs per day 1/11/23lt  Vaping Use   Vaping Use: Never used  Substance Use Topics   Alcohol use: Not Currently   Drug use: No     Allergies   Patient has no known allergies.   Review of Systems Review of Systems Per HPI  Physical Exam Triage Vital Signs ED Triage Vitals  Enc Vitals Group     BP 06/11/22 0943 (!) 142/92     Pulse Rate 06/11/22 0943 65     Resp 06/11/22 0943 16     Temp 06/11/22 0943 98 F (36.7 C)     Temp Source 06/11/22 0943 Oral     SpO2 06/11/22 0943 98 %     Weight --      Height --      Head Circumference --      Peak Flow --      Pain Score 06/11/22 0941 5     Pain Loc --      Pain Edu? --      Excl. in [place]? --    No data found.  Updated Vital Signs BP (!) 142/92 (BP Location: Right Arm)   Pulse 65   Temp 98 F (36.7 C) (Oral)   Resp 16   SpO2 98%   Visual Acuity Right Eye Distance: 20/70 Left Eye Distance: 20/50 Bilateral Distance:    Right Eye Near:   Left Eye Near:    Bilateral Near:     Physical Exam Vitals and nursing note reviewed.  Constitutional:      Appearance: Normal appearance. He  is not ill-appearing or toxic-appearing.     Comments: Very pleasant patient sitting on exam in position of comfort  table in no acute distress.   HENT:     Head: Normocephalic and atraumatic.     Right Ear: Hearing and external ear normal.     Left Ear: Hearing and external ear normal.     Nose: Nose normal.     Mouth/Throat:     Lips: Pink.     Mouth: Mucous membranes are moist.  Eyes:     General: Lids are normal. Vision grossly intact. Gaze aligned appropriately. No visual field deficit or scleral icterus.       Right eye: Discharge present. No hordeolum.     Extraocular Movements: Extraocular movements intact.     Conjunctiva/sclera:     Right eye: Right conjunctiva is injected. No chemosis.    Left eye: Left conjunctiva is injected. No chemosis.    Comments: EOMS intact without pain or dizziness elicited. No signs of preseptal cellulitis. Vision is grossly intact, although there is small amount of white/clear discharge from the right eye.   Pulmonary:     Effort: Pulmonary effort is normal.  Abdominal:     Palpations: Abdomen is soft.  Musculoskeletal:     Cervical back: Neck supple.  Skin:    General: Skin is warm and dry.     Capillary Refill: Capillary refill takes less than 2 seconds.     Findings: No rash.  Neurological:     General: No focal deficit present.     Mental Status: He is alert and oriented to person, place, and time. Mental status is at baseline.     Cranial Nerves: No dysarthria or facial asymmetry.     Gait: Gait is intact.  Psychiatric:        Mood and Affect: Mood normal.        Speech: Speech normal.        Behavior: Behavior normal.        Thought Content: Thought content normal.        Judgment: Judgment normal.      UC Treatments / Results  Labs (all labs ordered are listed, but only abnormal results are displayed) Labs Reviewed - No data to display  EKG   Radiology No results found.  Procedures Procedures (including critical care  time)  Medications Ordered in UC Medications - No data to display  Initial Impression / Assessment and Plan / UC Course  I have reviewed the triage vital signs and the nursing notes.  Pertinent labs & imaging results that were available during my care of the patient were reviewed by me and considered in my medical decision making (see chart for details).   1. Acute bacterial conjunctivitis Advised to apply erythromycin eye ointment to both eyes as directed twice daily for the next 7 days to treat infected eyes. Change pillowcase after a few days to prevent reinfection. Tylenol may be used every 6 hours as needed for pain associated with pink eye.Warm compresses prior to erythromycin eye ointment administration advised. Advised to avoid scratching eye as this can make symptoms worse. No indication for immediate referral to ED for further evaluation due to stable exam findings and hemodynamically stable vital signs.   Discussed physical exam and available lab work findings in clinic with patient.  Counseled patient regarding appropriate use of medications and potential side effects for all medications recommended or prescribed today. Discussed red flag signs and symptoms of worsening condition,when to call the PCP office, return to urgent care, and when to seek higher level of care in the emergency  department. Patient verbalizes understanding and agreement with plan. All questions answered. Patient discharged in stable condition.  Final Clinical Impressions(s) / UC Diagnoses   Final diagnoses:  Acute bacterial conjunctivitis of both eyes     Discharge Instructions      You have pinkeye to both eyes and you likely scratched your right eye from itching.  P Place a thin ribbon of erythromycin eye ointment to the lower lid of both of your eyes twice daily for the next 7 days to treat infection.  Change your pillowcase after 2 to 3 days to avoid reinfection.   You may take Tylenol every 6 hours  as needed for any pain you may have.   Avoid scratching your eye.  Wash your hands frequently to avoid spread of infection to others.  Perform warm compresses to your eye before applying the eyedrops.   If you develop any new or worsening symptoms or do not improve in the next 2 to 3 days, please return.  If your symptoms are severe, please go to the emergency room.  Follow-up with your primary care provider for further evaluation and management of your symptoms as well as ongoing wellness visits.  I hope you feel better!   ED Prescriptions     Medication Sig Dispense Auth. Provider   erythromycin ophthalmic ointment Place a 1/2 inch ribbon of ointment into the lower eyelid. 3.5 g Joella Prince M, FNP   acetaminophen (TYLENOL) 500 MG tablet Take 2 tablets (1,000 mg total) by mouth every 6 (six) hours as needed. 30 tablet Talbot Grumbling, FNP      PDMP not reviewed this encounter.   Talbot Grumbling, [place] 06/13/22 1441

## 2022-06-11 NOTE — ED Triage Notes (Signed)
Patient with c/o right eye irritation. States it is itchy and draining x1 week. When he wakes up in the mornings it is swollen shut.

## 2022-06-11 NOTE — Discharge Instructions (Addendum)
You have pinkeye to both eyes and you likely scratched your right eye from itching.  P Place a thin ribbon of erythromycin eye ointment to the lower lid of both of your eyes twice daily for the next 7 days to treat infection.  Change your pillowcase after 2 to 3 days to avoid reinfection.   You may take Tylenol every 6 hours as needed for any pain you may have.   Avoid scratching your eye.  Wash your hands frequently to avoid spread of infection to others.  Perform warm compresses to your eye before applying the eyedrops.   If you develop any new or worsening symptoms or do not improve in the next 2 to 3 days, please return.  If your symptoms are severe, please go to the emergency room.  Follow-up with your primary care provider for further evaluation and management of your symptoms as well as ongoing wellness visits.  I hope you feel better!

## 2022-06-19 ENCOUNTER — Other Ambulatory Visit: Payer: Medicare HMO

## 2022-06-24 DIAGNOSIS — R002 Palpitations: Secondary | ICD-10-CM | POA: Diagnosis not present

## 2022-06-25 LAB — BASIC METABOLIC PANEL
BUN/Creatinine Ratio: 12 (ref 10–24)
BUN: 14 mg/dL (ref 8–27)
CO2: 22 mmol/L (ref 20–29)
Calcium: 10.4 mg/dL — ABNORMAL HIGH (ref 8.6–10.2)
Chloride: 99 mmol/L (ref 96–106)
Creatinine, Ser: 1.13 mg/dL (ref 0.76–1.27)
Glucose: 102 mg/dL — ABNORMAL HIGH (ref 70–99)
Potassium: 4.3 mmol/L (ref 3.5–5.2)
Sodium: 138 mmol/L (ref 134–144)
eGFR: 73 mL/min/{1.73_m2} (ref >59)

## 2022-06-25 LAB — HEMOGLOBIN AND HEMATOCRIT, BLOOD
Hematocrit: 50.3 % (ref 37.5–51.0)
Hemoglobin: 17.4 g/dL (ref 13.0–17.7)

## 2022-06-25 LAB — TSH: TSH: 1.38 u[IU]/mL (ref 0.450–4.500)

## 2022-07-01 DIAGNOSIS — R002 Palpitations: Secondary | ICD-10-CM | POA: Diagnosis not present

## 2022-07-03 NOTE — Progress Notes (Signed)
Patient called back and was informed his result she voiced understanding

## 2022-07-03 NOTE — Progress Notes (Signed)
Called patient, NA, LMAM

## 2022-07-09 ENCOUNTER — Ambulatory Visit
Admission: RE | Admit: 2022-07-09 | Discharge: 2022-07-09 | Disposition: A | Discharge status: Home / Self Care | Payer: Medicare HMO | Source: Ambulatory Visit | Attending: Acute Care | Admitting: Acute Care

## 2022-07-09 DIAGNOSIS — F17210 Nicotine dependence, cigarettes, uncomplicated: Secondary | ICD-10-CM

## 2022-07-09 DIAGNOSIS — Z87891 Personal history of nicotine dependence: Secondary | ICD-10-CM

## 2022-07-09 DIAGNOSIS — M47814 Spondylosis without myelopathy or radiculopathy, thoracic region: Secondary | ICD-10-CM | POA: Diagnosis not present

## 2022-07-09 DIAGNOSIS — J432 Centrilobular emphysema: Secondary | ICD-10-CM | POA: Diagnosis not present

## 2022-07-09 DIAGNOSIS — I2510 Atherosclerotic heart disease of native coronary artery without angina pectoris: Secondary | ICD-10-CM | POA: Diagnosis not present

## 2022-07-14 ENCOUNTER — Other Ambulatory Visit: Payer: Self-pay | Admitting: Acute Care

## 2022-07-14 DIAGNOSIS — Z122 Encounter for screening for malignant neoplasm of respiratory organs: Secondary | ICD-10-CM

## 2022-07-14 DIAGNOSIS — F17210 Nicotine dependence, cigarettes, uncomplicated: Secondary | ICD-10-CM

## 2022-07-14 DIAGNOSIS — Z87891 Personal history of nicotine dependence: Secondary | ICD-10-CM

## 2022-07-20 DIAGNOSIS — R002 Palpitations: Secondary | ICD-10-CM | POA: Diagnosis not present

## 2022-07-24 ENCOUNTER — Ambulatory Visit: Payer: Medicare HMO | Admitting: Cardiology

## 2022-07-24 ENCOUNTER — Encounter: Payer: Self-pay | Admitting: Cardiology

## 2022-07-24 VITALS — BP 140/58 | HR 80 | Temp 98.4°F | Resp 15 | Ht 67.0 in | Wt 179.8 lb

## 2022-07-24 DIAGNOSIS — R002 Palpitations: Secondary | ICD-10-CM

## 2022-07-24 DIAGNOSIS — I10 Essential (primary) hypertension: Secondary | ICD-10-CM | POA: Diagnosis not present

## 2022-07-24 DIAGNOSIS — I2510 Atherosclerotic heart disease of native coronary artery without angina pectoris: Secondary | ICD-10-CM | POA: Diagnosis not present

## 2022-07-24 DIAGNOSIS — G4730 Sleep apnea, unspecified: Secondary | ICD-10-CM

## 2022-07-24 DIAGNOSIS — Z8249 Family history of ischemic heart disease and other diseases of the circulatory system: Secondary | ICD-10-CM

## 2022-07-24 DIAGNOSIS — I700 Atherosclerosis of aorta: Secondary | ICD-10-CM | POA: Diagnosis not present

## 2022-07-24 DIAGNOSIS — Z87891 Personal history of nicotine dependence: Secondary | ICD-10-CM | POA: Diagnosis not present

## 2022-07-24 NOTE — Progress Notes (Signed)
Called patient, NA, LMAM

## 2022-07-24 NOTE — Progress Notes (Signed)
Called and spoke with patient he will be here for his scheduled appointment.

## 2022-07-24 NOTE — Progress Notes (Signed)
ID:  Joshua Stephenson, DOB 08/15/1956, MRN [id number]  PCP:  Sueanne Margarita, DO  Cardiologist:  Rex Kras, DO, [facility] (established care October 01, 2020)  Date: 07/24/22 Last Office Visit: 06/10/2022  Chief Complaint  Patient presents with   Follow-up    Reevaluation of palpitations and discuss test results    HPI  Joshua Stephenson is a 66 y.o. male whose past medical history and cardiovascular risk factors include: Benign essential hypertension, sleep apnea, former smoker (69-pack-years hx of smoking), COPD, family history of premature CAD, aortic atherosclerosis, mild coronary artery calcification and minimal nonobstructive CAD.  Initially referred to the practice for evaluation of chest pain.  He did undergo ischemic work-up in the past as noted below.  He presented to the office in September 2023 for annual visit but had concerns for palpitations as originally described.  This shared decision was to proceed with additional blood work and Zio patch to evaluate for dysrhythmias.  Patient's electrolytes, hemoglobin, and TSH are within acceptable limits.  Zio patch illustrated underlying rhythm to be sinus, average heart rate 75 bpm with a tachycardia burden of 10%.  While he was wearing the monitor he was not on metoprolol.  He had one auto triggered event of second-degree type I AV block at 1:17 AM.  Patient states that he does have sleep apnea but is not compliant with the CPAP.  Clinically denies anginal discomfort or heart failure symptoms.  Palpitations have improved significantly.  He used to drink at least 6 bottles of soda on a daily basis and now down to 1 bottle per day.  Patient does not recall his medications at today's office visit.  But states that he is likely on Toprol-XL 50 mg p.o. daily  FUNCTIONAL STATUS: No structured exercise program or daily routine.  ALLERGIES: No Known Allergies  MEDICATION LIST PRIOR TO VISIT: Current Meds  Medication Sig    acetaminophen (TYLENOL) 500 MG tablet Take 2 tablets (1,000 mg total) by mouth every 6 (six) hours as needed.   albuterol (VENTOLIN HFA) 108 (90 Base) MCG/ACT inhaler INHALE 1 TO 2 PUFFS INTO THE LUNGS EVERY 6 HOURS AS NEEDED FOR WHEEZING OR SHORTNESS OF BREATH   aspirin EC 81 MG tablet Take 1 tablet (81 mg total) by mouth daily. Swallow whole.   metoprolol succinate (TOPROL-XL) 50 MG 24 hr tablet Take 50 mg by mouth daily. Take with or immediately following a meal.     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   Coronary artery calcification    Depression    Dyspnea    GERD (gastroesophageal reflux disease)    Hemorrhoids    Hx of adenomatous colonic polyps 07/08/2021   2 diminutive - repeat exam 2029   Hypertension    Sleep apnea    in process of obtaining cpap    PAST SURGICAL HISTORY: Past Surgical History:  Procedure Laterality Date   ANTERIOR CERVICAL DECOMP/DISCECTOMY FUSION N/A 08/07/2021   Procedure: Anterior Cervical Decompression/discectomy Fusion Cervical four five, Cervical five six, Cervical six seven;  Surgeon: Consuella Lose, MD;  Location: [facility];  Service: Neurosurgery;  Laterality: N/A;  3C   COLONOSCOPY  2011   SHOULDER ARTHROSCOPY W/ ROTATOR CUFF REPAIR Bilateral 02/08/2018    FAMILY HISTORY: The patient family history includes Breast cancer in his mother, niece, niece,  and sister; Cancer in his brother, brother, and sister; Diabetes in his father; Heart disease in his father; Hypertension in his father and mother.  SOCIAL HISTORY:  The patient  reports that he has been smoking cigarettes. He has a 5.00 pack-year smoking history. He has never used smokeless tobacco. He reports that he does not currently use alcohol. He reports that he does not use drugs.  REVIEW OF SYSTEMS: Review of Systems  Cardiovascular:  Negative for chest pain,  claudication, dyspnea on exertion, irregular heartbeat, leg swelling, near-syncope, orthopnea, palpitations, paroxysmal nocturnal dyspnea and syncope.  Respiratory:  Negative for shortness of breath.   Hematologic/Lymphatic: Negative for bleeding problem.  Musculoskeletal:  Negative for muscle cramps and myalgias.  Neurological:  Negative for dizziness and light-headedness.   PHYSICAL EXAM:    07/24/2022    2:49 PM 06/11/2022    9:43 AM 06/10/2022    9:43 AM  Vitals with BMI  Height _0   _1   Weight 179 lbs 13 oz  180 lbs  BMI 19.75  88.32  Systolic 549 826 415  Diastolic 58 92 70  Pulse 80 65 72   Physical Exam  Constitutional: No distress.  Age appropriate, hemodynamically stable.   Neck: No JVD present.  Cardiovascular: Normal rate, regular rhythm, S1 normal, S2 normal, intact distal pulses and normal pulses. Exam reveals no gallop, no S3 and no S4.  No murmur heard. Pulmonary/Chest: Effort normal and breath sounds normal. No stridor. He has no wheezes. He has no rales.  Abdominal: Soft. Bowel sounds are normal. He exhibits no distension. There is no abdominal tenderness.  Musculoskeletal:        General: No edema.     Cervical back: Neck supple.  Neurological: He is alert and oriented to person, place, and time. He has intact cranial nerves (2-12).  Skin: Skin is warm and moist.   CARDIAC DATABASE: EKG: 06/10/2022: Normal sinus rhythm, 73 bpm, without underlying ischemia or injury pattern.  Echocardiogram: 10/10/2020: Normal LV systolic function with visual EF 55-60%. Left ventricle cavity is normal in size. Moderate left ventricular hypertrophy. Normal global wall motion. No significant valvular heart disease. No prior study for comparison   Stress Testing: No results found for this or any previous visit from the past 1095 days.  CCTA:  10/23/2020:  Total calcium score: 19 AU, which places the patient in the 63th percentile for age and sex matched  control. Normal coronary origin with right dominance. CAD-RADS = 1. Minimal stenosis in distal LAD, Minimal stenosis in ostial LCX. Aortic atherosclerosis.   Heart Catheterization: None  Cardiac monitor (Zio Patch): Dominant rhythm sinus, followed by tachycardia (10% burden). Heart rate 31-149 bpm.  Avg HR 75 bpm. No atrial fibrillation,  ventricular tachycardia, high grade AV block, pauses (3 seconds or longer). Total ventricular ectopic burden 1.1%. Total supraventricular ectopic burden <1%. Auto triggered event: 1 episode of sinus rhythm with second-degree type I AV block at 1:17 AM and 06/16/2022 Patient triggered events: 3.  Underlying rhythm sinus without dysrhythmia.  LABORATORY DATA:    Latest Ref Rng & Units 06/24/2022   11:35 AM 07/30/2021    9:21 AM 04/04/2018   10:24 AM  CBC  WBC 4.0 - 10.5 K/uL  5.6  6.4   Hemoglobin 13.0 - 17.7 g/dL 17.4  15.3  14.2   Hematocrit 37.5 - 51.0 % 50.3  44.3  40.5   Platelets 150.0 - 400.0 K/uL  220.0  279.0  Latest Ref Rng & Units 06/24/2022   11:35 AM 07/31/2021   12:00 PM 11/05/2020   11:47 AM  CMP  Glucose 70 - 99 mg/dL 102  98  78   BUN 8 - 27 mg/dL _0 Creatinine 0.76 - 1.27 mg/dL 1.13  1.14  1.25   Sodium 134 - 144 mmol/L 138  138  140   Potassium 3.5 - 5.2 mmol/L 4.3  3.8  4.0   Chloride 96 - 106 mmol/L 99  105  103   CO2 20 - 29 mmol/L _1 Calcium 8.6 - 10.2 mg/dL 10.4  9.1  9.9     Lipid Panel     Component Value Date/Time   CHOL 162 06/16/2017 1717   TRIG 128.0 06/16/2017 1717   HDL 47.90 06/16/2017 1717   CHOLHDL 3 06/16/2017 1717   VLDL 25.6 06/16/2017 1717   LDLCALC 89 06/16/2017 1717    No components found for: "NTPROBNP" No results for input(s): "PROBNP" in the last 8760 hours. Recent Labs    06/24/22 1135  TSH 1.380    BMP Recent Labs    07/31/21 1200 06/24/22 1135  NA 138 138  K 3.8 4.3  CL 105 99  CO2 27 22  GLUCOSE 98 102*  BUN 10 14  CREATININE 1.14 1.13   CALCIUM 9.1 10.4*  GFRNONAA >60  --     HEMOGLOBIN A1C Lab Results  Component Value Date   HGBA1C 5.3 06/16/2017   External Labs: Collected: 09/12/2020 Creatinine 1.2 mg/dL. eGFR: 74 mL/min per 1.73 m Sodium 140, potassium 4.1, chloride 105, bicarb 23, AST 17, ALT 18, alkaline phosphatase 91 Hemoglobin 17 g/dL, hematocrit 47.6% Lipid profile: Total cholesterol 149, triglycerides 81, HDL 39, LDL 94 TSH: 1.02  IMPRESSION:    ICD-10-CM   1. Palpitations  R00.2     2. Coronary atherosclerosis due to calcified coronary lesion  I25.10    I25.84     3. Atherosclerosis of aorta (HCC)  I70.0     4. Benign hypertension  I10     5. Sleep apnea in adult  G47.30     6. Former smoker  Z87.891     69. Family history of premature CAD  Z82.49        RECOMMENDATIONS: Joshua Stephenson is a 66 y.o. male whose past medical history and cardiac risk factors include: Benign essential hypertension, former smoker (69-pack-years hx of smoking), COPD, family history of premature CAD, aortic atherosclerosis, mild coronary artery calcification.  Palpitations Improved. Likely secondary to increased caffeine and sugar intake. He has significantly reduced consumption of soda. Zio patch results reviewed. No significant dysrhythmia with the exception of the tachycardia burden of 10%.  And had 1 asymptomatic episode of second-degree type I AV block while asleep.  Patient is encouraged to use his CPAP on a regular basis and start metoprolol. Patient is unaware of the current dose of metoprolol -we will call the office back and reconcile his medications more accurately. TSH and BMP within acceptable limits. Denies near-syncope or syncope. Monitor for now  Coronary atherosclerosis due to calcified coronary lesion Total CAC 19. Minimal nonobstructive CAD based on his prior coronary CTA. Denies angina pectoris. EKG: Nonischemic. We will represcribe aspirin and cholesterol medications as he  stopped all meds a year ago.   Benign hypertension Office blood pressures are not well controlled. He does not check his blood pressures at home. Patient is asked to  check his blood pressures at home to reach out to PCP if SBP is greater than 130 mmHg. Reemphasized importance of low-salt diet.  FINAL MEDICATION LIST END OF ENCOUNTER: No orders of the defined types were placed in this encounter.    Current Outpatient Medications:    acetaminophen (TYLENOL) 500 MG tablet, Take 2 tablets (1,000 mg total) by mouth every 6 (six) hours as needed., Disp: 30 tablet, Rfl: 0   albuterol (VENTOLIN HFA) 108 (90 Base) MCG/ACT inhaler, INHALE 1 TO 2 PUFFS INTO THE LUNGS EVERY 6 HOURS AS NEEDED FOR WHEEZING OR SHORTNESS OF BREATH, Disp: 54 g, Rfl: 1   aspirin EC 81 MG tablet, Take 1 tablet (81 mg total) by mouth daily. Swallow whole., Disp: 30 tablet, Rfl: 12   metoprolol succinate (TOPROL-XL) 50 MG 24 hr tablet, Take 50 mg by mouth daily. Take with or immediately following a meal., Disp: , Rfl:   No orders of the defined types were placed in this encounter.   There are no Patient Instructions on file for this visit.  --Continue cardiac medications as reconciled in final medication list. --Return in about 1 year (around 07/25/2023) for Annual follow up visit. Or sooner if needed. --Continue follow-up with your primary care physician regarding the management of your other chronic comorbid conditions.  Patient's questions and concerns were addressed to his satisfaction. He voices understanding of the instructions provided during this encounter.   This note was created using a voice recognition software as a result there may be grammatical errors inadvertently enclosed that do not reflect the nature of this encounter. Every attempt is made to correct such errors.  Rex Kras, [place], [facility]  Pager: [phone] Office: [phone]

## 2022-08-27 ENCOUNTER — Ambulatory Visit (HOSPITAL_BASED_OUTPATIENT_CLINIC_OR_DEPARTMENT_OTHER)
Admission: RE | Admit: 2022-08-27 | Discharge: 2022-08-27 | Disposition: A | Discharge status: Home / Self Care | Payer: Medicare HMO | Source: Ambulatory Visit | Attending: Internal Medicine | Admitting: Internal Medicine

## 2022-08-27 DIAGNOSIS — J9811 Atelectasis: Secondary | ICD-10-CM | POA: Diagnosis not present

## 2022-08-27 DIAGNOSIS — I700 Atherosclerosis of aorta: Secondary | ICD-10-CM | POA: Insufficient documentation

## 2022-08-27 DIAGNOSIS — J439 Emphysema, unspecified: Secondary | ICD-10-CM | POA: Insufficient documentation

## 2022-08-27 DIAGNOSIS — R911 Solitary pulmonary nodule: Secondary | ICD-10-CM | POA: Diagnosis not present

## 2022-08-28 ENCOUNTER — Telehealth: Payer: Self-pay | Admitting: Internal Medicine

## 2022-08-28 DIAGNOSIS — R198 Other specified symptoms and signs involving the digestive system and abdomen: Secondary | ICD-10-CM

## 2022-08-28 NOTE — Telephone Encounter (Signed)
Joshua Lever, MD 08/28/2022  3:37 PM EST     1)-CT chest- stable nodule in upper right needs continued watching. Order- future CT chest no contrast,    dx Lung nodule   2)- multiple abnormal lymph nodes in abdomen. Order- CT abdomen with and without contrast   dx multiple abdominal lymph nodes    Rosana Berger, CMA 08/28/2022  3:45 PM EST     I spoke with the pt and notified of results and recommendations from Dr Annamaria Boots. He verbalized understanding. He is okay with future chest ct and abd scan with and without to be done now. I have ordered the abd scan.  Dr Annamaria Boots- please advise on when you want the chest CT to be repeated so we can go ahead and place order for that, thanks!

## 2022-08-28 NOTE — Telephone Encounter (Signed)
[facility] Imaging and received the following information:  IMPRESSION: 1. Spiculated RIGHT upper lobe pulmonary nodule 7 x 5 mm unchanged dating back to September of 2022. Consider follow-up at 18-24 months from the scan of September of 2022 to ensure stability for this duration. Findings favor benign nodule. 2. Increasing number and size of lymph nodes in the upper abdomen along with question of collateral pathways in the upper abdomen. Findings are nonspecific. Dedicated abdominal imaging is suggested for further evaluation to exclude neoplasm in the upper abdomen. 3. Alternatively, there could be developing liver disease based on the appearance of the liver but there is no current evidence of splenomegaly. Again findings are nonspecific and warrant further evaluation. 4. Signs of paraseptal and centrilobular emphysema. 5. Material in the RIGHT lower lobe bronchi in the dependent bronchi. Mild bronchial wall thickening to a lesser extent on the LEFT to LEFT lower lobe. Findings raise the question of chronic aspiration or inspissated secretions.  Report sent to Dr. Annamaria Boots.  Dr. Annamaria Boots, please advise.  Thank you.

## 2022-08-28 NOTE — Progress Notes (Signed)
I spoke with the pt and notified of results and recommendations from Dr Annamaria Boots. He verbalized understanding. He is okay with future chest ct and abd scan with and without to be done now. I have ordered the abd scan.  Dr Annamaria Boots- please advise on when you want the chest CT to be repeated so we can go ahead and place order for that, thanks!

## 2022-08-31 ENCOUNTER — Other Ambulatory Visit: Payer: Self-pay

## 2022-08-31 DIAGNOSIS — R911 Solitary pulmonary nodule: Secondary | ICD-10-CM

## 2022-09-01 ENCOUNTER — Other Ambulatory Visit (INDEPENDENT_AMBULATORY_CARE_PROVIDER_SITE_OTHER): Payer: Medicare HMO

## 2022-09-01 ENCOUNTER — Other Ambulatory Visit: Payer: Medicare HMO

## 2022-09-01 ENCOUNTER — Other Ambulatory Visit: Payer: Self-pay

## 2022-09-01 DIAGNOSIS — R911 Solitary pulmonary nodule: Secondary | ICD-10-CM

## 2022-09-01 LAB — BASIC METABOLIC PANEL
BUN: 16 mg/dL (ref 6–23)
CO2: 24 mEq/L (ref 19–32)
Calcium: 9.4 mg/dL (ref 8.4–10.5)
Chloride: 107 mEq/L (ref 96–112)
Creatinine, Ser: 1.1 mg/dL (ref 0.40–1.50)
GFR: 71.15 mL/min (ref >60.00)
Glucose, Bld: 94 mg/dL (ref 70–99)
Potassium: 4 mEq/L (ref 3.5–5.1)
Sodium: 139 mEq/L (ref 135–145)

## 2022-09-02 ENCOUNTER — Other Ambulatory Visit: Payer: Self-pay | Admitting: Internal Medicine

## 2022-09-02 DIAGNOSIS — R911 Solitary pulmonary nodule: Secondary | ICD-10-CM

## 2022-09-04 ENCOUNTER — Other Ambulatory Visit: Payer: Self-pay | Admitting: Cardiology

## 2022-09-04 DIAGNOSIS — I2510 Atherosclerotic heart disease of native coronary artery without angina pectoris: Secondary | ICD-10-CM

## 2022-09-04 NOTE — Telephone Encounter (Signed)
Should I fill this?

## 2022-09-08 ENCOUNTER — Ambulatory Visit (HOSPITAL_BASED_OUTPATIENT_CLINIC_OR_DEPARTMENT_OTHER)
Admission: RE | Admit: 2022-09-08 | Discharge: 2022-09-08 | Disposition: A | Discharge status: Home / Self Care | Payer: Medicare HMO | Source: Ambulatory Visit | Attending: Internal Medicine | Admitting: Internal Medicine

## 2022-09-08 DIAGNOSIS — R198 Other specified symptoms and signs involving the digestive system and abdomen: Secondary | ICD-10-CM | POA: Insufficient documentation

## 2022-09-08 DIAGNOSIS — R590 Localized enlarged lymph nodes: Secondary | ICD-10-CM | POA: Diagnosis not present

## 2022-09-08 DIAGNOSIS — K3189 Other diseases of stomach and duodenum: Secondary | ICD-10-CM | POA: Diagnosis not present

## 2022-09-08 DIAGNOSIS — I700 Atherosclerosis of aorta: Secondary | ICD-10-CM | POA: Diagnosis not present

## 2022-09-08 MED ORDER — IOHEXOL 300 MG/ML  SOLN
100.0000 mL | Freq: Once | INTRAMUSCULAR | Status: AC | PRN
  Administered 2022-09-08: 100 mL via INTRAVENOUS

## 2022-09-09 NOTE — Telephone Encounter (Signed)
Reviewed patient's chart, CT chest has been ordered. Will close this encounter.

## 2022-12-03 ENCOUNTER — Other Ambulatory Visit: Payer: Self-pay | Admitting: Internal Medicine

## 2022-12-08 DIAGNOSIS — S39012A Strain of muscle, fascia and tendon of lower back, initial encounter: Secondary | ICD-10-CM | POA: Diagnosis not present

## 2022-12-08 DIAGNOSIS — M5416 Radiculopathy, lumbar region: Secondary | ICD-10-CM | POA: Diagnosis not present

## 2022-12-08 DIAGNOSIS — R030 Elevated blood-pressure reading, without diagnosis of hypertension: Secondary | ICD-10-CM | POA: Diagnosis not present

## 2022-12-08 DIAGNOSIS — M62830 Muscle spasm of back: Secondary | ICD-10-CM | POA: Diagnosis not present

## 2022-12-15 DIAGNOSIS — M25551 Pain in right hip: Secondary | ICD-10-CM | POA: Diagnosis not present

## 2022-12-15 DIAGNOSIS — M5416 Radiculopathy, lumbar region: Secondary | ICD-10-CM | POA: Diagnosis not present

## 2022-12-15 DIAGNOSIS — G9589 Other specified diseases of spinal cord: Secondary | ICD-10-CM | POA: Diagnosis not present

## 2022-12-16 ENCOUNTER — Other Ambulatory Visit: Payer: Self-pay | Admitting: Family Medicine

## 2022-12-16 DIAGNOSIS — M5416 Radiculopathy, lumbar region: Secondary | ICD-10-CM

## 2023-01-04 ENCOUNTER — Ambulatory Visit
Admission: RE | Admit: 2023-01-04 | Discharge: 2023-01-04 | Disposition: A | Discharge status: Home / Self Care | Payer: Medicare HMO | Source: Ambulatory Visit | Attending: Family Medicine | Admitting: Family Medicine

## 2023-01-04 DIAGNOSIS — M5416 Radiculopathy, lumbar region: Secondary | ICD-10-CM

## 2023-01-04 DIAGNOSIS — M48061 Spinal stenosis, lumbar region without neurogenic claudication: Secondary | ICD-10-CM | POA: Diagnosis not present

## 2023-01-04 DIAGNOSIS — M5136 Other intervertebral disc degeneration, lumbar region: Secondary | ICD-10-CM | POA: Diagnosis not present

## 2023-01-04 DIAGNOSIS — M545 Low back pain, unspecified: Secondary | ICD-10-CM | POA: Diagnosis not present

## 2023-01-12 DIAGNOSIS — Z125 Encounter for screening for malignant neoplasm of prostate: Secondary | ICD-10-CM | POA: Diagnosis not present

## 2023-01-12 DIAGNOSIS — R7303 Prediabetes: Secondary | ICD-10-CM | POA: Diagnosis not present

## 2023-01-12 DIAGNOSIS — I10 Essential (primary) hypertension: Secondary | ICD-10-CM | POA: Diagnosis not present

## 2023-01-12 DIAGNOSIS — Z0000 Encounter for general adult medical examination without abnormal findings: Secondary | ICD-10-CM | POA: Diagnosis not present

## 2023-01-12 DIAGNOSIS — R7989 Other specified abnormal findings of blood chemistry: Secondary | ICD-10-CM | POA: Diagnosis not present

## 2023-01-19 DIAGNOSIS — Z1339 Encounter for screening examination for other mental health and behavioral disorders: Secondary | ICD-10-CM | POA: Diagnosis not present

## 2023-01-19 DIAGNOSIS — Z720 Tobacco use: Secondary | ICD-10-CM | POA: Diagnosis not present

## 2023-01-19 DIAGNOSIS — Z0000 Encounter for general adult medical examination without abnormal findings: Secondary | ICD-10-CM | POA: Diagnosis not present

## 2023-01-19 DIAGNOSIS — I25118 Atherosclerotic heart disease of native coronary artery with other forms of angina pectoris: Secondary | ICD-10-CM | POA: Diagnosis not present

## 2023-01-19 DIAGNOSIS — Z1331 Encounter for screening for depression: Secondary | ICD-10-CM | POA: Diagnosis not present

## 2023-01-19 DIAGNOSIS — F331 Major depressive disorder, recurrent, moderate: Secondary | ICD-10-CM | POA: Diagnosis not present

## 2023-01-19 DIAGNOSIS — R82998 Other abnormal findings in urine: Secondary | ICD-10-CM | POA: Diagnosis not present

## 2023-01-19 DIAGNOSIS — I10 Essential (primary) hypertension: Secondary | ICD-10-CM | POA: Diagnosis not present

## 2023-01-19 DIAGNOSIS — I700 Atherosclerosis of aorta: Secondary | ICD-10-CM | POA: Diagnosis not present

## 2023-01-19 DIAGNOSIS — J449 Chronic obstructive pulmonary disease, unspecified: Secondary | ICD-10-CM | POA: Diagnosis not present

## 2023-02-25 DIAGNOSIS — E119 Type 2 diabetes mellitus without complications: Secondary | ICD-10-CM | POA: Diagnosis not present

## 2023-03-04 ENCOUNTER — Telehealth: Payer: Self-pay | Admitting: Internal Medicine

## 2023-03-04 MED ORDER — TRELEGY ELLIPTA 100-62.5-25 MCG/ACT IN AEPB: 1.0000 | INHALATION_SPRAY | Freq: Every day | RESPIRATORY_TRACT | 1 refills | Status: DC

## 2023-03-04 NOTE — Telephone Encounter (Signed)
Patient requested a refill of Trelegy sent to [facility] [address] [place]. Patient has been out for 2 days. Patient is wheezing- made an appointment for 7/10.

## 2023-03-04 NOTE — Telephone Encounter (Signed)
Called and spoke w/ pt refills have been sent for his trelegy to preferred pharmacy [facility].

## 2023-03-23 ENCOUNTER — Other Ambulatory Visit: Payer: Medicare HMO

## 2023-03-31 ENCOUNTER — Ambulatory Visit (INDEPENDENT_AMBULATORY_CARE_PROVIDER_SITE_OTHER): Payer: Medicare HMO

## 2023-03-31 ENCOUNTER — Encounter: Payer: Self-pay | Admitting: Primary Care

## 2023-03-31 ENCOUNTER — Ambulatory Visit (INDEPENDENT_AMBULATORY_CARE_PROVIDER_SITE_OTHER): Payer: Medicare HMO | Admitting: Primary Care

## 2023-03-31 VITALS — BP 144/80 | HR 85 | Temp 98.9°F | Ht 67.0 in | Wt 172.8 lb

## 2023-03-31 DIAGNOSIS — J449 Chronic obstructive pulmonary disease, unspecified: Secondary | ICD-10-CM

## 2023-03-31 DIAGNOSIS — G4733 Obstructive sleep apnea (adult) (pediatric): Secondary | ICD-10-CM | POA: Diagnosis not present

## 2023-03-31 DIAGNOSIS — J439 Emphysema, unspecified: Secondary | ICD-10-CM | POA: Diagnosis not present

## 2023-03-31 DIAGNOSIS — R911 Solitary pulmonary nodule: Secondary | ICD-10-CM | POA: Diagnosis not present

## 2023-03-31 DIAGNOSIS — Z981 Arthrodesis status: Secondary | ICD-10-CM | POA: Diagnosis not present

## 2023-03-31 DIAGNOSIS — R918 Other nonspecific abnormal finding of lung field: Secondary | ICD-10-CM | POA: Diagnosis not present

## 2023-03-31 DIAGNOSIS — F419 Anxiety disorder, unspecified: Secondary | ICD-10-CM | POA: Diagnosis not present

## 2023-03-31 DIAGNOSIS — R0989 Other specified symptoms and signs involving the circulatory and respiratory systems: Secondary | ICD-10-CM | POA: Diagnosis not present

## 2023-03-31 DIAGNOSIS — J4489 Other specified chronic obstructive pulmonary disease: Secondary | ICD-10-CM

## 2023-03-31 MED ORDER — PREDNISONE 10 MG PO TABS: ORAL_TABLET | ORAL | 0 refills | Status: DC

## 2023-03-31 MED ORDER — ALBUTEROL SULFATE (2.5 MG/3ML) 0.083% IN NEBU: 2.5000 mg | INHALATION_SOLUTION | Freq: Four times a day (QID) | RESPIRATORY_TRACT | 3 refills | Status: DC | PRN

## 2023-03-31 MED ORDER — DOXYCYCLINE HYCLATE 100 MG PO TABS: 100.0000 mg | ORAL_TABLET | Freq: Two times a day (BID) | ORAL | 0 refills | Status: DC

## 2023-03-31 MED ORDER — CLONAZEPAM 0.5 MG PO TABS: 0.5000 mg | ORAL_TABLET | Freq: Every day | ORAL | 0 refills | Status: DC

## 2023-03-31 NOTE — Progress Notes (Signed)
@Patient  ID: Joshua [place], male    DOB: Stephenson 20, 1957, 67 y.o.   MRN: [id number]  Chief Complaint  Patient presents with   Follow-up    Follow up CPAP compliance.  Still having daytime sleepiness on occasion.  Does have improved sleep most nights.    Referring provider: Charlane Ferretti, DO  HPI: 67 year old male, current smoker. PMH significant for COPD, OSA, lung nodules, HTN, GERD, cervical radiculopathy, CAD, depression. Patient of Dr. Maple Hudson, last seen on 03/02/22.   Previous [facility] pulmonary encounter:  03/02/22- 65 yoM Smoker (20pkyrs) followed for OSA,Lung Nodule, COPD, Tobacco Use, complicated by  HTN, GERD, Cervical Radiculopathy, Depression, CAD,  -Trelegy100, Neb Duoneb, Ventolin hfa Body weight today- CPAP  auto 5-15/[employer]     LUNA machine  Download- compliance 3.3%, AHI 2.3 Covid vax- none -----Doing well. No issues at this time. CPAP about a year old.  Not using because he dislikes masks that DME company has sent and he thinks the machine sounds wrong.  We discussed referral for mask fitting/desensitization.  Neck step would be referral to his DME to service the machine if appropriate. Discussed CT scan.  Lung nodule stable compared with 6 months prior.  We will update. COPD seems stable.  Still smokes a couple cigarettes daily.  Visitor told him he was wheezing and he admits he ran out of Trelegy sample.  Using his nebulizer twice daily and rescue inhaler occasionally if exerting. CT chest 12/08/21-  IMPRESSION: 1. 8 x 5 mm posterior right upper lobe pulmonary nodule is stable comparing back to lung cancer screening chest CT of 06/19/2021. Given 6 months of imaging stability, repeat CT chest without contrast in 6-12 months recommended to ensure continued stability. 2. Aortic Atherosclerosis (ICD10-I70.0) and Emphysema (ICD10-J43.9).   03/31/2023- Interim hx  Patient presents today for routine follow-up. Follows with Dr. Maple Hudson for OSA and COPD. CPAP compliance has been  an issue. He changed masks and headgear which helped some. He is currently using nasal mask. Feels ok when he is able to wear CPAP. It is hard for him to lay on his back due to shoulder pain. He is not sleeping at night, having difficulty tolerating cpap. He wakes up shortly after fallings asleep when he wears his CPAP. He is tired during the day.   He tells me that his breathing has been very good during the day. He was only using Trelegy when it was hot out. He is now using more consistently which has helped. He has a non-productive cough with intermittent wheezing. He is still smoking. He has a lot of anxiety, some of which is situational. He is currently taking 5mg  of Lexapro. No SI.   Airview download 02/15/23-03/16/23 Usage 7/30 days; 0 days > 4 hours Average usage days used 47 mins Pressure 5-15cm h20 AHI 2.3  No Known Allergies  Immunization History  Administered Date(s) Administered   Influenza,inj,Quad PF,6+ Mos 06/07/2014   Pneumococcal Polysaccharide-23 12/18/2020, 01/06/2022   Td 01/25/2018   Td (Adult) 01/25/2018   Tdap 02/14/2018    Past Medical History:  Diagnosis Date   Anxiety    Asthma    as a child   Atherosclerosis of aorta (HCC)    COPD, mild (HCC) 06/07/2014   Coronary artery calcification    Depression    Dyspnea    GERD (gastroesophageal reflux disease)    Hemorrhoids    Hx of adenomatous colonic polyps 07/08/2021   2 diminutive - repeat exam 2029   Hypertension  Sleep apnea    in process of obtaining cpap    Tobacco History: Social History   Tobacco Use  Smoking Status Every Day   Packs/day: 0.25   Years: 20.00   Additional pack years: 0.00   Total pack years: 5.00   Types: Cigarettes  Smokeless Tobacco Never  Tobacco Comments   5 cigs per day updated 03/31/2023 amy marsh, cma   Ready to quit: Not Answered Counseling given: Not Answered Tobacco comments: 5 cigs per day updated 03/31/2023 amy marsh, cma   Outpatient Medications Prior  to Visit  Medication Sig Dispense Refill   acetaminophen (TYLENOL) 500 MG tablet Take 2 tablets (1,000 mg total) by mouth every 6 (six) hours as needed. 30 tablet 0   albuterol (VENTOLIN HFA) 108 (90 Base) MCG/ACT inhaler INHALE 1 TO 2 PUFFS INTO THE LUNGS EVERY 6 HOURS AS NEEDED FOR WHEEZING OR SHORTNESS OF BREATH 54 g 1   Alcohol Swabs (DROPSAFE ALCOHOL PREP) 70 % PADS Apply topically.     aspirin EC 81 MG tablet Take 1 tablet (81 mg total) by mouth daily. Swallow whole. 30 tablet 12   atorvastatin (LIPITOR) 20 MG tablet TAKE 1 TABLET BY MOUTH EVERYDAY AT BEDTIME 90 tablet 0   Fluticasone-Umeclidin-Vilant (TRELEGY ELLIPTA) 100-62.5-25 MCG/ACT AEPB Inhale 1 puff into the lungs daily. 90 each 1   gabapentin (NEURONTIN) 300 MG capsule Take 300 mg by mouth 2 (two) times daily.     methocarbamol (ROBAXIN) 750 MG tablet Take 750 mg by mouth 4 (four) times daily.     metoprolol succinate (TOPROL-XL) 100 MG 24 hr tablet Take 100 mg by mouth daily.     nabumetone (RELAFEN) 750 MG tablet Take 750 mg by mouth 2 (two) times daily.     TRUE METRIX BLOOD GLUCOSE TEST test strip 1 each by Other route as needed.     TRUEplus Lancets 33G MISC      escitalopram (LEXAPRO) 10 MG tablet Take 10 mg by mouth daily. (Patient not taking: Reported on 03/31/2023)     metoprolol succinate (TOPROL-XL) 50 MG 24 hr tablet Take 50 mg by mouth daily. Take with or immediately following a meal.     No facility-administered medications prior to visit.    Review of Systems  Review of Systems  Constitutional:  Positive for fatigue.  HENT:  Positive for congestion.   Respiratory:  Positive for cough and wheezing.    Physical Exam  BP (!) 144/80 (BP Location: Left Arm, Patient Position: Sitting, Cuff Size: Large)   Pulse 85   Temp 98.9 F (37.2 C) (Oral)   Ht 5\' 7"  (1.702 m)   Wt 172 lb 12.8 oz (78.4 kg)   SpO2 96%   BMI 27.06 kg/m  Physical Exam Constitutional:      Appearance: Normal appearance.  HENT:      Head: Normocephalic and atraumatic.  Cardiovascular:     Rate and Rhythm: Normal rate and regular rhythm.  Pulmonary:     Effort: Pulmonary effort is normal.     Breath sounds: Rhonchi present.  Musculoskeletal:        General: Normal range of motion.  Skin:    General: Skin is warm and dry.  Neurological:     General: No focal deficit present.     Mental Status: He is alert and oriented to person, place, and time. Mental status is at baseline.  Psychiatric:        Mood and Affect: Mood normal.  Behavior: Behavior normal.        Thought Content: Thought content normal.        Judgment: Judgment normal.      Lab Results:  CBC    Component Value Date/Time   WBC 5.6 07/30/2021 0921   RBC 5.04 07/30/2021 0921   HGB 17.4 06/24/2022 1135   HCT 50.3 06/24/2022 1135   PLT 220.0 07/30/2021 0921   MCV 87.8 07/30/2021 0921   MCH 31.7 05/27/2014 1200   MCHC 34.6 07/30/2021 0921   RDW 15.2 07/30/2021 0921   LYMPHSABS 2.3 07/30/2021 0921   MONOABS 1.1 (H) 07/30/2021 0921   EOSABS 0.2 07/30/2021 0921   BASOSABS 0.0 07/30/2021 0921    BMET    Component Value Date/Time   NA 139 09/01/2022 1005   NA 138 06/24/2022 1135   K 4.0 09/01/2022 1005   CL 107 09/01/2022 1005   CO2 24 09/01/2022 1005   GLUCOSE 94 09/01/2022 1005   BUN 16 09/01/2022 1005   BUN 14 06/24/2022 1135   CREATININE 1.10 09/01/2022 1005   CALCIUM 9.4 09/01/2022 1005   GFRNONAA >60 07/31/2021 1200   GFRAA 71 11/05/2020 1147    BNP No results found for: "BNP"  ProBNP    Component Value Date/Time   PROBNP 22.2 05/27/2014 1201    Imaging: No results found.   Assessment & Plan:   OSA (obstructive sleep apnea) - HST 01/26/21>> AHI 9.3/hour. Compliance with CPAP remains very poor at 23% over the last 30 days with an average usage on days used 47 mins. Current pressure 5-15cm h20; Residual AHI 2.3. We will give him something to help with sleep to see if this can improve compliance. He has a lot of  anxiety and trouble sleeping. RX Clonazepam 0.5mg  at bedtime. FU in 4-8 weeks.   COPD, mild (HCC) - Mild exacerbation. He has a non- productive cough with intermittent wheezing. Rhonchi noted to left lung base on exam. Continue Trelegy 1 puff daily and prn albuterol nebulizer q 6 hours for breakthrough sob/wheezing. Checking CXR, sending in prednisone taper. Smoking cessation encouraged, anxiety contributing to his smoking.  Anxiety - Not well controlled. Currently taking 5mg  lexapro. Advised patient contact PCP about increasing anxiety medication to full 10mg  prescribed dose.   Lung nodule seen on imaging study - RUL was stable on imaging from [place] 2023 - Follow-up CT chest scheduled for end of July    Glenford Bayley, NP 03/31/2023

## 2023-03-31 NOTE — Assessment & Plan Note (Addendum)
-   Mild exacerbation. He has a non- productive cough with intermittent wheezing. Rhonchi noted to left lung base on exam. Continue Trelegy 1 puff daily and prn albuterol nebulizer q 6 hours for breakthrough sob/wheezing. Checking CXR, sending in prednisone taper. Smoking cessation encouraged, anxiety contributing to his smoking.

## 2023-03-31 NOTE — Assessment & Plan Note (Signed)
-   Not well controlled. Currently taking 5mg  lexapro. Advised patient contact PCP about increasing anxiety medication to full 10mg  prescribed dose.

## 2023-03-31 NOTE — Patient Instructions (Signed)
Recommendations: Please contact PCP about taking full dose of Lexapro 10mg  daily  Start clonazepam 0.5mg  at bedtime for help sleeping You need to wear your CPAP more consistently every night Take prednisone as directed for wheezing heard on exam Please get CXR today Cut down on smoking  Orders: CXR  Follow-up 4-8 weeks with Dr. Maple Hudson

## 2023-03-31 NOTE — Progress Notes (Signed)
Please let patient know CXR showed evidence of bronchitis, I will send in antibiotic along with the prednisone that I sent to [facility]. Needs repeat CXR at his follow-up in August with Dr. Maple Hudson

## 2023-03-31 NOTE — Assessment & Plan Note (Signed)
-   RUL was stable on imaging from March 2023 - Follow-up CT chest scheduled for end of July

## 2023-03-31 NOTE — Assessment & Plan Note (Signed)
-   HST 01/26/21>> AHI 9.3/hour. Compliance with CPAP remains very poor at 23% over the last 30 days with an average usage on days used 47 mins. Current pressure 5-15cm h20; Residual AHI 2.3. We will give him something to help with sleep to see if this can improve compliance. He has a lot of anxiety and trouble sleeping. RX Clonazepam 0.5mg  at bedtime. FU in 4-8 weeks.

## 2023-03-31 NOTE — Addendum Note (Signed)
Addended by: Glenford Bayley on: 03/31/2023 02:51 PM   Modules accepted: Orders

## 2023-04-10 ENCOUNTER — Other Ambulatory Visit: Payer: Self-pay | Admitting: Internal Medicine

## 2023-04-14 ENCOUNTER — Ambulatory Visit
Admission: RE | Admit: 2023-04-14 | Discharge: 2023-04-14 | Disposition: A | Discharge status: Home / Self Care | Payer: Medicare HMO | Source: Ambulatory Visit | Attending: Internal Medicine | Admitting: Internal Medicine

## 2023-04-14 DIAGNOSIS — R911 Solitary pulmonary nodule: Secondary | ICD-10-CM

## 2023-04-14 DIAGNOSIS — R0609 Other forms of dyspnea: Secondary | ICD-10-CM | POA: Diagnosis not present

## 2023-04-21 DIAGNOSIS — I700 Atherosclerosis of aorta: Secondary | ICD-10-CM | POA: Diagnosis not present

## 2023-04-21 DIAGNOSIS — J449 Chronic obstructive pulmonary disease, unspecified: Secondary | ICD-10-CM | POA: Diagnosis not present

## 2023-04-21 DIAGNOSIS — F331 Major depressive disorder, recurrent, moderate: Secondary | ICD-10-CM | POA: Diagnosis not present

## 2023-04-21 DIAGNOSIS — R972 Elevated prostate specific antigen [PSA]: Secondary | ICD-10-CM | POA: Diagnosis not present

## 2023-04-21 DIAGNOSIS — G4733 Obstructive sleep apnea (adult) (pediatric): Secondary | ICD-10-CM | POA: Diagnosis not present

## 2023-04-21 DIAGNOSIS — I25118 Atherosclerotic heart disease of native coronary artery with other forms of angina pectoris: Secondary | ICD-10-CM | POA: Diagnosis not present

## 2023-04-21 DIAGNOSIS — I10 Essential (primary) hypertension: Secondary | ICD-10-CM | POA: Diagnosis not present

## 2023-04-21 DIAGNOSIS — R7303 Prediabetes: Secondary | ICD-10-CM | POA: Diagnosis not present

## 2023-04-23 ENCOUNTER — Other Ambulatory Visit: Payer: Self-pay | Admitting: *Deleted

## 2023-04-23 DIAGNOSIS — F17210 Nicotine dependence, cigarettes, uncomplicated: Secondary | ICD-10-CM

## 2023-04-23 DIAGNOSIS — Z122 Encounter for screening for malignant neoplasm of respiratory organs: Secondary | ICD-10-CM

## 2023-04-23 DIAGNOSIS — Z87891 Personal history of nicotine dependence: Secondary | ICD-10-CM

## 2023-05-16 NOTE — Progress Notes (Unsigned)
HPI M former smoker (20pkyrs) followed for OSA, complicated by COPD, HTN, GERD, Cervical Radiculopathy, Depression, Lung Nodule,  CAD,  HST 01/26/21- AHI 9.3/ hr, desaturation to 86%, body weight 187 lbs CPAP auto 5-15/ [employer]   ordered 06/27/21 PFT 04/30/21-minimal obstruction, insignif resp to BD  FEV1/FVC 0.75 =========================================================== .  03/02/22- 65 yoM Smoker (20pkyrs) followed for OSA,Lung Nodule, COPD, Tobacco Use, complicated by  HTN, GERD, Cervical Radiculopathy, Depression, CAD,  -Trelegy100, Neb Duoneb, Ventolin hfa Body weight today- CPAP  auto 5-15/[employer]     LUNA machine  Download- compliance 3.3%, AHI 2.3 Covid vax- none -----Doing well. No issues at this time. CPAP about a year old.  Not using because he dislikes masks that DME company has sent and he thinks the machine sounds wrong.  We discussed referral for mask fitting/desensitization.  Neck step would be referral to his DME to service the machine if appropriate. Discussed CT scan.  Lung nodule stable compared with 6 months prior.  We will update. COPD seems stable.  Still smokes a couple cigarettes daily.  Visitor told him he was wheezing and he admits he ran out of Trelegy sample.  Using his nebulizer twice daily and rescue inhaler occasionally if exerting. CT chest 12/08/21-  IMPRESSION: 1. 8 x 5 mm posterior right upper lobe pulmonary nodule is stable comparing back to lung cancer screening chest CT of 06/19/2021. Given 6 months of imaging stability, repeat CT chest without contrast in 6-12 months recommended to ensure continued stability. 2. Aortic Atherosclerosis (ICD10-I70.0) and Emphysema (ICD10-J43.9).  05/17/23-65 yoM Smoker (20pkyrs) followed for OSA,Lung Nodule, COPD, Tobacco Use, complicated by  HTN, GERD, Cervical Radiculopathy, Depression, CAD,  -Trelegy100, Neb Duoneb, Ventolin hfa Body weight today- CPAP  auto 5-15/[employer]     LUNA machine  Download- compliance  LOV Walsh, NP  03/31/23-added sleep aide clonazepam 0.5 mg to improve compliance, pred taper and abx for COPD, advised contact PCP for help with anxiety,. CT chest 03/25/23 IMPRESSION: 1. Irregular solid 1.0 cm posterior right upper lobe pulmonary nodule, not substantially changed since baseline 06/19/2021 chest CT study, presumably benign. Recommend resuming screening chest CT program in 12 months. 2. One vessel coronary atherosclerosis. 3. Aortic Atherosclerosis (ICD10-I70.0) and Emphysema (ICD10-J43.9). CXR 03/31/23- IMPRESSION: Peribronchial thickening suggests bronchitis or interstitial pneumonia. There is no focal pulmonary consolidation.  ROS-see HPI   + = positive Constitutional:    weight loss, night sweats, fevers, chills, fatigue, lassitude. HEENT:    headaches, difficulty swallowing, tooth/dental problems, sore throat,       Sneezing,+ itching, ear ache, nasal congestion, post nasal drip, snoring CV:    chest pain, orthopnea, PND, swelling in lower extremities, anasarca,                                   dizziness, palpitations Resp:   +shortness of breath with exertion or at rest.                productive cough,   non-productive cough, coughing up of blood.              change in color of mucus.  wheezing.   Skin:    rash or lesions. GI:  + heartburn, indigestion, abdominal pain, nausea, vomiting, diarrhea,                 change in bowel habits, loss of appetite GU: dysuria, change in color of urine, no urgency  or frequency.   flank pain. MS:   joint pain, stiffness, decreased range of motion, back pain. Neuro-     nothing unusual Psych:  change in mood or affect.  depression or anxiety.   memory loss.  OBJ- Physical Exam General- Alert, Oriented, Affect-appropriate, Distress- none acute Skin- rash-none, lesions- none, excoriation- none Lymphadenopathy- none Head- atraumatic            Eyes- Gross vision intact, PERRLA, conjunctivae and secretions clear            Ears- Hearing,  canals-normal            Nose- Clear, no-Septal dev, mucus, polyps, erosion, perforation             Throat- Mallampati III , mucosa clear , drainage- none, tonsils- atrophic,  + teeth Neck- flexible , trachea midline, no stridor , thyroid nl, carotid no bruit Chest - symmetrical excursion , unlabored           Heart/CV- RRR , no murmur , no gallop  , no rub, nl s1 s2                           - JVD- none , edema- none, stasis changes- none, varices- none           Lung- clear to P&A, wheeze- none, cough- none , dullness-none, rub- none           Chest wall-  Abd-  Br/ Gen/ Rectal- Not done, not indicated Extrem- cyanosis- none, clubbing, none, atrophy- none, strength- nl Neuro- grossly intact to observation

## 2023-05-17 ENCOUNTER — Encounter: Payer: Self-pay | Admitting: Internal Medicine

## 2023-05-17 ENCOUNTER — Ambulatory Visit: Payer: Medicare HMO | Admitting: Internal Medicine

## 2023-05-17 VITALS — BP 138/78 | HR 79 | Ht 67.0 in | Wt 170.6 lb

## 2023-05-17 DIAGNOSIS — J449 Chronic obstructive pulmonary disease, unspecified: Secondary | ICD-10-CM | POA: Diagnosis not present

## 2023-05-17 DIAGNOSIS — G4733 Obstructive sleep apnea (adult) (pediatric): Secondary | ICD-10-CM

## 2023-05-17 DIAGNOSIS — R911 Solitary pulmonary nodule: Secondary | ICD-10-CM

## 2023-05-17 DIAGNOSIS — F17200 Nicotine dependence, unspecified, uncomplicated: Secondary | ICD-10-CM

## 2023-05-17 MED ORDER — TRELEGY ELLIPTA 200-62.5-25 MCG/ACT IN AEPB: INHALATION_SPRAY | RESPIRATORY_TRACT | 4 refills | Status: DC

## 2023-05-17 MED ORDER — TRELEGY ELLIPTA 200-62.5-25 MCG/ACT IN AEPB: 1.0000 | INHALATION_SPRAY | Freq: Every day | RESPIRATORY_TRACT | 0 refills | Status: DC

## 2023-05-17 NOTE — Assessment & Plan Note (Signed)
Try increase to Trelegy 200

## 2023-05-17 NOTE — Assessment & Plan Note (Signed)
Mild OSA. He has dislike CPAP and not been compliant. Plan- ok to dc CPAP for mild OSA. Encourage to sleep off back.

## 2023-05-17 NOTE — Assessment & Plan Note (Signed)
Continue to encourage effort to quit. He needs to decide that he wants to.

## 2023-05-17 NOTE — Assessment & Plan Note (Signed)
Unchanged 2022 to 204 Plan- resume low dose screening CT program

## 2023-05-17 NOTE — Patient Instructions (Addendum)
Since your sleep apnea is mild- it is ok for you to stop using CPAP as discussed.  Please keep trying to stop smoking  Order- resume low dose screening chest CT program in 1 year  Order- sample x 1 Trelegy 200    inhale 1 puff then rinse mouth, once daily. Try this instead of your Trelegy 100.  See if the morning cough gets better.  Script sent to drug store for Trelegy 200- sent to [facility]

## 2023-06-30 ENCOUNTER — Other Ambulatory Visit: Payer: Self-pay | Admitting: Internal Medicine

## 2023-07-26 ENCOUNTER — Ambulatory Visit: Payer: Medicare HMO | Attending: Cardiology | Admitting: Cardiology

## 2023-07-26 ENCOUNTER — Encounter: Payer: Self-pay | Admitting: Cardiology

## 2023-07-26 VITALS — BP 140/80 | HR 77 | Resp 16 | Ht 67.0 in | Wt 170.6 lb

## 2023-07-26 DIAGNOSIS — Z87891 Personal history of nicotine dependence: Secondary | ICD-10-CM | POA: Diagnosis not present

## 2023-07-26 DIAGNOSIS — I2584 Coronary atherosclerosis due to calcified coronary lesion: Secondary | ICD-10-CM | POA: Diagnosis not present

## 2023-07-26 DIAGNOSIS — I493 Ventricular premature depolarization: Secondary | ICD-10-CM | POA: Diagnosis not present

## 2023-07-26 DIAGNOSIS — G4730 Sleep apnea, unspecified: Secondary | ICD-10-CM | POA: Diagnosis not present

## 2023-07-26 DIAGNOSIS — I700 Atherosclerosis of aorta: Secondary | ICD-10-CM

## 2023-07-26 DIAGNOSIS — I2510 Atherosclerotic heart disease of native coronary artery without angina pectoris: Secondary | ICD-10-CM

## 2023-07-26 DIAGNOSIS — R002 Palpitations: Secondary | ICD-10-CM | POA: Diagnosis not present

## 2023-07-26 DIAGNOSIS — I10 Essential (primary) hypertension: Secondary | ICD-10-CM | POA: Diagnosis not present

## 2023-07-26 DIAGNOSIS — Z8249 Family history of ischemic heart disease and other diseases of the circulatory system: Secondary | ICD-10-CM

## 2023-07-26 MED ORDER — METOPROLOL SUCCINATE ER 50 MG PO TB24: 50.0000 mg | ORAL_TABLET | Freq: Every day | ORAL | 3 refills | Status: AC

## 2023-07-26 MED ORDER — ATORVASTATIN CALCIUM 20 MG PO TABS: ORAL_TABLET | ORAL | 3 refills | Status: AC

## 2023-07-26 MED ORDER — METOPROLOL SUCCINATE ER 50 MG PO TB24: 50.0000 mg | ORAL_TABLET | Freq: Every day | ORAL | 3 refills | Status: DC

## 2023-07-26 NOTE — Progress Notes (Signed)
Cardiology Office Note:  .   Date:  07/26/2023  ID:  Charisse March, DOB 1955/11/26, MRN [id number] PCP:  Charlane Ferretti, DO  Former Cardiology Providers: None [facility] Providers Cardiologist:  Tessa Lerner, DO , [facility] (established care January 2022) Electrophysiologist:  None  Click to update primary MD,subspecialty MD or APP then REFRESH:1}    Chief Complaint  Patient presents with   Follow-up    Palpitations/tachycardia    History of Present Illness: .   Joshua [place] is a 67 y.o. African-American male whose past medical history and cardiovascular risk factors includes:  Benign essential hypertension, sleep apnea, former smoker (69-pack-years hx of smoking), COPD, family history of premature CAD, aortic atherosclerosis, mild coronary artery calcification and minimal nonobstructive CAD.   Was referred to the practice for evaluation of chest pain.  He has undergone appropriate ischemic workup as outlined below.  During his follow-up visit in September 2023 he endorsed having palpitations and did undergo cardiac monitor which illustrated tachycardia burden of 10%.  He was started on pharmacological therapy and has done well.  An incidental finding of type I AV block was noted on his cardiac monitor which is an auto triggered event at 1:17 AM (likely secondary to apnea) he has a diagnosis of sleep apnea but noncompliant with CPAP machine.  While he was wearing the cardiac monitor he was not on beta-blocker therapy.  He presents today for a 1 year follow-up visit.  He denies anginal chest pain or heart failure symptoms.  For reasons unknown he stopped all his medications.   Review of Systems: .   Review of Systems  Cardiovascular:  Negative for chest pain, claudication, irregular heartbeat, leg swelling, near-syncope, orthopnea, palpitations, paroxysmal nocturnal dyspnea and syncope.  Respiratory:  Positive for shortness of breath (chronic and stable).    Hematologic/Lymphatic: Negative for bleeding problem.    Studies Reviewed:   EKG: EKG Interpretation Date/Time:  Monday July 26 2023 10:30:17 EST Ventricular Rate:  77 PR Interval:  184 QRS Duration:  90 QT Interval:  366 QTC Calculation: 414 R Axis:   -32  Text Interpretation:  Sinus rhythm with frequent Premature ventricular complexes Left axis deviation Anterior infarct , age undetermined When compared with ECG of 27-May-2014 11:12, Premature ventricular complexes are now Present Confirmed by Tessa Lerner [phone]) on 07/26/2023 10:41:35 AM  Echocardiogram: 10/10/2020: Normal LV systolic function with visual EF 55-60%. Left ventricle cavity is normal in size. Moderate left ventricular hypertrophy. Normal global wall motion. No significant valvular heart disease. No prior study for comparison   Stress Testing: No results found for this or any previous visit from the past 1095 days.   CCTA:  10/23/2020:  Total calcium score: 19 AU, which places the patient in the 63th percentile for age and sex matched control. Normal coronary origin with right dominance. CAD-RADS = 1. Minimal stenosis in distal LAD, Minimal stenosis in ostial LCX. Aortic atherosclerosis.  Cardiac monitor (Zio Patch): Dominant rhythm sinus, followed by tachycardia (10% burden). Heart rate 31-149 bpm.  Avg HR 75 bpm. No atrial fibrillation,  ventricular tachycardia, high grade AV block, pauses (3 seconds or longer). Total ventricular ectopic burden 1.1%. Total supraventricular ectopic burden <1%. Auto triggered event: 1 episode of sinus rhythm with second-degree type I AV block at 1:17 AM and 06/16/2022 Patient triggered events: 3.  Underlying rhythm sinus without dysrhythmia.  RADIOLOGY: NA  Risk Assessment/Calculations:   NA   Labs:       Latest Ref Rng &  Units 06/24/2022   11:35 AM 07/30/2021    9:21 AM 04/04/2018   10:24 AM  CBC  WBC 4.0 - 10.5 K/uL  5.6  6.4   Hemoglobin 13.0 - 17.7 g/dL  78.2  95.6  21.3   Hematocrit 37.5 - 51.0 % 50.3  44.3  40.5   Platelets 150.0 - 400.0 K/uL  220.0  279.0        Latest Ref Rng & Units 09/01/2022   10:05 AM 06/24/2022   11:35 AM 07/31/2021   12:00 PM  BMP  Glucose 70 - 99 mg/dL 94  086  98   BUN 6 - 23 mg/dL 16  14  10    Creatinine 0.40 - 1.50 mg/dL 5.78  4.69  6.29   BUN/Creat Ratio 10 - 24  12    Sodium 135 - 145 mEq/L 139  138  138   Potassium 3.5 - 5.1 mEq/L 4.0  4.3  3.8   Chloride 96 - 112 mEq/L 107  99  105   CO2 19 - 32 mEq/L 24  22  27    Calcium 8.4 - 10.5 mg/dL 9.4  52.8  9.1       Latest Ref Rng & Units 09/01/2022   10:05 AM 06/24/2022   11:35 AM 07/31/2021   12:00 PM  CMP  Glucose 70 - 99 mg/dL 94  413  98   BUN 6 - 23 mg/dL 16  14  10    Creatinine 0.40 - 1.50 mg/dL 2.44  0.10  2.72   Sodium 135 - 145 mEq/L 139  138  138   Potassium 3.5 - 5.1 mEq/L 4.0  4.3  3.8   Chloride 96 - 112 mEq/L 107  99  105   CO2 19 - 32 mEq/L 24  22  27    Calcium 8.4 - 10.5 mg/dL 9.4  53.6  9.1     Lab Results  Component Value Date   CHOL 162 06/16/2017   HDL 47.90 06/16/2017   LDLCALC 89 06/16/2017   TRIG 128.0 06/16/2017   CHOLHDL 3 06/16/2017   No results for input(s): "LIPOA" in the last 8760 hours. No components found for: "NTPROBNP" No results for input(s): "PROBNP" in the last 8760 hours. No results for input(s): "TSH" in the last 8760 hours.   Physical Exam:    Today's Vitals   07/26/23 1026  BP: (!) 140/80  Pulse: 77  Resp: 16  SpO2: 97%  Weight: 170 lb 9.6 oz (77.4 kg)  Height: 5\' 7"  (1.702 m)   Body mass index is 26.72 kg/m. Wt Readings from Last 3 Encounters:  07/26/23 170 lb 9.6 oz (77.4 kg)  05/17/23 170 lb 9.6 oz (77.4 kg)  03/31/23 172 lb 12.8 oz (78.4 kg)    Physical Exam  Constitutional: No distress.  hemodynamically stable  Neck: No JVD present.  Cardiovascular: Normal rate, regular rhythm, S1 normal and S2 normal. Exam reveals no gallop, no S3 and no S4.  No murmur  heard. Pulmonary/Chest: Effort normal and breath sounds normal. No stridor. He has no wheezes. He has no rales.  Abdominal: Soft. Bowel sounds are normal. He exhibits no distension. There is no abdominal tenderness.  Musculoskeletal:        General: No edema.     Cervical back: Neck supple.  Neurological: He is alert and oriented to person, place, and time. He has intact cranial nerves (2-12).  Skin: Skin is warm.    Impression & Recommendation(s):  Impression:  ICD-10-CM   1. Premature ventricular contraction  I49.3 metoprolol succinate (TOPROL XL) 50 MG 24 hr tablet    DISCONTINUED: metoprolol succinate (TOPROL XL) 50 MG 24 hr tablet    2. Palpitations  R00.2 EKG 12-Lead    metoprolol succinate (TOPROL XL) 50 MG 24 hr tablet    DISCONTINUED: metoprolol succinate (TOPROL XL) 50 MG 24 hr tablet    3. Coronary atherosclerosis due to calcified coronary lesion  I25.10 Comprehensive metabolic panel   G38.75 Lipid panel    LDL cholesterol, direct    atorvastatin (LIPITOR) 20 MG tablet    LDL cholesterol, direct    Lipid panel    Comprehensive metabolic panel    4. Atherosclerosis of aorta (HCC)  I70.0     5. Benign hypertension  I10     6. Sleep apnea in adult  G47.30     7. Former smoker  Z87.891     8. Family history of premature CAD  Z82.49        Recommendation(s):  Premature ventricular contraction Palpitations Has undergone appropriate workup in the past. Zio patch has illustrated a tachycardia burden 10%. In the past he used to drink 6 bottles of soda on a regular basis he is now down to 3 - 12 ounce bottles per day. Patient stopped taking Toprol-XL as he ran out of medications-did not take the courtesy to call us back for refills. He was on Toprol-XL 100 mg p.o. daily. Will restart Toprol-XL at 50 mg p.o. daily. EKG shows frequent PVCs. Monitor for now   Coronary atherosclerosis due to calcified coronary lesion Atherosclerosis of aorta (HCC) Family history  of premature CAD Continue aspirin 81 mg p.o. daily. Stop taking Lipitor as well-ran out of medications. Will refill Lipitor at 20 mg p.o. daily as originally prescribed. Labs in 6 weeks to reevaluate LFTs and lipids.  Benign hypertension Office blood pressures are at the upper limits of normal. Likely secondary to medication noncompliance. Reemphasized importance of low-salt diet. Will restart medications as discussed above. He will follow-up with PCP if needed.  Sleep apnea in adult Currently not on CPAP. Reemphasized its importance and encouraged compliance.  Orders Placed:  Orders Placed This Encounter  Procedures   Comprehensive metabolic panel    Standing Status:   Future    Number of Occurrences:   1    Standing Expiration Date:   07/25/2024    Order Specific Question:   Has the patient fasted?    Answer:   Yes   Lipid panel    Standing Status:   Future    Number of Occurrences:   1    Standing Expiration Date:   07/25/2024    Order Specific Question:   Has the patient fasted?    Answer:   Yes   LDL cholesterol, direct    Standing Status:   Future    Number of Occurrences:   1    Standing Expiration Date:   07/25/2024   EKG 12-Lead   As part of medical decision making independently reviewed EKG, prior cardiac monitor results as part of today's office visit  Final Medication List:    Meds ordered this encounter  Medications   DISCONTD: metoprolol succinate (TOPROL XL) 50 MG 24 hr tablet    Sig: Take 1 tablet (50 mg total) by mouth daily. Take with or immediately following a meal.    Dispense:  90 tablet    Refill:  3   metoprolol succinate (TOPROL XL) 50  MG 24 hr tablet    Sig: Take 1 tablet (50 mg total) by mouth daily. Take with or immediately following a meal.    Dispense:  90 tablet    Refill:  3   atorvastatin (LIPITOR) 20 MG tablet    Sig: TAKE 1 TABLET BY MOUTH EVERYDAY AT BEDTIME    Dispense:  90 tablet    Refill:  3    Medications Discontinued  During This Encounter  Medication Reason   acetaminophen (TYLENOL) 500 MG tablet Patient Preference   Fluticasone-Umeclidin-Vilant (TRELEGY ELLIPTA) 200-62.5-25 MCG/ACT AEPB Duplicate   TRUE METRIX BLOOD GLUCOSE TEST test strip Patient Preference   TRUEplus Lancets 33G MISC Patient Preference   Alcohol Swabs (DROPSAFE ALCOHOL PREP) 70 % PADS Patient Preference   metoprolol succinate (TOPROL-XL) 100 MG 24 hr tablet Discontinued by provider   metoprolol succinate (TOPROL XL) 50 MG 24 hr tablet    atorvastatin (LIPITOR) 20 MG tablet Reorder     Current Outpatient Medications:    albuterol (PROVENTIL) (2.5 MG/3ML) 0.083% nebulizer solution, Take 3 mLs (2.5 mg total) by nebulization every 6 (six) hours as needed for wheezing or shortness of breath., Disp: 75 mL, Rfl: 3   albuterol (VENTOLIN HFA) 108 (90 Base) MCG/ACT inhaler, INHALE 1 TO 2 PUFFS INTO THE LUNGS EVERY 6 HOURS AS NEEDED FOR WHEEZING OR SHORTNESS OF BREATH, Disp: 54 g, Rfl: 1   aspirin EC 81 MG tablet, Take 1 tablet (81 mg total) by mouth daily. Swallow whole., Disp: 30 tablet, Rfl: 12   Fluticasone-Umeclidin-Vilant (TRELEGY ELLIPTA) 200-62.5-25 MCG/ACT AEPB, Inhale 1 puff then rinse mouth, once daily, Disp: 180 each, Rfl: 4   atorvastatin (LIPITOR) 20 MG tablet, TAKE 1 TABLET BY MOUTH EVERYDAY AT BEDTIME, Disp: 90 tablet, Rfl: 3   clonazePAM (KLONOPIN) 0.5 MG tablet, Take 1 tablet (0.5 mg total) by mouth at bedtime. (Patient not taking: Reported on 07/26/2023), Disp: 30 tablet, Rfl: 0   escitalopram (LEXAPRO) 10 MG tablet, Take 10 mg by mouth daily. (Patient not taking: Reported on 07/26/2023), Disp: , Rfl:    gabapentin (NEURONTIN) 300 MG capsule, Take 300 mg by mouth 2 (two) times daily. (Patient not taking: Reported on 07/26/2023), Disp: , Rfl:    methocarbamol (ROBAXIN) 750 MG tablet, Take 750 mg by mouth 4 (four) times daily. (Patient not taking: Reported on 07/26/2023), Disp: , Rfl:    metoprolol succinate (TOPROL XL) 50 MG 24 hr  tablet, Take 1 tablet (50 mg total) by mouth daily. Take with or immediately following a meal., Disp: 90 tablet, Rfl: 3   nabumetone (RELAFEN) 750 MG tablet, Take 750 mg by mouth 2 (two) times daily. (Patient not taking: Reported on 07/26/2023), Disp: , Rfl:   Consent:   NA  Disposition:   Follow-up in 1 year or sooner if needed Patient may be asked to follow-up sooner based on the results of the above-mentioned testing.  His questions and concerns were addressed to his satisfaction. He voices understanding of the recommendations provided during this encounter.    Signed, Tessa Lerner, DO, [facility]  [facility]  [address] [place], [place] [address] 07/26/2023 12:57 PM

## 2023-07-26 NOTE — Patient Instructions (Addendum)
Medication Instructions:  Your physician has recommended you make the following change in your medication:   STOP Metoprolol Succinate (Toprol XL) 100 mg  START Metoprolol Succinate (Toprol XL) 50 mg once daily   Refill for Lipitor sent to pharmacy. For future refills, please contact your PCP.   *If you need a refill on your cardiac medications before your next appointment, please call your pharmacy*  Lab Work: FASTING Lipid panel, direct LDL, and CMP to be completed in 6 weeks  If you have labs (blood work) drawn today and your tests are completely normal, you will receive your results only by: MyChart Message (if you have MyChart) OR A paper copy in the mail If you have any lab test that is abnormal or we need to change your treatment, we will call you to review the results.  Testing/Procedures: None ordered today.  Follow-Up: At [facility], you and your health needs are our priority.  As part of our continuing mission to provide you with exceptional heart care, we have created designated Provider Care Teams.  These Care Teams include your primary Cardiologist (physician) and Advanced Practice Providers (APPs -  Physician Assistants and Nurse Practitioners) who all work together to provide you with the care you need, when you need it.   Your next appointment:   1 year(s)  The format for your next appointment:   In Person  Provider:   Tessa Lerner, DO {

## 2023-08-05 DIAGNOSIS — I10 Essential (primary) hypertension: Secondary | ICD-10-CM | POA: Diagnosis not present

## 2023-08-05 DIAGNOSIS — I700 Atherosclerosis of aorta: Secondary | ICD-10-CM | POA: Diagnosis not present

## 2023-08-05 DIAGNOSIS — I25118 Atherosclerotic heart disease of native coronary artery with other forms of angina pectoris: Secondary | ICD-10-CM | POA: Diagnosis not present

## 2023-08-05 DIAGNOSIS — G4700 Insomnia, unspecified: Secondary | ICD-10-CM | POA: Diagnosis not present

## 2023-08-05 DIAGNOSIS — G4733 Obstructive sleep apnea (adult) (pediatric): Secondary | ICD-10-CM | POA: Diagnosis not present

## 2023-08-05 DIAGNOSIS — J449 Chronic obstructive pulmonary disease, unspecified: Secondary | ICD-10-CM | POA: Diagnosis not present

## 2023-08-05 DIAGNOSIS — R7303 Prediabetes: Secondary | ICD-10-CM | POA: Diagnosis not present

## 2023-08-05 DIAGNOSIS — R972 Elevated prostate specific antigen [PSA]: Secondary | ICD-10-CM | POA: Diagnosis not present

## 2023-08-30 DIAGNOSIS — I2510 Atherosclerotic heart disease of native coronary artery without angina pectoris: Secondary | ICD-10-CM | POA: Diagnosis not present

## 2023-08-30 DIAGNOSIS — I2584 Coronary atherosclerosis due to calcified coronary lesion: Secondary | ICD-10-CM | POA: Diagnosis not present

## 2023-08-31 LAB — COMPREHENSIVE METABOLIC PANEL
ALT: 12 [IU]/L (ref 0–44)
AST: 17 [IU]/L (ref 0–40)
Albumin: 4.2 g/dL (ref 3.9–4.9)
Alkaline Phosphatase: 95 [IU]/L (ref 44–121)
BUN/Creatinine Ratio: 7 — ABNORMAL LOW (ref 10–24)
BUN: 8 mg/dL (ref 8–27)
Bilirubin Total: 0.4 mg/dL (ref 0.0–1.2)
CO2: 26 mmol/L (ref 20–29)
Calcium: 9.9 mg/dL (ref 8.6–10.2)
Chloride: 101 mmol/L (ref 96–106)
Creatinine, Ser: 1.12 mg/dL (ref 0.76–1.27)
Globulin, Total: 3 g/dL (ref 1.5–4.5)
Glucose: 101 mg/dL — ABNORMAL HIGH (ref 70–99)
Potassium: 4.3 mmol/L (ref 3.5–5.2)
Sodium: 139 mmol/L (ref 134–144)
Total Protein: 7.2 g/dL (ref 6.0–8.5)
eGFR: 73 mL/min/{1.73_m2} (ref >59)

## 2023-08-31 LAB — LIPID PANEL
Chol/HDL Ratio: 3.1 {ratio} (ref 0.0–5.0)
Cholesterol, Total: 116 mg/dL (ref 100–199)
HDL: 37 mg/dL — ABNORMAL LOW (ref >39)
LDL Chol Calc (NIH): 63 mg/dL (ref 0–99)
Triglycerides: 82 mg/dL (ref 0–149)
VLDL Cholesterol Cal: 16 mg/dL (ref 5–40)

## 2023-08-31 LAB — LDL CHOLESTEROL, DIRECT: LDL Direct: 63 mg/dL (ref 0–99)

## 2023-09-10 DIAGNOSIS — N401 Enlarged prostate with lower urinary tract symptoms: Secondary | ICD-10-CM | POA: Diagnosis not present

## 2023-09-10 DIAGNOSIS — N5201 Erectile dysfunction due to arterial insufficiency: Secondary | ICD-10-CM | POA: Diagnosis not present

## 2023-09-10 DIAGNOSIS — R3915 Urgency of urination: Secondary | ICD-10-CM | POA: Diagnosis not present

## 2023-09-10 DIAGNOSIS — R972 Elevated prostate specific antigen [PSA]: Secondary | ICD-10-CM | POA: Diagnosis not present

## 2023-09-10 DIAGNOSIS — R351 Nocturia: Secondary | ICD-10-CM | POA: Diagnosis not present

## 2023-11-17 ENCOUNTER — Telehealth: Payer: Self-pay | Admitting: *Deleted

## 2023-11-17 NOTE — Telephone Encounter (Signed)
 Left  message on voicemail that if he has been using his CPAP to bring his sd card/machine to office visit tomorrow with Dr. Jetty Duhamel.

## 2023-11-17 NOTE — Progress Notes (Unsigned)
 HPI M former smoker (20pkyrs) followed for OSA, complicated by COPD, HTN, GERD, Cervical Radiculopathy, Depression, Lung Nodule,  CAD,  HST 01/26/21- AHI 9.3/ hr, desaturation to 86%, body weight 187 lbs CPAP auto 5-15/ [employer]   ordered 06/27/21 PFT 04/30/21-minimal obstruction, insignif resp to BD  FEV1/FVC 0.75 =========================================================== .   05/17/23-65 yoM Smoker (20pkyrs) followed for OSA, Lung Nodule, COPD, Tobacco Use, complicated by  HTN, GERD, Cervical Radiculopathy, Depression, CAD,  -Trelegy100, Neb Duoneb, Ventolin hfa Body weight today- CPAP  auto 5-15/[employer]     LUNA machine dc'd by patient Download- compliance  0%, AHI 2.2/ hr LOV Walsh, NP 03/31/23-added sleep aide clonazepam 0.5 mg to improve compliance, pred taper and abx for COPD, advised contact PCP for help with anxiety,. Download reviewed.  He is not using CPAP but admits he does not like it.  He has baseline AHI was only 9/h which is mild enough that medical need for treatment is not compelling.  He is going to keep his weight down and watch for now. Again discussed importance of smoking cessation and encouraged him to try. We will renew Greening CT program.  Lung nodule is acting benign without change in 2 years. He describes morning cough and wakes with an itching sensation in his chest.  We are going to try increasing to Trelegy 200. CT chest 03/25/23 IMPRESSION: 1. Irregular solid 1.0 cm posterior right upper lobe pulmonary nodule, not substantially changed since baseline 06/19/2021 chest CT study, presumably benign. Recommend resuming screening chest CT program in 12 months. 2. One vessel coronary atherosclerosis. 3. Aortic Atherosclerosis (ICD10-I70.0) and Emphysema (ICD10-J43.9). CXR 03/31/23- IMPRESSION: Peribronchial thickening suggests bronchitis or interstitial pneumonia. There is no focal pulmonary consolidation.  CT 11/18/23- 67 yoM Smoker (20pkyrs) followed for OSA(quit CPAP),  Lung Nodule, COPD, Tobacco Use, complicated by  HTN, GERD, Cervical Radiculopathy, Depression, CAD,  -Trelegy200, Neb Duoneb, Ventolin hfa Body weight today-164 lbs    Weighed 176 in 2023 CPAP  auto 5-15/[employer]     LUNA machine ([employer] for download) PCP has addressed Insomnia with sleep hygiene counseling. Using CPAP about 1x/ week- no download. Sleeps better with it, but only uses it when breathing is otherwise good so he won't have to take it of to cough. Discussed. Using rescue inhaler 5x/day and also Trelegy 200. Uses nebulizer once on most days. Still smoking. Now about 3 cigs/ day- discussed. Due for next screening CT in August. Denies hemoptysis, but says he gets up in AM sometimes nauseated, vomits up some blood. Denies black stools, but says stools often watery. Instructed to discuss these issues with his PCP soon  ROS-see HPI   + = positive Constitutional:    weight loss, night sweats, fevers, chills, fatigue, lassitude. HEENT:    headaches, difficulty swallowing, tooth/dental problems, sore throat,       Sneezing, itching, ear ache, nasal congestion, post nasal drip, snoring CV:    chest pain, orthopnea, PND, swelling in lower extremities, anasarca,                                   dizziness, palpitations Resp:   +shortness of breath with exertion or at rest.                productive cough,   non-productive cough, coughing up of blood.              change in color of mucus.  wheezing.  Skin:    rash or lesions. GI:  + heartburn, indigestion, abdominal pain, nausea, +vomiting, +diarrhea,                 +change in bowel habits, loss of appetite GU: dysuria, change in color of urine, no urgency or frequency.   flank pain. MS:   joint pain, stiffness, decreased range of motion, back pain. Neuro-     nothing unusual Psych:  change in mood or affect.  depression or anxiety.   memory loss.  OBJ- Physical Exam General- Alert, Oriented, Affect-appropriate, Distress- none acute,  +slender Skin- rash-none, lesions- none, excoriation- none Lymphadenopathy- none Head- atraumatic            Eyes- Gross vision intact, PERRLA, conjunctivae and secretions clear            Ears- Hearing, canals-normal            Nose- Clear, no-Septal dev, mucus, polyps, erosion, perforation             Throat- Mallampati III , mucosa clear , drainage- none, tonsils- atrophic,  + teeth Neck- flexible , trachea midline, no stridor , thyroid nl, carotid no bruit Chest - symmetrical excursion , unlabored           Heart/CV- RRR , no murmur , no gallop  , no rub, nl s1 s2                           - JVD- none , edema- none, stasis changes- none, varices- none           Lung-  wheeze-none, cough- none , dullness-none, rub- none           Chest wall-  Abd-  Br/ Gen/ Rectal- Not done, not indicated Extrem- cyanosis- none, clubbing, none, atrophy- none, strength- nl Neuro- grossly intact to observation

## 2023-11-18 ENCOUNTER — Ambulatory Visit: Payer: 59 | Admitting: Internal Medicine

## 2023-11-18 ENCOUNTER — Encounter: Payer: Self-pay | Admitting: Internal Medicine

## 2023-11-18 ENCOUNTER — Ambulatory Visit: Payer: 59

## 2023-11-18 VITALS — BP 118/62 | HR 75 | Temp 98.2°F | Ht 67.0 in | Wt 164.8 lb

## 2023-11-18 DIAGNOSIS — G4733 Obstructive sleep apnea (adult) (pediatric): Secondary | ICD-10-CM

## 2023-11-18 DIAGNOSIS — F17210 Nicotine dependence, cigarettes, uncomplicated: Secondary | ICD-10-CM

## 2023-11-18 DIAGNOSIS — J449 Chronic obstructive pulmonary disease, unspecified: Secondary | ICD-10-CM

## 2023-11-18 DIAGNOSIS — R911 Solitary pulmonary nodule: Secondary | ICD-10-CM

## 2023-11-18 DIAGNOSIS — K920 Hematemesis: Secondary | ICD-10-CM

## 2023-11-18 NOTE — Assessment & Plan Note (Signed)
 Seems to describe occ morning vomiting with hematemesis. Note weight loss over last 2 years, loose stools but not melena. Plan- firmly instructed to discuss these issues with PCP soon

## 2023-11-18 NOTE — Assessment & Plan Note (Signed)
 Feels benefit from CPAP when used, but indicates his cough prevents regular use Plan- continue current settings, emphasize compliance goals and medical benefit.

## 2023-11-18 NOTE — Assessment & Plan Note (Signed)
 Stable on screening CT 2024 Plan- continue annual low dose screen

## 2023-11-18 NOTE — Patient Instructions (Signed)
 Order- schedule PFT   dx COPD mixed type  Order- CXR   dx COPD mixed type  See your primary doctor and tell him about the blood and loose stools.

## 2023-11-18 NOTE — Assessment & Plan Note (Signed)
 Ongoing cough and active need for bronchodilator meds Plan- smoking cessation. Update CXR, PFT, then consider if there is role for Dupixent/ Frederic Jericho

## 2023-11-30 DIAGNOSIS — C61 Malignant neoplasm of prostate: Secondary | ICD-10-CM | POA: Diagnosis not present

## 2023-12-01 ENCOUNTER — Encounter: Payer: Self-pay | Admitting: Radiation Oncology

## 2023-12-01 NOTE — Progress Notes (Signed)
 GU Location of Tumor / Histology: Prostate Ca  If Prostate Cancer, Gleason Score is (3 + 3) and PSA is (4.8 on 01/12/2023)  Charisse March presented as referral from Dr. Kasandra Knudsen [facility]) elevated PSA.  Biopsies      Past/Anticipated interventions by urology, if any:     Past/Anticipated interventions by medical oncology, if any: NA  Weight changes, if any:  Weight loss 10-15 lbs in six months.  IPSS:  5 SHIM:  12  Bowel/Bladder complaints, if any:  No  Nausea/Vomiting, if any:  Yes  Pain issues, if any:  0/10 not today, left abdomen yesterday 8/10 no pain mediation taken at that time per patient.  SAFETY ISSUES: Prior radiation? No Pacemaker/ICD? No Possible current pregnancy? Male  Is the patient on methotrexate? No  Current Complaints / other details:

## 2023-12-06 NOTE — Progress Notes (Signed)
 Radiation Oncology         [phone] ________________________________  Initial Outpatient Consultation  Name: Joshua Stephenson MRN: [id number]  Date: 12/07/2023  DOB: 11/27/1955  [id number], Eliberto Ivory, DO  Pace, Weyman Croon, MD   REFERRING PHYSICIAN: Noel Christmas, MD  DIAGNOSIS: 68 y.o. gentleman with Stage T1c adenocarcinoma of the prostate with Gleason score of 3+3, and PSA of 4.82.  No diagnosis found.  HISTORY OF PRESENT ILLNESS: Joshua Stephenson is a 68 y.o. male with a diagnosis of prostate cancer. He was noted to have an elevated PSA of 4.82 by his primary care physician, Dr. Thornell Mule.  Accordingly, he was referred for evaluation in urology by Dr. Arita Miss on 09/10/23,  digital rectal examination performed at that time showed no nodules or induration.  The patient proceeded to transrectal ultrasound with 12 biopsies of the prostate on 11/12/23.  The prostate volume measured 26.41 cc.  Out of 12 core biopsies, 4 were positive.  The maximum Gleason score was 3+3, and this was seen in right base, right mid, right apex (with perineural invasion), and right base lateral.  The patient reviewed the biopsy results with his urologist and he has kindly been referred today for discussion of potential radiation treatment options.   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   Coronary artery calcification    Depression    Dyspnea    ED (erectile dysfunction)    Elevated PSA    GERD (gastroesophageal reflux disease)    Hemorrhoids    Hx of adenomatous colonic polyps 07/08/2021   2 diminutive - repeat exam 2029   Hypercholesterolemia    Hypertension    Nocturia    Sleep apnea    in process of obtaining cpap   Urinary urgency       PAST SURGICAL HISTORY: Past  Surgical History:  Procedure Laterality Date   ANTERIOR CERVICAL DECOMP/DISCECTOMY FUSION N/A 08/07/2021   Procedure: Anterior Cervical Decompression/discectomy Fusion Cervical four five, Cervical five six, Cervical six seven;  Surgeon: Lisbeth Renshaw, MD;  Location: [facility] OR;  Service: Neurosurgery;  Laterality: N/A;  3C   COLONOSCOPY  2011   PROSTATE BIOPSY     SHOULDER ARTHROSCOPY W/ ROTATOR CUFF REPAIR Bilateral 02/08/2018    FAMILY HISTORY:  Family History  Problem Relation Age of Onset   Breast cancer Mother    Hypertension Mother    Diabetes Father    Heart disease Father    Hypertension Father    Cancer Sister        type unknown   Breast cancer Sister    Cancer Brother        oral cancer    Cancer Brother        oral cancer    Breast cancer Niece    Breast cancer Niece    Colon cancer Neg Hx    Esophageal cancer Neg Hx    Rectal cancer Neg Hx    Stomach cancer Neg Hx    Colon polyps Neg Hx     SOCIAL HISTORY:  Social History   Socioeconomic History   Marital status: Legally Separated    Spouse name: Not on file   Number of children: 1    Years  of education: 12    Highest education level: Not on file  Occupational History   Occupation: Information systems manager: [employer]  Tobacco Use   Smoking status: Every Day    Current packs/day: 0.25    Average packs/day: 0.3 packs/day for 20.0 years (5.0 ttl pk-yrs)    Types: Cigarettes    Passive exposure: Current   Smokeless tobacco: Never   Tobacco comments:    Patient smokes 1/2 pack per day//PAP 05/17/23  Vaping Use   Vaping status: Never Used  Substance and Sexual Activity   Alcohol use: Not Currently   Drug use: No   Sexual activity: Yes    Birth control/protection: None  Other Topics Concern   Not on file  Social History Narrative   Married   1 grown daughter (prior relationship I think) lives [place] of [address]    He likes to be outside.    Works at [employer] for [employer]    Former smoker, no EtOH and no caffeine or drugs      Social Drivers of Corporate investment banker Strain: Not on file  Food Insecurity: No Food Insecurity (06/05/2022)   Hunger Vital Sign    Worried About Running Out of Food in the Last Year: Never true    Ran Out of Food in the Last Year: Never true  Transportation Needs: No Transportation Needs (06/05/2022)   PRAPARE - Administrator, Civil Service (Medical): No    Lack of Transportation (Non-Medical): No  Physical Activity: Not on file  Stress: Not on file  Social Connections: Not on file  Intimate Partner Violence: Not on file    ALLERGIES: Patient has no known allergies.  MEDICATIONS:  Current Outpatient Medications  Medication Sig Dispense Refill   sildenafil (VIAGRA) 100 MG tablet Take 100 mg by mouth as needed for erectile dysfunction.     tamsulosin (FLOMAX) 0.4 MG CAPS capsule Take 0.4 mg by mouth at bedtime.     albuterol (PROVENTIL) (2.5 MG/3ML) 0.083% nebulizer solution Take 3 mLs (2.5 mg total) by nebulization every 6 (six) hours as needed for wheezing or shortness of breath. 75 mL 3   albuterol (VENTOLIN HFA) 108 (90 Base) MCG/ACT inhaler INHALE 1 TO 2 PUFFS INTO THE LUNGS EVERY 6 HOURS AS NEEDED FOR WHEEZING OR SHORTNESS OF BREATH 54 g 1   aspirin EC 81 MG tablet Take 1 tablet (81 mg total) by mouth daily. Swallow whole. 30 tablet 12   atorvastatin (LIPITOR) 20 MG tablet TAKE 1 TABLET BY MOUTH EVERYDAY AT BEDTIME 90 tablet 3   clonazePAM (KLONOPIN) 0.5 MG tablet Take 1 tablet (0.5 mg total) by mouth at bedtime. 30 tablet 0   escitalopram (LEXAPRO) 10 MG tablet Take 10 mg by mouth daily.     Fluticasone-Umeclidin-Vilant (TRELEGY ELLIPTA) 200-62.5-25 MCG/ACT AEPB Inhale 1 puff then rinse mouth, once daily 180 each 4   gabapentin (NEURONTIN) 300 MG capsule Take 300 mg by mouth 2 (two) times daily.     methocarbamol (ROBAXIN) 750 MG tablet Take 750 mg by mouth 4 (four) times daily.     metoprolol succinate  (TOPROL XL) 50 MG 24 hr tablet Take 1 tablet (50 mg total) by mouth daily. Take with or immediately following a meal. 90 tablet 3   nabumetone (RELAFEN) 750 MG tablet Take 750 mg by mouth 2 (two) times daily. (Patient not taking: Reported on 11/18/2023)     No current facility-administered  medications for this encounter.    REVIEW OF SYSTEMS:  On review of systems, the patient reports that he is doing well overall. He denies any chest pain, shortness of breath, cough, fevers, chills, night sweats, unintended weight changes. He denies any bowel disturbances, and denies abdominal pain, nausea or vomiting. He denies any new musculoskeletal or joint aches or pains. His IPSS was ***, indicating *** urinary symptoms. His SHIM was ***, indicating he {does not have/has mild/moderate/severe} erectile dysfunction. A complete review of systems is obtained and is otherwise negative.    PHYSICAL EXAM:  Wt Readings from Last 3 Encounters:  11/18/23 164 lb 12.8 oz (74.8 kg)  07/26/23 170 lb 9.6 oz (77.4 kg)  05/17/23 170 lb 9.6 oz (77.4 kg)   Temp Readings from Last 3 Encounters:  11/18/23 98.2 F (36.8 C) (Oral)  03/31/23 98.9 F (37.2 C) (Oral)  07/24/22 98.4 F (36.9 C) (Temporal)   BP Readings from Last 3 Encounters:  11/18/23 118/62  07/26/23 (!) 140/80  05/17/23 138/78   Pulse Readings from Last 3 Encounters:  11/18/23 75  07/26/23 77  05/17/23 79    /10  In general this is a well appearing *** male in no acute distress. He's alert and oriented x4 and appropriate throughout the examination. Cardiopulmonary assessment is negative for acute distress, and he exhibits normal effort.     KPS = ***  100 - Normal; no complaints; no evidence of disease. 90   - Able to carry on normal activity; minor signs or symptoms of disease. 80   - Normal activity with effort; some signs or symptoms of disease. 64   - Cares for self; unable to carry on normal activity or to do active work. 60   -  Requires occasional assistance, but is able to care for most of his personal needs. 50   - Requires considerable assistance and frequent medical care. 40   - Disabled; requires special care and assistance. 30   - Severely disabled; hospital admission is indicated although death not imminent. 20   - Very sick; hospital admission necessary; active supportive treatment necessary. 10   - Moribund; fatal processes progressing rapidly. 0     - Dead  Karnofsky DA, Abelmann WH, Craver LS and Burchenal [facility] [phone]) The use of the nitrogen mustards in the palliative treatment of carcinoma: with particular reference to bronchogenic carcinoma Cancer 1 634-56  LABORATORY DATA:  Lab Results  Component Value Date   WBC 5.6 07/30/2021   HGB 17.4 06/24/2022   HCT 50.3 06/24/2022   MCV 87.8 07/30/2021   PLT 220.0 07/30/2021   Lab Results  Component Value Date   NA 139 08/30/2023   K 4.3 08/30/2023   CL 101 08/30/2023   CO2 26 08/30/2023   Lab Results  Component Value Date   ALT 12 08/30/2023   AST 17 08/30/2023   ALKPHOS 95 08/30/2023   BILITOT 0.4 08/30/2023     RADIOGRAPHY: DG Chest 2 View Result Date: 12/03/2023 CLINICAL DATA:  68 year old male with COPD EXAM: CHEST - 2 VIEW COMPARISON:  03/31/2023 FINDINGS: Cardiomediastinal silhouette unchanged in size and contour. Improving aeration of the lungs. Background of coarsened interstitial markings. No new airspace disease. No pneumothorax or pleural effusion. Stigmata of emphysema, with increased retrosternal airspace, flattened hemidiaphragms, increased AP diameter, and hyperinflation on the AP view. Degenerative changes of the spine. IMPRESSION: Emphysema without evidence of acute cardiopulmonary disease Electronically Signed   By: Gilmer Mor D.O.  On: 12/03/2023 09:52      IMPRESSION/PLAN: 1. 68 y.o. gentleman with Stage T1c adenocarcinoma of the prostate with Gleason Score of 3+3, and PSA of 4.82. We discussed the patient's workup and  outlined the nature of prostate cancer in this setting. The patient's T stage, Gleason's score, and PSA put him into the low risk group. Accordingly, he is eligible for a variety of potential treatment options including active surveillance, brachytherapy, 5.5 weeks of external radiation, or prostatectomy. We discussed the available radiation techniques, and focused on the details and logistics of delivery. We discussed and outlined the risks, benefits, short and long-term effects associated with radiotherapy and compared and contrasted these with prostatectomy. We discussed the role of SpaceOAR gel in reducing the rectal toxicity associated with radiotherapy. He appears to have a good understanding of his disease and our treatment recommendations which are of curative intent.  He was encouraged to ask questions that were answered to his stated satisfaction.  At the conclusion of our conversation, the patient is interested in moving forward with ***.  We personally spent *** minutes in this encounter including chart review, reviewing radiological studies, meeting face-to-face with the patient, entering orders and completing documentation.    Marguarite Arbour, PA-C    Margaretmary Dys, MD  [facility]  Radiation Oncology Direct Dial: [phone]  Fax: [phone] [email or web address]  Skype  LinkedIn   This document serves as a record of services personally performed by Margaretmary Dys, MD and Marcello Fennel, PA-C. It was created on their behalf by Mickie Bail, a trained medical scribe. The creation of this record is based on the scribe's personal observations and the provider's statements to them. This document has been checked and approved by the attending provider.

## 2023-12-07 ENCOUNTER — Ambulatory Visit
Admission: RE | Admit: 2023-12-07 | Discharge: 2023-12-07 | Disposition: A | Discharge status: Home / Self Care | Source: Ambulatory Visit | Attending: Radiation Oncology

## 2023-12-07 ENCOUNTER — Ambulatory Visit
Admission: RE | Admit: 2023-12-07 | Discharge: 2023-12-07 | Disposition: A | Discharge status: Home / Self Care | Source: Ambulatory Visit | Attending: Radiation Oncology | Admitting: Radiation Oncology

## 2023-12-07 ENCOUNTER — Encounter: Payer: Self-pay | Admitting: Radiation Oncology

## 2023-12-07 VITALS — BP 140/74 | HR 77 | Temp 98.3°F | Resp 18 | Ht 67.0 in | Wt 165.4 lb

## 2023-12-07 DIAGNOSIS — Z803 Family history of malignant neoplasm of breast: Secondary | ICD-10-CM | POA: Diagnosis not present

## 2023-12-07 DIAGNOSIS — E7800 Pure hypercholesterolemia, unspecified: Secondary | ICD-10-CM | POA: Diagnosis not present

## 2023-12-07 DIAGNOSIS — Z79899 Other long term (current) drug therapy: Secondary | ICD-10-CM | POA: Insufficient documentation

## 2023-12-07 DIAGNOSIS — Z7982 Long term (current) use of aspirin: Secondary | ICD-10-CM | POA: Diagnosis not present

## 2023-12-07 DIAGNOSIS — Z87891 Personal history of nicotine dependence: Secondary | ICD-10-CM | POA: Insufficient documentation

## 2023-12-07 DIAGNOSIS — G4730 Sleep apnea, unspecified: Secondary | ICD-10-CM | POA: Diagnosis not present

## 2023-12-07 DIAGNOSIS — C61 Malignant neoplasm of prostate: Secondary | ICD-10-CM | POA: Insufficient documentation

## 2023-12-07 DIAGNOSIS — Z791 Long term (current) use of non-steroidal anti-inflammatories (NSAID): Secondary | ICD-10-CM | POA: Diagnosis not present

## 2023-12-07 DIAGNOSIS — I10 Essential (primary) hypertension: Secondary | ICD-10-CM | POA: Diagnosis not present

## 2023-12-07 DIAGNOSIS — I2510 Atherosclerotic heart disease of native coronary artery without angina pectoris: Secondary | ICD-10-CM | POA: Diagnosis not present

## 2023-12-07 DIAGNOSIS — J4489 Other specified chronic obstructive pulmonary disease: Secondary | ICD-10-CM | POA: Diagnosis not present

## 2023-12-07 DIAGNOSIS — Z191 Hormone sensitive malignancy status: Secondary | ICD-10-CM | POA: Diagnosis not present

## 2023-12-07 HISTORY — DX: Urgency of urination: R39.15

## 2023-12-07 HISTORY — DX: Male erectile dysfunction, unspecified: N52.9

## 2023-12-07 HISTORY — DX: Pure hypercholesterolemia, unspecified: E78.00

## 2023-12-07 HISTORY — DX: Benign prostatic hyperplasia with lower urinary tract symptoms: N40.1

## 2023-12-07 HISTORY — DX: Elevated prostate specific antigen (PSA): R97.20

## 2023-12-07 HISTORY — DX: Nocturia: R35.1

## 2023-12-07 NOTE — Progress Notes (Signed)
 Introduced myself to the patient as the prostate nurse navigator.  He is here to discuss his radiation treatment options and will proceed with 5.5 weeks of daily radiation.  I gave him my business card and asked him to call me with questions or concerns.  Verbalized understanding.

## 2023-12-08 ENCOUNTER — Encounter: Payer: Self-pay | Admitting: Urology

## 2023-12-09 DIAGNOSIS — R972 Elevated prostate specific antigen [PSA]: Secondary | ICD-10-CM | POA: Diagnosis not present

## 2023-12-09 DIAGNOSIS — C61 Malignant neoplasm of prostate: Secondary | ICD-10-CM | POA: Diagnosis not present

## 2023-12-09 DIAGNOSIS — R1084 Generalized abdominal pain: Secondary | ICD-10-CM | POA: Diagnosis not present

## 2023-12-09 DIAGNOSIS — K920 Hematemesis: Secondary | ICD-10-CM | POA: Diagnosis not present

## 2023-12-09 DIAGNOSIS — K921 Melena: Secondary | ICD-10-CM | POA: Diagnosis not present

## 2023-12-09 DIAGNOSIS — K2101 Gastro-esophageal reflux disease with esophagitis, with bleeding: Secondary | ICD-10-CM | POA: Diagnosis not present

## 2023-12-14 ENCOUNTER — Encounter (HOSPITAL_BASED_OUTPATIENT_CLINIC_OR_DEPARTMENT_OTHER): Payer: 59

## 2023-12-16 NOTE — Progress Notes (Signed)
 RN spoke with patient to review that we are awaiting date for fiducial marker's and spaceOAR prior to CT simulation.  Verbalized understanding, no additional needs at this time.

## 2023-12-27 ENCOUNTER — Other Ambulatory Visit: Payer: Self-pay | Admitting: Urology

## 2023-12-29 ENCOUNTER — Telehealth: Payer: Self-pay | Admitting: Urology

## 2023-12-29 NOTE — Telephone Encounter (Signed)
error 

## 2023-12-31 NOTE — Progress Notes (Signed)
 RN spoke with patient to review next steps with fiducial's, spaceOAR, and CT Simulation.  All questions answered.  Patient requested information mailed and a mychart message.  RN will provide.  No additional needs at this time.

## 2024-01-03 ENCOUNTER — Ambulatory Visit (HOSPITAL_BASED_OUTPATIENT_CLINIC_OR_DEPARTMENT_OTHER): Admitting: Internal Medicine

## 2024-01-03 DIAGNOSIS — J449 Chronic obstructive pulmonary disease, unspecified: Secondary | ICD-10-CM | POA: Diagnosis not present

## 2024-01-03 LAB — PULMONARY FUNCTION TEST
DL/VA % pred: 102 %
DL/VA: 4.29 ml/min/mmHg/L
DLCO unc % pred: 81 %
DLCO unc: 19.93 ml/min/mmHg
FEF 25-75 Post: 0.79 L/s
FEF 25-75 Pre: 0.83 L/s
FEF2575-%Change-Post: -5 %
FEF2575-%Pred-Post: 32 %
FEF2575-%Pred-Pre: 34 %
FEV1-%Change-Post: 8 %
FEV1-%Pred-Post: 58 %
FEV1-%Pred-Pre: 53 %
FEV1-Post: 1.8 L
FEV1-Pre: 1.65 L
FEV1FVC-%Change-Post: 2 %
FEV1FVC-%Pred-Pre: 72 %
FEV6-%Change-Post: 1 %
FEV6-%Pred-Post: 77 %
FEV6-%Pred-Pre: 76 %
FEV6-Post: 3 L
FEV6-Pre: 2.96 L
FEV6FVC-%Change-Post: -4 %
FEV6FVC-%Pred-Post: 97 %
FEV6FVC-%Pred-Pre: 102 %
FVC-%Change-Post: 5 %
FVC-%Pred-Post: 78 %
FVC-%Pred-Pre: 74 %
FVC-Post: 3.25 L
FVC-Pre: 3.06 L
Post FEV1/FVC ratio: 55 %
Post FEV6/FVC ratio: 92 %
Pre FEV1/FVC ratio: 54 %
Pre FEV6/FVC Ratio: 97 %
RV % pred: 93 %
RV: 2.04 L
TLC % pred: 81 %
TLC: 5.24 L

## 2024-01-03 NOTE — Patient Instructions (Signed)
 Full PFT performed today.

## 2024-01-03 NOTE — Progress Notes (Signed)
 Full PFT performed today.

## 2024-01-10 ENCOUNTER — Ambulatory Visit (INDEPENDENT_AMBULATORY_CARE_PROVIDER_SITE_OTHER): Payer: 59 | Admitting: Internal Medicine

## 2024-01-10 ENCOUNTER — Encounter: Payer: Self-pay | Admitting: Internal Medicine

## 2024-01-10 VITALS — BP 126/74 | HR 78 | Temp 98.4°F | Ht 67.0 in | Wt 167.8 lb

## 2024-01-10 DIAGNOSIS — J449 Chronic obstructive pulmonary disease, unspecified: Secondary | ICD-10-CM

## 2024-01-10 DIAGNOSIS — G4733 Obstructive sleep apnea (adult) (pediatric): Secondary | ICD-10-CM

## 2024-01-10 DIAGNOSIS — Z87891 Personal history of nicotine dependence: Secondary | ICD-10-CM | POA: Diagnosis not present

## 2024-01-10 MED ORDER — ALBUTEROL SULFATE HFA 108 (90 BASE) MCG/ACT IN AERS: INHALATION_SPRAY | RESPIRATORY_TRACT | 12 refills | Status: DC

## 2024-01-10 MED ORDER — ALBUTEROL SULFATE (2.5 MG/3ML) 0.083% IN NEBU: 2.5000 mg | INHALATION_SOLUTION | Freq: Four times a day (QID) | RESPIRATORY_TRACT | 3 refills | Status: DC | PRN

## 2024-01-10 MED ORDER — TRELEGY ELLIPTA 200-62.5-25 MCG/ACT IN AEPB: INHALATION_SPRAY | RESPIRATORY_TRACT | 4 refills | Status: DC

## 2024-01-10 NOTE — Progress Notes (Signed)
 HPI M former smoker (20pkyrs) followed for OSA, complicated by COPD, HTN, GERD, Cervical Radiculopathy, Depression, Lung Nodule,  CAD,  HST 01/26/21- AHI 9.3/ hr, desaturation to 86%, body weight 187 lbs CPAP auto 5-15/ [employer]   ordered 06/27/21 PFT 04/30/21-minimal obstruction, insignif resp to BD  FEV1/FVC 0.75 PFT 01/03/24- moderately severe COPD w/o resp to BD. Nl lung volumes and DLCO. ===========================================================  05/17/23-65 yoM Smoker (20pkyrs) followed for OSA, Lung Nodule, COPD, Tobacco Use, complicated by  HTN, GERD, Cervical Radiculopathy, Depression, CAD,  -Trelegy100, Neb Duoneb, Ventolin  hfa Body weight today- CPAP  auto 5-15/[employer]     LUNA machine dc'd by patient Download- compliance  0%, AHI 2.2/ hr LOV Walsh, NP 03/31/23-added sleep aide clonazepam  0.5 mg to improve compliance, pred taper and abx for COPD, advised contact PCP for help with anxiety,. Download reviewed.  He is not using CPAP but admits he does not like it.  He has baseline AHI was only 9/h which is mild enough that medical need for treatment is not compelling.  He is going to keep his weight down and watch for now. Again discussed importance of smoking cessation and encouraged him to try. We will renew screening CT program.  Lung nodule is acting benign without change in 2 years. He describes morning cough and wakes with an itching sensation in his chest.  We are going to try increasing to Trelegy 200. CT chest 03/25/23 IMPRESSION: 1. Irregular solid 1.0 cm posterior right upper lobe pulmonary nodule, not substantially changed since baseline 06/19/2021 chest CT study, presumably benign. Recommend resuming screening chest CT program in 12 months. 2. One vessel coronary atherosclerosis. 3. Aortic Atherosclerosis (ICD10-I70.0) and Emphysema (ICD10-J43.9). CXR 03/31/23- IMPRESSION: Peribronchial thickening suggests bronchitis or interstitial pneumonia. There is no focal pulmonary  consolidation.   11/18/23- 67 yoM Smoker (20pkyrs) followed for OSA(quit CPAP), Lung Nodule, COPD, Tobacco Use, complicated by  HTN, GERD, Cervical Radiculopathy, Depression, CAD,  -Trelegy200, Neb Duoneb, Ventolin  hfa Body weight today-164 lbs    Weighed 176 in 2023 CPAP  auto 5-15/[employer]     LUNA machine ([employer] for download) PCP has addressed Insomnia with sleep hygiene counseling. Using CPAP about 1x/ week- no download. Sleeps better with it, but only uses it when breathing is otherwise good so he won't have to take it of to cough. Discussed. Using rescue inhaler 5x/day and also Trelegy 200. Uses nebulizer once on most days. Still smoking. Now about 3 cigs/ day- discussed. Due for next screening CT in August. Denies hemoptysis, but says he gets up in AM sometimes nauseated, vomits up some blood. Denies black stools, but says stools often watery. Instructed to discuss these issues with his PCP soon   01/10/23- 67 yoM Smoker (20pkyrs) followed for OSA(quit CPAP), Lung Nodule, COPD, Tobacco Use, complicated by  Prostate cancer, HTN, GERD, Cervical Radiculopathy, Depression, CAD,  -Trelegy200, Neb Duoneb, Ventolin  hfa Body weight today-167 lbs    Weighed 176 in 2023 CPAP  auto 5-15/[employer]     LUNA machine ([employer] for download) PCP has addressed Insomnia with sleep hygiene counseling. Download compliance 14 minutes, AHI 1.9/hr  Download reviewed. Goals, purpose and alternatives to CPAP discussed again. If he isn't going to use it more, then not worth continuing for mild OSA. Smoking cessation counseled. CXR 12/03/23 MPRESSION: Emphysema without evidence of acute cardiopulmonary disease PFT 01/03/24- moderately severe COPD w/o resp to BD. Nl lung volumes and DLCO. Discussed the use of AI scribe software for clinical note transcription with the patient, who gave  verbal consent to proceed.  History of Present Illness   The patient, with a history of lung nodule, smoking and COPD,  presents for a sleep study referral. He reports no issues with sleep, stating he does not nap during the day and has no problems falling asleep at night. He denies any symptoms of sleep apnea, such as holding his breath or stopping breathing during sleep. The patient's wife also has not noticed any such symptoms. The patient does use oxygen at night due to his COPD, He also had a recent hospitalization for an artery spasm and heart issues, during which he was monitored for four days without oxygen. 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 He consumes coffee in the morning and sweet tea throughout the day, but denies needing caffeine to stay awake.     Assessment and Plan:    COPD with acute exacerbation COPD exacerbations potentially related to radiation therapy for lung cancer. No acute exacerbation during the visit. - Continue CT low dose screen -refill inhalers as needed    Tobacco Abuse -counseling   Obstructive sleep apnea- mild  AHI only 9/hr. He denies symptoms and not motivated to use CPAP -Ok to DC CPAP use  Prostate cancer Lung cancer treated with radiation and chemotherapy. No current signs of cancer recurrence. - Continue regular follow-ups with oncologist for cancer surveillance.  Goals of Care Discussed potential need for CPAP if sleep apnea is diagnosed. Explained CPAP is common for medically significant sleep apnea, but comfort and tolerance are crucial. - Discuss CPAP therapy if sleep apnea is diagnosed and deemed medically significant.  Follow-up Plan follow-up based on home sleep test results, which will evaluate for sleep apnea and assess oxygen levels during sleep without supplemental oxygen. - Order home sleep test to evaluate for sleep apnea. - Instruct to call the office two weeks after the sleep test for results if not contacted. - Schedule follow-up appointment based on sleep  test results.        ROS-see HPI   + = positive Constitutional:    weight loss, night sweats, fevers, chills, fatigue, lassitude. HEENT:    headaches, difficulty swallowing, tooth/dental problems, sore throat,       Sneezing, itching, ear ache, nasal congestion, post nasal drip, snoring CV:    chest pain, orthopnea, PND, swelling in lower extremities, anasarca,                                   dizziness, palpitations Resp:   +shortness of breath with exertion or at rest.                productive cough,   non-productive cough, coughing up of blood.              change in color of mucus.  wheezing.   Skin:    rash or lesions. GI:  + heartburn, indigestion, abdominal pain, nausea, +vomiting, +diarrhea,                 +change in bowel habits, loss of appetite GU: dysuria, change in color of urine, no urgency or frequency.   flank pain. MS:   joint pain, stiffness, decreased range of motion, back pain. Neuro-     nothing unusual Psych:  change in mood or affect.  depression or anxiety.  memory loss.  OBJ- Physical Exam General- Alert, Oriented, Affect-appropriate, Distress- none acute, +slender Skin- rash-none, lesions- none, excoriation- none Lymphadenopathy- none Head- atraumatic            Eyes- Gross vision intact, PERRLA, conjunctivae and secretions clear            Ears- Hearing, canals-normal            Nose- Clear, no-Septal dev, mucus, polyps, erosion, perforation             Throat- Mallampati III , mucosa clear , drainage- none, tonsils- atrophic,  + teeth Neck- flexible , trachea midline, no stridor , thyroid  nl, carotid no bruit Chest - symmetrical excursion , unlabored           Heart/CV- RRR , no murmur , no gallop  , no rub, nl s1 s2                           - JVD- none , edema- none, stasis changes- none, varices- none           Lung-  wheeze-none, cough- none , dullness-none, rub- none           Chest wall-  Abd-  Br/ Gen/ Rectal- Not done, not  indicated Extrem- cyanosis- none, clubbing, none, atrophy- none, strength- nl Neuro- grossly intact to observation

## 2024-01-10 NOTE — Patient Instructions (Addendum)
 Inhalers refilled  The CPAP isn't making much medical difference for your mild sleep apnea. There is no harm in using it occasionally if you want.  Please let us  know if we can help  Return in one year.

## 2024-01-18 DIAGNOSIS — K2101 Gastro-esophageal reflux disease with esophagitis, with bleeding: Secondary | ICD-10-CM | POA: Diagnosis not present

## 2024-01-18 DIAGNOSIS — I10 Essential (primary) hypertension: Secondary | ICD-10-CM | POA: Diagnosis not present

## 2024-01-18 DIAGNOSIS — I25118 Atherosclerotic heart disease of native coronary artery with other forms of angina pectoris: Secondary | ICD-10-CM | POA: Diagnosis not present

## 2024-01-18 DIAGNOSIS — C61 Malignant neoplasm of prostate: Secondary | ICD-10-CM | POA: Diagnosis not present

## 2024-01-18 DIAGNOSIS — R972 Elevated prostate specific antigen [PSA]: Secondary | ICD-10-CM | POA: Diagnosis not present

## 2024-01-18 DIAGNOSIS — R7303 Prediabetes: Secondary | ICD-10-CM | POA: Diagnosis not present

## 2024-01-19 ENCOUNTER — Other Ambulatory Visit: Payer: Self-pay | Admitting: Urology

## 2024-01-19 DIAGNOSIS — C61 Malignant neoplasm of prostate: Secondary | ICD-10-CM

## 2024-01-23 ENCOUNTER — Encounter: Payer: Self-pay | Admitting: Internal Medicine

## 2024-01-25 DIAGNOSIS — Z1339 Encounter for screening examination for other mental health and behavioral disorders: Secondary | ICD-10-CM | POA: Diagnosis not present

## 2024-01-25 DIAGNOSIS — G4733 Obstructive sleep apnea (adult) (pediatric): Secondary | ICD-10-CM | POA: Diagnosis not present

## 2024-01-25 DIAGNOSIS — Z0000 Encounter for general adult medical examination without abnormal findings: Secondary | ICD-10-CM | POA: Diagnosis not present

## 2024-01-25 DIAGNOSIS — I10 Essential (primary) hypertension: Secondary | ICD-10-CM | POA: Diagnosis not present

## 2024-01-25 DIAGNOSIS — C61 Malignant neoplasm of prostate: Secondary | ICD-10-CM | POA: Diagnosis not present

## 2024-01-25 DIAGNOSIS — F331 Major depressive disorder, recurrent, moderate: Secondary | ICD-10-CM | POA: Diagnosis not present

## 2024-01-25 DIAGNOSIS — R82998 Other abnormal findings in urine: Secondary | ICD-10-CM | POA: Diagnosis not present

## 2024-01-25 DIAGNOSIS — I700 Atherosclerosis of aorta: Secondary | ICD-10-CM | POA: Diagnosis not present

## 2024-01-25 DIAGNOSIS — Z720 Tobacco use: Secondary | ICD-10-CM | POA: Diagnosis not present

## 2024-01-25 DIAGNOSIS — Z1331 Encounter for screening for depression: Secondary | ICD-10-CM | POA: Diagnosis not present

## 2024-01-25 DIAGNOSIS — G9589 Other specified diseases of spinal cord: Secondary | ICD-10-CM | POA: Diagnosis not present

## 2024-01-25 DIAGNOSIS — J449 Chronic obstructive pulmonary disease, unspecified: Secondary | ICD-10-CM | POA: Diagnosis not present

## 2024-01-25 DIAGNOSIS — I25118 Atherosclerotic heart disease of native coronary artery with other forms of angina pectoris: Secondary | ICD-10-CM | POA: Diagnosis not present

## 2024-01-31 ENCOUNTER — Ambulatory Visit (HOSPITAL_COMMUNITY)
Admission: RE | Admit: 2024-01-31 | Discharge: 2024-01-31 | Disposition: A | Discharge status: Home / Self Care | Source: Ambulatory Visit | Attending: Urology | Admitting: Urology

## 2024-01-31 DIAGNOSIS — C61 Malignant neoplasm of prostate: Secondary | ICD-10-CM | POA: Diagnosis not present

## 2024-03-14 ENCOUNTER — Encounter (HOSPITAL_COMMUNITY): Payer: Self-pay | Admitting: Urology

## 2024-03-14 ENCOUNTER — Other Ambulatory Visit: Payer: Self-pay | Admitting: Urology

## 2024-03-14 DIAGNOSIS — C61 Malignant neoplasm of prostate: Secondary | ICD-10-CM

## 2024-03-14 NOTE — Progress Notes (Signed)
 Spoke w/ via phone for pre-op interview--- Joshua Stephenson needs dos----   BMP per anesthesia      Stephenson results------Current EKG in Epic dated 07/26/23. COVID test -----patient states asymptomatic no test needed Arrive at -------0830 NPO after MN NO Solid Food.  Clear liquids from MN until---0730 Pre-Surgery Ensure or G2:  Med rec completed Medications to take morning of surgery ----- Albuterol -bring. Lexapro , Trellegy Ellipta, Gabapentin , Metoprolol , and Protonix . Diabetic medication -----  GLP1 agonist last dose: GLP1 instructions:  Patient instructed no nail polish to be worn day of surgery Patient instructed to bring photo id and insurance card day of surgery Patient aware to have Driver (ride ) / caregiver    for 24 hours after surgery - Fiance Joshua Stephenson Patient Special Instructions ----- Shower with antibacterial soap. Fleet enema per surgeons instructions. Pre-Op special Instructions -----  Patient verbalized understanding of instructions that were given at this phone interview. Patient denies chest pain, sob, fever, cough at the interview.

## 2024-03-16 DIAGNOSIS — J441 Chronic obstructive pulmonary disease with (acute) exacerbation: Secondary | ICD-10-CM | POA: Diagnosis not present

## 2024-03-16 DIAGNOSIS — R0600 Dyspnea, unspecified: Secondary | ICD-10-CM | POA: Diagnosis not present

## 2024-03-17 ENCOUNTER — Encounter (HOSPITAL_COMMUNITY)
Admission: RE | Disposition: A | Discharge status: Home / Self Care | Payer: Self-pay | Source: Home / Self Care | Attending: Urology

## 2024-03-17 ENCOUNTER — Ambulatory Visit (HOSPITAL_COMMUNITY)
Admission: RE | Admit: 2024-03-17 | Discharge: 2024-03-17 | Disposition: A | Discharge status: Home / Self Care | Attending: Urology | Admitting: Urology

## 2024-03-17 ENCOUNTER — Encounter (HOSPITAL_COMMUNITY): Payer: Self-pay | Admitting: Urology

## 2024-03-17 ENCOUNTER — Ambulatory Visit (HOSPITAL_COMMUNITY): Admitting: Anesthesiology

## 2024-03-17 ENCOUNTER — Ambulatory Visit (HOSPITAL_BASED_OUTPATIENT_CLINIC_OR_DEPARTMENT_OTHER): Admitting: Anesthesiology

## 2024-03-17 DIAGNOSIS — I2510 Atherosclerotic heart disease of native coronary artery without angina pectoris: Secondary | ICD-10-CM

## 2024-03-17 DIAGNOSIS — J4489 Other specified chronic obstructive pulmonary disease: Secondary | ICD-10-CM | POA: Diagnosis not present

## 2024-03-17 DIAGNOSIS — K219 Gastro-esophageal reflux disease without esophagitis: Secondary | ICD-10-CM | POA: Insufficient documentation

## 2024-03-17 DIAGNOSIS — I700 Atherosclerosis of aorta: Secondary | ICD-10-CM | POA: Insufficient documentation

## 2024-03-17 DIAGNOSIS — I10 Essential (primary) hypertension: Secondary | ICD-10-CM

## 2024-03-17 DIAGNOSIS — Z87891 Personal history of nicotine dependence: Secondary | ICD-10-CM | POA: Insufficient documentation

## 2024-03-17 DIAGNOSIS — C61 Malignant neoplasm of prostate: Secondary | ICD-10-CM | POA: Diagnosis not present

## 2024-03-17 DIAGNOSIS — G4730 Sleep apnea, unspecified: Secondary | ICD-10-CM | POA: Insufficient documentation

## 2024-03-17 HISTORY — PX: SPACE OAR INSTILLATION: SHX6769

## 2024-03-17 HISTORY — PX: GOLD SEED IMPLANT: SHX6343

## 2024-03-17 LAB — BASIC METABOLIC PANEL WITH GFR
Anion gap: 10 (ref 5–15)
BUN: 14 mg/dL (ref 8–23)
CO2: 22 mmol/L (ref 22–32)
Calcium: 9.7 mg/dL (ref 8.9–10.3)
Chloride: 105 mmol/L (ref 98–111)
Creatinine, Ser: 1.16 mg/dL (ref 0.61–1.24)
GFR, Estimated: >60 mL/min (ref >60)
Glucose, Bld: 108 mg/dL — ABNORMAL HIGH (ref 70–99)
Potassium: 3.9 mmol/L (ref 3.5–5.1)
Sodium: 137 mmol/L (ref 135–145)

## 2024-03-17 SURGERY — INSERTION, GOLD SEEDS
Anesthesia: Monitor Anesthesia Care | Site: Prostate

## 2024-03-17 MED ORDER — MIDAZOLAM HCL 2 MG/2ML IJ SOLN
INTRAMUSCULAR | Status: DC | PRN
  Administered 2024-03-17: 2 mg via INTRAVENOUS

## 2024-03-17 MED ORDER — CHLORHEXIDINE GLUCONATE 0.12 % MT SOLN
15.0000 mL | Freq: Once | OROMUCOSAL | Status: AC
  Administered 2024-03-17: 15 mL via OROMUCOSAL

## 2024-03-17 MED ORDER — SODIUM CHLORIDE (PF) 0.9 % IJ SOLN
INTRAMUSCULAR | Status: DC | PRN
  Administered 2024-03-17: 10 mL

## 2024-03-17 MED ORDER — LIDOCAINE HCL 2 % IJ SOLN
INTRAMUSCULAR | Status: DC | PRN
  Administered 2024-03-17: 10 mL

## 2024-03-17 MED ORDER — FLEET ENEMA RE ENEM
1.0000 | ENEMA | Freq: Once | RECTAL | Status: DC
  Filled 2024-03-17: qty 1

## 2024-03-17 MED ORDER — MIDAZOLAM HCL 2 MG/2ML IJ SOLN
INTRAMUSCULAR | Status: AC
  Filled 2024-03-17: qty 2

## 2024-03-17 MED ORDER — FLEET ENEMA RE ENEM
ENEMA | RECTAL | Status: AC
  Filled 2024-03-17: qty 1

## 2024-03-17 MED ORDER — LIDOCAINE HCL 2 % IJ SOLN
INTRAMUSCULAR | Status: AC
  Filled 2024-03-17: qty 20

## 2024-03-17 MED ORDER — PROPOFOL 10 MG/ML IV BOLUS
INTRAVENOUS | Status: DC | PRN
  Administered 2024-03-17: 30 mg via INTRAVENOUS

## 2024-03-17 MED ORDER — LIDOCAINE 2% (20 MG/ML) 5 ML SYRINGE
INTRAMUSCULAR | Status: DC | PRN
  Administered 2024-03-17: 60 mg via INTRAVENOUS

## 2024-03-17 MED ORDER — CEFAZOLIN SODIUM-DEXTROSE 2-4 GM/100ML-% IV SOLN
2.0000 g | INTRAVENOUS | Status: AC
  Administered 2024-03-17: 2 g via INTRAVENOUS

## 2024-03-17 MED ORDER — ORAL CARE MOUTH RINSE: 15.0000 mL | Freq: Once | OROMUCOSAL | Status: AC

## 2024-03-17 MED ORDER — SODIUM CHLORIDE (PF) 0.9 % IJ SOLN
INTRAMUSCULAR | Status: AC
  Filled 2024-03-17: qty 10

## 2024-03-17 MED ORDER — CEFAZOLIN SODIUM-DEXTROSE 2-4 GM/100ML-% IV SOLN
INTRAVENOUS | Status: AC
  Filled 2024-03-17: qty 100

## 2024-03-17 MED ORDER — LACTATED RINGERS IV SOLN: INTRAVENOUS | Status: DC

## 2024-03-17 MED ORDER — CHLORHEXIDINE GLUCONATE 0.12 % MT SOLN
OROMUCOSAL | Status: DC
Start: 2024-03-17 — End: 2024-03-17
  Filled 2024-03-17: qty 15

## 2024-03-17 MED ORDER — PROPOFOL 500 MG/50ML IV EMUL
INTRAVENOUS | Status: DC | PRN
  Administered 2024-03-17: 200 ug/kg/min via INTRAVENOUS

## 2024-03-17 SURGICAL SUPPLY — 21 items
BENZOIN TINCTURE PRP APPL 2/3 (GAUZE/BANDAGES/DRESSINGS) IMPLANT
BLADE CLIPPER SENSICLIP SURGIC (BLADE) ×1 IMPLANT
CNTNR URN SCR LID CUP LEK RST (MISCELLANEOUS) ×1 IMPLANT
COVER BACK TABLE 60X90IN (DRAPES) ×1 IMPLANT
DRSG TEGADERM 4X4.75 (GAUZE/BANDAGES/DRESSINGS) ×1 IMPLANT
DRSG TEGADERM 8X12 (GAUZE/BANDAGES/DRESSINGS) ×1 IMPLANT
GAUZE SPONGE 4X4 12PLY STRL (GAUZE/BANDAGES/DRESSINGS) ×1 IMPLANT
GLOVE BIO SURGEON STRL SZ7 (GLOVE) ×1 IMPLANT
IMPL SPACEOAR SYSTEM 10ML (Spacer) IMPLANT
MARKER GOLD PRELOAD 1.2X3 (Urological Implant) ×1 IMPLANT
MARKER SKIN DUAL TIP RULER LAB (MISCELLANEOUS) ×1 IMPLANT
NDL SPNL 22GX3.5 QUINCKE BK (NEEDLE) IMPLANT
NEEDLE SPNL 22GX3.5 QUINCKE BK (NEEDLE) ×1 IMPLANT
SHEATH ULTRASOUND LF (SHEATH) IMPLANT
SHEATH ULTRASOUND LTX NONSTRL (SHEATH) IMPLANT
SLEEVE SCD COMPRESS KNEE MED (STOCKING) ×1 IMPLANT
SURGILUBE 2OZ TUBE FLIPTOP (MISCELLANEOUS) ×1 IMPLANT
SYR 10ML LL (SYRINGE) IMPLANT
SYR CONTROL 10ML LL (SYRINGE) ×1 IMPLANT
TOWEL OR 17X24 6PK STRL BLUE (TOWEL DISPOSABLE) ×1 IMPLANT
UNDERPAD 30X36 HEAVY ABSORB (UNDERPADS AND DIAPERS) ×1 IMPLANT

## 2024-03-17 NOTE — Transfer of Care (Signed)
 Immediate Anesthesia Transfer of Care Note  Patient: Joshua Stephenson  Procedure(s) Performed: INSERTION, GOLD SEEDS (Prostate) INJECTION, HYDROGEL SPACER (Prostate)  Patient Location: PACU  Anesthesia Type:MAC  Level of Consciousness: sedated and drowsy  Airway & Oxygen Therapy: Patient Spontanous Breathing  Post-op Assessment: Report given to RN  Post vital signs: Reviewed and stable  Last Vitals:  Vitals Value Taken Time  BP    Temp    Pulse 67 03/17/24 11:14  Resp 21 03/17/24 11:14  SpO2 100 % 03/17/24 11:14  Vitals shown include unfiled device data.  Last Pain:  Vitals:   03/17/24 0912  TempSrc: Oral  PainSc: 0-No pain      Patients Stated Pain Goal: 5 (03/17/24 0912)  Complications: No notable events documented.

## 2024-03-17 NOTE — Op Note (Signed)
 Preoperative diagnosis: Clinically localized adenocarcinoma of the prostate   Postoperative diagnosis: Clinically localized adenocarcinoma of the prostate  Procedure: 1) Placement of fiducial markers into prostate                    2) Insertion of SpaceOAR hydrogel   Surgeon:Luke Lindsee Labarre. M.D.  Anesthesia: General  EBL: Minimal  Complications: None  Indication: Joshua Stephenson is a 68 y.o. gentleman with clinically localized prostate cancer. After discussing management options for treatment, he elected to proceed with radiotherapy. He presents today for the above procedures. The potential risks, complications, alternative options, and expected recovery course have been discussed in detail with the patient and he has provided informed consent to proceed.  Description of procedure: The patient was administered preoperative antibiotics, placed in the dorsal lithotomy position, and prepped and draped in the usual sterile fashion. Next, transrectal ultrasonography was utilized to visualize the prostate. Three gold fiducial markers were then placed into the prostate via transperineal needles under ultrasound guidance at the left apex, left base, and right mid gland under direct ultrasound guidance. A site in the midline was then selected on the perineum for placement of an 18 g needle with saline. The needle was advanced above the rectum and below Denonvillier's fascia to the mid gland and confirmed to be in the midline on transverse imaging. One cc of saline was injected confirming appropriate expansion of this space. A total of 5 cc of saline was then injected to open the space further bilaterally. The saline syringe was then removed and the SpaceOAR hydrogel was injected with good distribution bilaterally. He tolerated the procedure well and without complications. He was given a voiding trial prior to discharge from the PACU.

## 2024-03-17 NOTE — H&P (Signed)
 H&P  History of Present Illness: Joshua Stephenson is a 68 y.o. 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   Coronary artery calcification    Depression    Dyspnea    ED (erectile dysfunction)    Elevated PSA    GERD (gastroesophageal reflux disease)    Hemorrhoids    Hx of adenomatous colonic polyps 07/08/2021   2 diminutive - repeat exam 2029   Hypercholesterolemia    Hypertension    Nocturia    Sleep apnea    in process of obtaining cpap   Urinary urgency     Past Surgical History:  Procedure Laterality Date   ANTERIOR CERVICAL DECOMP/DISCECTOMY FUSION N/A 08/07/2021   Procedure: Anterior Cervical Decompression/discectomy Fusion Cervical four five, Cervical five six, Cervical six seven;  Surgeon: Lanis Pupa, MD;  Location: [facility] OR;  Service: Neurosurgery;  Laterality: N/A;  3C   COLONOSCOPY  2011   PROSTATE BIOPSY     SHOULDER ARTHROSCOPY W/ ROTATOR CUFF REPAIR Bilateral 02/08/2018    Home Medications:  No outpatient medications have been marked as taking for the 03/17/24 encounter [facility] Encounter).    Allergies: No Known Allergies  Family History  Problem Relation Age of Onset   Breast cancer Mother    Hypertension Mother    Diabetes Father    Heart disease Father    Hypertension Father    Cancer Sister        type unknown   Breast cancer Sister    Cancer Brother        oral cancer    Cancer Brother        oral cancer    Breast cancer Niece    Breast cancer Niece    Colon cancer Neg Hx    Esophageal cancer Neg Hx    Rectal cancer Neg Hx    Stomach cancer Neg Hx    Colon polyps Neg Hx     Social History:  reports that he quit smoking about 23 years ago. His smoking use included cigarettes. He has a 5 pack-year  smoking history. He has been exposed to tobacco smoke. He has never used smokeless tobacco. He reports that he does not currently use alcohol . He reports that he does not use drugs.  ROS: A complete review of systems was performed.  All systems are negative except for pertinent findings as noted.  Physical Exam:  Vital signs in last 24 hours:   Constitutional:  Alert and oriented, No acute distress Cardiovascular: Regular rate and rhythm Respiratory: Normal respiratory effort, Lungs clear bilaterally GI: Abdomen is soft, nontender, nondistended, no abdominal masses Lymphatic: No lymphadenopathy Neurologic: Grossly intact, no focal deficits Psychiatric: Normal mood and affect   Laboratory Data:  No results for input(s): WBC, HGB, HCT, PLT in the last 72 hours.  No results for input(s): NA, K, CL, GLUCOSE, BUN, CALCIUM , CREATININE in the last 72 hours.  Invalid input(s): CO3   No results found for this or any previous visit (from the past 24 hours). No results found for this or any previous visit (from the past 240 hours).  Renal Function: No results for input(s): CREATININE in the last 168 hours. CrCl cannot be calculated (Patient's  most recent lab result is older than the maximum 21 days allowed.).  Radiologic Imaging: No results found.  Assessment:  Joshua Stephenson is a 7 y.o. year old M with prostate cancer here today for fiducial markers/spaceOAR  Plan:  To OR as planned. Procedure and risks reviewed (including but not limited bleeding, infection, retention, rectal injury/ulcer, failure to complete procedure)   Herlene Foot, MD 03/17/2024, 8:46 AM  [facility] Pager: [phone]

## 2024-03-17 NOTE — Discharge Instructions (Addendum)
You should avoid strenuous activities today but may resume all normal activities tomorrow.  2.   You can take Tylenol as needed for any pain or discomfort.  3.    Follow up with your radiation oncologist for your simulation appointment as scheduled.  If this is not currently scheduled or you do not know the date/time for that appointment, please contact the radiation oncology office to confirm.    Post Anesthesia Home Care Instructions  Activity: Get plenty of rest for the remainder of the day. A responsible individual must stay with you for 24 hours following the procedure.  For the next 24 hours, DO NOT: -Drive a car -Operate machinery -Drink alcoholic beverages -Take any medication unless instructed by your physician -Make any legal decisions or sign important papers.  Meals: Start with liquid foods such as gelatin or soup. Progress to regular foods as tolerated. Avoid greasy, spicy, heavy foods. If nausea and/or vomiting occur, drink only clear liquids until the nausea and/or vomiting subsides. Call your physician if vomiting continues.  Special Instructions/Symptoms: Your throat may feel dry or sore from the anesthesia or the breathing tube placed in your throat during surgery. If this causes discomfort, gargle with warm salt water. The discomfort should disappear within 24 hours.  

## 2024-03-17 NOTE — Anesthesia Preprocedure Evaluation (Signed)
 Anesthesia Evaluation  Patient identified by MRN, date of birth, ID band Patient awake    Reviewed: Allergy & Precautions, H&P , NPO status , Patient's Chart, lab work & pertinent test results  Airway Mallampati: II   Neck ROM: full    Dental   Pulmonary asthma , sleep apnea , COPD, Patient abstained from smoking., former smoker   breath sounds clear to auscultation       Cardiovascular hypertension, + CAD   Rhythm:regular Rate:Normal     Neuro/Psych  PSYCHIATRIC DISORDERS Anxiety Depression       GI/Hepatic ,GERD  ,,  Endo/Other    Renal/GU    Prostate CA    Musculoskeletal   Abdominal   Peds  Hematology   Anesthesia Other Findings   Reproductive/Obstetrics                             Anesthesia Physical Anesthesia Plan  ASA: 3  Anesthesia Plan: MAC   Post-op Pain Management:    Induction: Intravenous  PONV Risk Score and Plan: 1 and Propofol  infusion, Midazolam  and Treatment may vary due to age or medical condition  Airway Management Planned: Simple Face Mask  Additional Equipment:   Intra-op Plan:   Post-operative Plan:   Informed Consent: I have reviewed the patients History and Physical, chart, labs and discussed the procedure including the risks, benefits and alternatives for the proposed anesthesia with the patient or authorized representative who has indicated his/her understanding and acceptance.     Dental advisory given  Plan Discussed with: CRNA, Anesthesiologist and Surgeon  Anesthesia Plan Comments:        Anesthesia Quick Evaluation

## 2024-03-17 NOTE — Anesthesia Postprocedure Evaluation (Signed)
 Anesthesia Post Note  Patient: Joshua Stephenson  Procedure(s) Performed: INSERTION, GOLD SEEDS (Prostate) INJECTION, HYDROGEL SPACER (Prostate)     Patient location during evaluation: PACU Anesthesia Type: MAC Level of consciousness: awake and alert Pain management: pain level controlled Vital Signs Assessment: post-procedure vital signs reviewed and stable Respiratory status: spontaneous breathing, nonlabored ventilation, respiratory function stable and patient connected to nasal cannula oxygen Cardiovascular status: stable and blood pressure returned to baseline Postop Assessment: no apparent nausea or vomiting Anesthetic complications: no   No notable events documented.  Last Vitals:  Vitals:   03/17/24 1130 03/17/24 1145  BP: 117/85 (!) 141/90  Pulse: 63 67  Resp: 20 (!) 21  Temp:    SpO2: 99% 98%    Last Pain:  Vitals:   03/17/24 1145  TempSrc:   PainSc: 2                  [name]

## 2024-03-20 ENCOUNTER — Encounter (HOSPITAL_COMMUNITY): Payer: Self-pay | Admitting: Urology

## 2024-03-20 ENCOUNTER — Telehealth: Payer: Self-pay | Admitting: *Deleted

## 2024-03-20 NOTE — Telephone Encounter (Signed)
 CALLED PATIENT TO REMIND OF SIM AND MRI FOR 03-21-24- ARRIVAL TIME- 10:45 AM @ [facility] FOR SIM, INFORMED PATIENT TO ARRIVE WITH A FULL BLADDER, AND ALSO HIS MRI- ARRIVAL TIME- 12:45 PM @ [facility], LVM FOR A RETURN CALL

## 2024-03-21 ENCOUNTER — Ambulatory Visit (HOSPITAL_COMMUNITY)
Admission: RE | Admit: 2024-03-21 | Discharge: 2024-03-21 | Disposition: A | Discharge status: Home / Self Care | Source: Ambulatory Visit | Attending: Urology | Admitting: Urology

## 2024-03-21 ENCOUNTER — Ambulatory Visit
Admission: RE | Admit: 2024-03-21 | Discharge: 2024-03-21 | Disposition: A | Discharge status: Home / Self Care | Source: Ambulatory Visit | Attending: Urology | Admitting: Urology

## 2024-03-21 ENCOUNTER — Telehealth: Payer: Self-pay | Admitting: *Deleted

## 2024-03-21 DIAGNOSIS — Z191 Hormone sensitive malignancy status: Secondary | ICD-10-CM | POA: Diagnosis not present

## 2024-03-21 DIAGNOSIS — C61 Malignant neoplasm of prostate: Secondary | ICD-10-CM | POA: Insufficient documentation

## 2024-03-21 DIAGNOSIS — Z510 Encounter for antineoplastic radiation therapy: Secondary | ICD-10-CM | POA: Insufficient documentation

## 2024-03-21 NOTE — Telephone Encounter (Signed)
 CALLED PATIENT TO INQUIRE IF HE IS COMING TODAY FOR HIS SIM AND MRI APPT., LVM FOR A RETURN CALL

## 2024-03-21 NOTE — Progress Notes (Signed)
  Radiation Oncology         [phone]) [phone] ________________________________  Name: Joshua Stephenson MRN: [id number]  Date: 03/21/2024  DOB: 10/27/1955  SIMULATION AND TREATMENT PLANNING NOTE    ICD-10-CM   1. Malignant neoplasm of prostate (HCC)  C61       DIAGNOSIS:   68 y.o. gentleman with Stage T1c adenocarcinoma of the prostate with Gleason score of 3+3, and PSA of 4.82.  NARRATIVE:  The patient was brought to the CT Simulation planning suite.  Identity was confirmed.  All relevant records and images related to the planned course of therapy were reviewed.  The patient freely provided informed written consent to proceed with treatment after reviewing the details related to the planned course of therapy. The consent form was witnessed and verified by the simulation staff.  Then, the patient was set-up in a stable reproducible supine position for radiation therapy.  A vacuum lock pillow device was custom fabricated to position his legs in a reproducible immobilized position.  Then, supervised the performance of a urethrogram under sterile conditions to identify the prostatic apex.  CT images were obtained.  Surface markings were placed.  The CT images were loaded into the planning software.  Then the prostate target and avoidance structures including the rectum, bladder, bowel and hips were contoured.  Treatment planning then occurred.  The radiation prescription was entered and confirmed.  A total of one complex treatment devices was fabricated. I have requested : Intensity Modulated Radiotherapy (IMRT) is medically necessary for this case for the following reason:  Rectal sparing.  I have requested daily cone beam CT volumetric image gudiance to track gold fiducial posiitoning along with bladder and rectal filling, this is medically necessary to assure accurate positioning of high dose radiation.  PLAN:  The patient will receive 70 Gy in 28  fractions.  ________________________________  Donnice FELIX Patrcia, M.D.

## 2024-03-22 DIAGNOSIS — C61 Malignant neoplasm of prostate: Secondary | ICD-10-CM | POA: Diagnosis not present

## 2024-03-22 DIAGNOSIS — Z510 Encounter for antineoplastic radiation therapy: Secondary | ICD-10-CM | POA: Diagnosis not present

## 2024-03-22 DIAGNOSIS — Z191 Hormone sensitive malignancy status: Secondary | ICD-10-CM | POA: Diagnosis not present

## 2024-04-04 ENCOUNTER — Other Ambulatory Visit: Payer: Self-pay

## 2024-04-04 ENCOUNTER — Ambulatory Visit
Admission: RE | Admit: 2024-04-04 | Discharge: 2024-04-04 | Disposition: A | Discharge status: Home / Self Care | Source: Ambulatory Visit | Attending: Radiation Oncology | Admitting: Radiation Oncology

## 2024-04-04 DIAGNOSIS — Z191 Hormone sensitive malignancy status: Secondary | ICD-10-CM | POA: Diagnosis not present

## 2024-04-04 DIAGNOSIS — Z510 Encounter for antineoplastic radiation therapy: Secondary | ICD-10-CM | POA: Diagnosis not present

## 2024-04-04 DIAGNOSIS — C61 Malignant neoplasm of prostate: Secondary | ICD-10-CM | POA: Diagnosis not present

## 2024-04-04 LAB — RAD ONC ARIA SESSION SUMMARY
Course Elapsed Days: 0
Plan Fractions Treated to Date: 1
Plan Prescribed Dose Per Fraction: 2.5 Gy
Plan Total Fractions Prescribed: 28
Plan Total Prescribed Dose: 70 Gy
Reference Point Dosage Given to Date: 2.5 Gy
Reference Point Session Dosage Given: 2.5 Gy
Session Number: 1

## 2024-04-05 ENCOUNTER — Ambulatory Visit
Admission: RE | Admit: 2024-04-05 | Discharge: 2024-04-05 | Disposition: A | Discharge status: Home / Self Care | Source: Ambulatory Visit | Attending: Radiation Oncology

## 2024-04-05 ENCOUNTER — Other Ambulatory Visit: Payer: Self-pay

## 2024-04-05 DIAGNOSIS — Z191 Hormone sensitive malignancy status: Secondary | ICD-10-CM | POA: Diagnosis not present

## 2024-04-05 DIAGNOSIS — C61 Malignant neoplasm of prostate: Secondary | ICD-10-CM | POA: Diagnosis not present

## 2024-04-05 DIAGNOSIS — Z510 Encounter for antineoplastic radiation therapy: Secondary | ICD-10-CM | POA: Diagnosis not present

## 2024-04-05 LAB — RAD ONC ARIA SESSION SUMMARY
Course Elapsed Days: 1
Plan Fractions Treated to Date: 2
Plan Prescribed Dose Per Fraction: 2.5 Gy
Plan Total Fractions Prescribed: 28
Plan Total Prescribed Dose: 70 Gy
Reference Point Dosage Given to Date: 5 Gy
Reference Point Session Dosage Given: 2.5 Gy
Session Number: 2

## 2024-04-06 ENCOUNTER — Other Ambulatory Visit: Payer: Self-pay

## 2024-04-06 ENCOUNTER — Ambulatory Visit
Admission: RE | Admit: 2024-04-06 | Discharge: 2024-04-06 | Disposition: A | Discharge status: Home / Self Care | Source: Ambulatory Visit | Attending: Radiation Oncology | Admitting: Radiation Oncology

## 2024-04-06 ENCOUNTER — Other Ambulatory Visit: Payer: Self-pay | Admitting: Radiation Oncology

## 2024-04-06 DIAGNOSIS — Z191 Hormone sensitive malignancy status: Secondary | ICD-10-CM | POA: Diagnosis not present

## 2024-04-06 DIAGNOSIS — C61 Malignant neoplasm of prostate: Secondary | ICD-10-CM | POA: Diagnosis not present

## 2024-04-06 DIAGNOSIS — Z510 Encounter for antineoplastic radiation therapy: Secondary | ICD-10-CM | POA: Diagnosis not present

## 2024-04-06 LAB — RAD ONC ARIA SESSION SUMMARY
Course Elapsed Days: 2
Plan Fractions Treated to Date: 3
Plan Prescribed Dose Per Fraction: 2.5 Gy
Plan Total Fractions Prescribed: 28
Plan Total Prescribed Dose: 70 Gy
Reference Point Dosage Given to Date: 7.5 Gy
Reference Point Session Dosage Given: 2.5 Gy
Session Number: 3

## 2024-04-06 MED ORDER — FLUCONAZOLE 200 MG PO TABS: 200.0000 mg | ORAL_TABLET | Freq: Every day | ORAL | 0 refills | Status: DC

## 2024-04-07 ENCOUNTER — Ambulatory Visit

## 2024-04-10 ENCOUNTER — Ambulatory Visit
Admission: RE | Admit: 2024-04-10 | Discharge: 2024-04-10 | Disposition: A | Discharge status: Home / Self Care | Source: Ambulatory Visit | Attending: Radiation Oncology | Admitting: Radiation Oncology

## 2024-04-10 ENCOUNTER — Other Ambulatory Visit: Payer: Self-pay

## 2024-04-10 DIAGNOSIS — C61 Malignant neoplasm of prostate: Secondary | ICD-10-CM | POA: Diagnosis not present

## 2024-04-10 DIAGNOSIS — Z510 Encounter for antineoplastic radiation therapy: Secondary | ICD-10-CM | POA: Diagnosis not present

## 2024-04-10 DIAGNOSIS — Z191 Hormone sensitive malignancy status: Secondary | ICD-10-CM | POA: Diagnosis not present

## 2024-04-10 LAB — RAD ONC ARIA SESSION SUMMARY
Course Elapsed Days: 6
Plan Fractions Treated to Date: 4
Plan Prescribed Dose Per Fraction: 2.5 Gy
Plan Total Fractions Prescribed: 28
Plan Total Prescribed Dose: 70 Gy
Reference Point Dosage Given to Date: 10 Gy
Reference Point Session Dosage Given: 2.5 Gy
Session Number: 4

## 2024-04-11 ENCOUNTER — Other Ambulatory Visit: Payer: Self-pay

## 2024-04-11 ENCOUNTER — Ambulatory Visit
Admission: RE | Admit: 2024-04-11 | Discharge: 2024-04-11 | Disposition: A | Discharge status: Home / Self Care | Source: Ambulatory Visit | Attending: Radiation Oncology | Admitting: Radiation Oncology

## 2024-04-11 DIAGNOSIS — Z510 Encounter for antineoplastic radiation therapy: Secondary | ICD-10-CM | POA: Diagnosis not present

## 2024-04-11 DIAGNOSIS — Z191 Hormone sensitive malignancy status: Secondary | ICD-10-CM | POA: Diagnosis not present

## 2024-04-11 DIAGNOSIS — C61 Malignant neoplasm of prostate: Secondary | ICD-10-CM | POA: Diagnosis not present

## 2024-04-11 LAB — RAD ONC ARIA SESSION SUMMARY
Course Elapsed Days: 7
Plan Fractions Treated to Date: 5
Plan Prescribed Dose Per Fraction: 2.5 Gy
Plan Total Fractions Prescribed: 28
Plan Total Prescribed Dose: 70 Gy
Reference Point Dosage Given to Date: 12.5 Gy
Reference Point Session Dosage Given: 2.5 Gy
Session Number: 5

## 2024-04-12 ENCOUNTER — Emergency Department (HOSPITAL_BASED_OUTPATIENT_CLINIC_OR_DEPARTMENT_OTHER)

## 2024-04-12 ENCOUNTER — Other Ambulatory Visit: Payer: Self-pay

## 2024-04-12 ENCOUNTER — Ambulatory Visit: Payer: Self-pay | Admitting: Internal Medicine

## 2024-04-12 ENCOUNTER — Emergency Department (HOSPITAL_BASED_OUTPATIENT_CLINIC_OR_DEPARTMENT_OTHER)
Admission: EM | Admit: 2024-04-12 | Discharge: 2024-04-13 | Disposition: A | Discharge status: Home / Self Care | Attending: Emergency Medicine | Admitting: Emergency Medicine

## 2024-04-12 ENCOUNTER — Ambulatory Visit
Admission: RE | Admit: 2024-04-12 | Discharge: 2024-04-12 | Disposition: A | Discharge status: Home / Self Care | Source: Ambulatory Visit | Attending: Radiation Oncology | Admitting: Radiation Oncology

## 2024-04-12 ENCOUNTER — Encounter (HOSPITAL_BASED_OUTPATIENT_CLINIC_OR_DEPARTMENT_OTHER): Payer: Self-pay

## 2024-04-12 DIAGNOSIS — J441 Chronic obstructive pulmonary disease with (acute) exacerbation: Secondary | ICD-10-CM | POA: Insufficient documentation

## 2024-04-12 DIAGNOSIS — J45901 Unspecified asthma with (acute) exacerbation: Secondary | ICD-10-CM | POA: Diagnosis not present

## 2024-04-12 DIAGNOSIS — Z510 Encounter for antineoplastic radiation therapy: Secondary | ICD-10-CM | POA: Diagnosis not present

## 2024-04-12 DIAGNOSIS — Z8546 Personal history of malignant neoplasm of prostate: Secondary | ICD-10-CM | POA: Diagnosis not present

## 2024-04-12 DIAGNOSIS — R051 Acute cough: Secondary | ICD-10-CM | POA: Diagnosis not present

## 2024-04-12 DIAGNOSIS — Z7982 Long term (current) use of aspirin: Secondary | ICD-10-CM | POA: Diagnosis not present

## 2024-04-12 DIAGNOSIS — R911 Solitary pulmonary nodule: Secondary | ICD-10-CM | POA: Diagnosis not present

## 2024-04-12 DIAGNOSIS — R059 Cough, unspecified: Secondary | ICD-10-CM | POA: Diagnosis present

## 2024-04-12 DIAGNOSIS — Z7951 Long term (current) use of inhaled steroids: Secondary | ICD-10-CM | POA: Diagnosis not present

## 2024-04-12 DIAGNOSIS — J159 Unspecified bacterial pneumonia: Secondary | ICD-10-CM | POA: Diagnosis not present

## 2024-04-12 DIAGNOSIS — C61 Malignant neoplasm of prostate: Secondary | ICD-10-CM | POA: Diagnosis not present

## 2024-04-12 DIAGNOSIS — R0602 Shortness of breath: Secondary | ICD-10-CM | POA: Diagnosis not present

## 2024-04-12 DIAGNOSIS — I700 Atherosclerosis of aorta: Secondary | ICD-10-CM | POA: Diagnosis not present

## 2024-04-12 DIAGNOSIS — Z191 Hormone sensitive malignancy status: Secondary | ICD-10-CM | POA: Diagnosis not present

## 2024-04-12 LAB — BASIC METABOLIC PANEL WITH GFR
Anion gap: 13 (ref 5–15)
BUN: 6 mg/dL — ABNORMAL LOW (ref 8–23)
CO2: 25 mmol/L (ref 22–32)
Calcium: 9.5 mg/dL (ref 8.9–10.3)
Chloride: 101 mmol/L (ref 98–111)
Creatinine, Ser: 1.12 mg/dL (ref 0.61–1.24)
GFR, Estimated: >60 mL/min (ref >60)
Glucose, Bld: 139 mg/dL — ABNORMAL HIGH (ref 70–99)
Potassium: 3.8 mmol/L (ref 3.5–5.1)
Sodium: 139 mmol/L (ref 135–145)

## 2024-04-12 LAB — RAD ONC ARIA SESSION SUMMARY
Course Elapsed Days: 8
Plan Fractions Treated to Date: 6
Plan Prescribed Dose Per Fraction: 2.5 Gy
Plan Total Fractions Prescribed: 28
Plan Total Prescribed Dose: 70 Gy
Reference Point Dosage Given to Date: 15 Gy
Reference Point Session Dosage Given: 2.5 Gy
Session Number: 6

## 2024-04-12 LAB — RESP PANEL BY RT-PCR (RSV, FLU A&B, COVID)  RVPGX2
Influenza A by PCR: NEGATIVE
Influenza B by PCR: NEGATIVE
Resp Syncytial Virus by PCR: NEGATIVE
SARS Coronavirus 2 by RT PCR: NEGATIVE

## 2024-04-12 LAB — CBC WITH DIFFERENTIAL/PLATELET
Abs Immature Granulocytes: 0.04 K/uL (ref 0.00–0.07)
Basophils Absolute: 0 K/uL (ref 0.0–0.1)
Basophils Relative: 1 %
Eosinophils Absolute: 0.6 K/uL — ABNORMAL HIGH (ref 0.0–0.5)
Eosinophils Relative: 10 %
HCT: 39.8 % (ref 39.0–52.0)
Hemoglobin: 14.8 g/dL (ref 13.0–17.0)
Immature Granulocytes: 1 %
Lymphocytes Relative: 25 %
Lymphs Abs: 1.5 K/uL (ref 0.7–4.0)
MCH: 30.8 pg (ref 26.0–34.0)
MCHC: 37.2 g/dL — ABNORMAL HIGH (ref 30.0–36.0)
MCV: 82.7 fL (ref 80.0–100.0)
Monocytes Absolute: 0.8 K/uL (ref 0.1–1.0)
Monocytes Relative: 14 %
Neutro Abs: 2.9 K/uL (ref 1.7–7.7)
Neutrophils Relative %: 49 %
Platelets: 252 K/uL (ref 150–400)
RBC: 4.81 MIL/uL (ref 4.22–5.81)
RDW: 14.7 % (ref 11.5–15.5)
WBC: 5.9 K/uL (ref 4.0–10.5)
nRBC: 0 % (ref 0.0–0.2)

## 2024-04-12 MED ORDER — ALBUTEROL SULFATE (2.5 MG/3ML) 0.083% IN NEBU
INHALATION_SOLUTION | RESPIRATORY_TRACT | Status: AC
  Administered 2024-04-12: 10 mg via RESPIRATORY_TRACT
  Filled 2024-04-12: qty 12

## 2024-04-12 MED ORDER — IPRATROPIUM BROMIDE 0.02 % IN SOLN: 0.5000 mg | Freq: Once | RESPIRATORY_TRACT | Status: AC

## 2024-04-12 MED ORDER — METHYLPREDNISOLONE SODIUM SUCC 125 MG IJ SOLR
125.0000 mg | Freq: Once | INTRAMUSCULAR | Status: AC
  Administered 2024-04-12: 125 mg via INTRAVENOUS
  Filled 2024-04-12: qty 2

## 2024-04-12 MED ORDER — ALBUTEROL SULFATE (2.5 MG/3ML) 0.083% IN NEBU: 10.0000 mg | INHALATION_SOLUTION | Freq: Once | RESPIRATORY_TRACT | Status: AC

## 2024-04-12 MED ORDER — IPRATROPIUM-ALBUTEROL 0.5-2.5 (3) MG/3ML IN SOLN: 3.0000 mL | RESPIRATORY_TRACT | Status: DC

## 2024-04-12 MED ORDER — MAGNESIUM SULFATE 2 GM/50ML IV SOLN
2.0000 g | Freq: Once | INTRAVENOUS | Status: AC
  Administered 2024-04-12: 2 g via INTRAVENOUS
  Filled 2024-04-12: qty 50

## 2024-04-12 MED ORDER — IOHEXOL 350 MG/ML SOLN
75.0000 mL | Freq: Once | INTRAVENOUS | Status: AC | PRN
  Administered 2024-04-12: 75 mL via INTRAVENOUS

## 2024-04-12 MED ORDER — IPRATROPIUM BROMIDE 0.02 % IN SOLN
RESPIRATORY_TRACT | Status: AC
  Administered 2024-04-12: 0.5 mg via RESPIRATORY_TRACT
  Filled 2024-04-12: qty 2.5

## 2024-04-12 NOTE — ED Notes (Signed)
 To CT

## 2024-04-12 NOTE — ED Triage Notes (Signed)
 Pt reports SHOB for the last two weeks with hx of COPD and prostate cancer, currently receiving treatment. Pt reports worsening over the last two weeks. Pt alert and oriented, ambulatory to triage. Reports chest tightness.

## 2024-04-12 NOTE — Telephone Encounter (Signed)
 FYI Only or Action Required?: FYI only for provider.  Patient is followed in Pulmonology for COPD, OSA, and lung nodule, last seen on 01/10/2024 by [name], MD.  Called Nurse Triage reporting Shortness of Breath, Chest Pain, no relief from rescue inhaler though using every 2 hours, and Cough.  Symptoms began several weeks ago.  Interventions attempted: Rescue inhaler, Maintenance inhaler, Nebulizer treatments, and Other: UC.  Symptoms are: rapidly worsening.  Triage Disposition: Call EMS 911 Now  Patient/caregiver understands and will follow disposition?: Unsure     Copied from CRM [id number]. Topic: Clinical - Red Word Triage >> Apr 12, 2024 10:03 AM Rilla NOVAK wrote: [facility] that prompted transfer to Nurse Triage: Difficulty Breathing Reason for Disposition  [1] Chest pain lasts > 5 minutes AND [2] age > 76  Answer Assessment - Initial Assessment Questions Wife Bijan Ridgley, just got married, pt in background  [id number] Pulmonary Triage - Initial Assessment Questions Chief Complaint (e.g., cough, sob, wheezing, fever, chills, sweat or additional symptoms) *Go to specific symptom protocol after initial questions. SOB more than usual, all the time, even while sitting and relaxing Went to UC 3 weeks ago, said needed immediate treatment, gave treatment then, told him to contact doc, didn't bc pt had so much going on so now wife calling today Sometimes when cough, chest pain 3/10, lasts longer than 5 min at a time, constantly coughing and gasping for air Struggling to get out more than couple words at a time Breathing fast most of the time CPAP  How long have symptoms been present? 3 weeks, worsening  Have you used your inhalers/maintenance medication? Yes If yes, What medications? Inhaler and nebulizer Trelegy albuterol   If inhaler, ask How many puffs and how often? Note: Review instructions on medication in the chart. Albuterol  inhaler every 2 hours as  needed, not providing any relief  OXYGEN: Do you wear supplemental oxygen? No  Do you monitor your oxygen levels? No  6. CARDIAC HISTORY: Do you have any history of heart disease? (e.g., heart attack, angina, bypass surgery, angioplasty)      Denies, no blood clots in legs or lungs  7. LUNG HISTORY: Do you have any history of lung disease?  (e.g., pulmonary embolus, asthma, emphysema)     COPD, OSA, lung nodule  Advised hospital asap call 911 Thought about calling 911 but already going to do treatment at the hospital for cancer, he goes to hospital at 12:30 pm for appt, advised pt be seen at hospital in ED asap, advised call 911, confirmed can follow up with pulm after hospital Wife states they will do that   Advised pt be examined in hospital asap, call 911, wife states they will go to hospital now.  Protocols used: Breathing Difficulty-A-AH, Chest Pain-A-AH   Clarification: Nurse Advised hospital asap call 911, Wife Thought about calling 911 but already going to do treatment at the hospital for cancer, he goes to hospital at 12:30 pm for appt, nurse advised pt be seen at hospital in ED asap, advised call 911, confirmed can follow up with pulm after hospital. Wife states they will do that

## 2024-04-12 NOTE — Telephone Encounter (Signed)
 Attempted to call patient.  Left message on home number.  Unable to leave voice mail on mobile number listed.  Tried to call alternate contact numbers.  Left message on each number to call our office back with information on patient.    Will attempt to call patient again.

## 2024-04-12 NOTE — ED Provider Notes (Signed)
 [place] EMERGENCY DEPARTMENT AT [facility] Provider Note   CSN: [id number] Arrival date & time: 04/12/24  8040     Patient presents with: Shortness of Breath   Joshua Stephenson is a 68 y.o. male.  {Add pertinent medical, surgical, social history, OB history to HPI:32949} HPI   68 year old male with past medical history of COPD, prostate cancer presents to the emergency department with shortness of breath.  Patient states this has been ongoing for about the last 2 weeks, associated with productive cough, wheezing.  Not resolved with his home inhalers/medications.  Was seen in urgent care, thought to have pneumonia, placed on antibiotic and prescribed steroids that he had not started.  Denies any fever or chills.  No swelling of the lower extremities.  No chest/back pain.  Prior to Admission medications   Medication Sig Start Date End Date Taking? Authorizing Provider  albuterol  (PROVENTIL ) (2.5 MG/3ML) 0.083% nebulizer solution Take 3 mLs (2.5 mg total) by nebulization every 6 (six) hours as needed for wheezing or shortness of breath. 01/10/24   Neysa Rama D, MD  albuterol  (VENTOLIN  HFA) 108 (90 Base) MCG/ACT inhaler Inhale 1 -2 puffs every 4-6 hours as needed- rescue. 01/10/24   Neysa Rama BIRCH, MD  aspirin  EC 81 MG tablet Take 1 tablet (81 mg total) by mouth daily. Swallow whole. 06/10/22   Tolia, Sunit, DO  atorvastatin  (LIPITOR) 20 MG tablet TAKE 1 TABLET BY MOUTH EVERYDAY AT BEDTIME 07/26/23   Tolia, Sunit, DO  clonazePAM  (KLONOPIN ) 0.5 MG tablet Take 1 tablet (0.5 mg total) by mouth at bedtime. 03/31/23   Hope Almarie ORN, NP  escitalopram  (LEXAPRO ) 10 MG tablet Take 10 mg by mouth daily. 01/19/23   [provider]  fluconazole  (DIFLUCAN ) 200 MG tablet Take 1 tablet (200 mg total) by mouth daily. 04/06/24   Patrcia Cough, MD  Fluticasone-Umeclidin-Vilant (TRELEGY ELLIPTA ) 200-62.5-25 MCG/ACT AEPB Inhale 1 puff then rinse mouth, once daily 01/10/24    Neysa Rama D, MD  gabapentin  (NEURONTIN ) 300 MG capsule Take 300 mg by mouth 2 (two) times daily.    [provider]  methocarbamol  (ROBAXIN ) 750 MG tablet Take 750 mg by mouth 4 (four) times daily. 12/09/22   [provider]  metoprolol  succinate (TOPROL  XL) 50 MG 24 hr tablet Take 1 tablet (50 mg total) by mouth daily. Take with or immediately following a meal. 07/26/23   Tolia, Sunit, DO  nabumetone (RELAFEN) 750 MG tablet Take 750 mg by mouth 2 (two) times daily. 03/29/23   [provider]  pantoprazole  (PROTONIX ) 40 MG tablet Take 40 mg by mouth 2 (two) times daily.    [provider]  sildenafil (VIAGRA) 100 MG tablet Take 100 mg by mouth as needed for erectile dysfunction. 09/10/23   [provider]  tamsulosin (FLOMAX) 0.4 MG CAPS capsule Take 0.4 mg by mouth at bedtime. 10/08/23   [provider]  dicyclomine  (BENTYL ) 20 MG tablet Take 1 tablet (20 mg total) by mouth 4 (four) times daily -  before meals and at bedtime. As needed for cramps and diarrhea 04/04/18 02/25/20  Avram Lupita BRAVO, MD  triazolam  (HALCION ) 0.25 MG tablet Take 1 tab by mouth 1 hour prior to procedure with very light food, do not drive motor vehicle. 06/23/18 02/25/20  Eldonna Novel, MD    Allergies: Patient has no known allergies.    Review of Systems  Constitutional:  Positive for fatigue. Negative for fever.  Respiratory:  Positive for cough, chest tightness,  shortness of breath and wheezing.   Cardiovascular:  Negative for chest pain and leg swelling.  Gastrointestinal:  Negative for abdominal pain, diarrhea and vomiting.  Skin:  Negative for rash.  Neurological:  Negative for headaches.    Updated Vital Signs BP (!) 160/92   Pulse 96   Temp 98.4 F (36.9 C)   Resp (!) 21   Ht 5' 7 (1.702 m)   Wt 73 kg   SpO2 95%   BMI 25.22 kg/m   Physical Exam Vitals and nursing note reviewed.  Constitutional:      General: He is in acute distress.      Appearance: Normal appearance.  HENT:     Head: Normocephalic.     Mouth/Throat:     Mouth: Mucous membranes are moist.  Cardiovascular:     Rate and Rhythm: Normal rate.  Pulmonary:     Effort: Tachypnea and accessory muscle usage present. No respiratory distress.     Breath sounds: Examination of the right-lower field reveals decreased breath sounds. Examination of the left-lower field reveals decreased breath sounds. Decreased breath sounds and wheezing present.  Abdominal:     Palpations: Abdomen is soft.     Tenderness: There is no abdominal tenderness.  Musculoskeletal:     Right lower leg: No edema.     Left lower leg: No edema.  Skin:    General: Skin is warm.  Neurological:     Mental Status: He is alert and oriented to person, place, and time. Mental status is at baseline.  Psychiatric:        Mood and Affect: Mood normal.     (all labs ordered are listed, but only abnormal results are displayed) Labs Reviewed  CBC WITH DIFFERENTIAL/PLATELET - Abnormal; Notable for the following components:      Result Value   MCHC 37.2 (*)    Eosinophils Absolute 0.6 (*)    All other components within normal limits  BASIC METABOLIC PANEL WITH GFR - Abnormal; Notable for the following components:   Glucose, Bld 139 (*)    BUN 6 (*)    All other components within normal limits  RESP PANEL BY RT-PCR (RSV, FLU A&B, COVID)  [id number]    EKG: None  Radiology: DG Chest Port 1 View Result Date: 04/12/2024 CLINICAL DATA:  sob EXAM: PORTABLE CHEST 1 VIEW COMPARISON:  Chest x-ray 11/18/2023 FINDINGS: The heart and mediastinal contours are within normal limits. Atherosclerotic plaque. No focal consolidation. No pulmonary edema. No pleural effusion. No pneumothorax. No acute osseous abnormality. IMPRESSION: 1. No active disease. 2.  Aortic Atherosclerosis (ICD10-I70.0). Electronically Signed   By: Morgane  Naveau M.D.   On: 04/12/2024 20:38    {Document cardiac monitor, telemetry assessment  procedure when appropriate:32947} Procedures   Medications Ordered in the ED  magnesium  sulfate IVPB 2 g 50 mL (has no administration in time range)  methylPREDNISolone  sodium succinate (SOLU-MEDROL ) 125 mg/2 mL injection 125 mg (125 mg Intravenous Given 04/12/24 2039)  albuterol  (PROVENTIL ) (2.5 MG/3ML) 0.083% nebulizer solution 10 mg (10 mg Nebulization Given 04/12/24 2033)  ipratropium (ATROVENT ) nebulizer solution 0.5 mg (0.5 mg Nebulization Given 04/12/24 2033)      {Click here for ABCD2, HEART and other calculators REFRESH Note before signing:1}                              Medical Decision Making Amount and/or Complexity of Data Reviewed Labs: ordered. Radiology: ordered.  Risk Prescription drug management.   ***  {Document critical care time when appropriate  Document review of labs and clinical decision tools ie CHADS2VASC2, etc  Document your independent review of radiology images and any outside records  Document your discussion with family members, caretakers and with consultants  Document social determinants of health affecting pt's care  Document your decision making why or why not admission, treatments were needed:32947:::1}   Final diagnoses:  None    ED Discharge Orders     None

## 2024-04-12 NOTE — ED Notes (Signed)
 Patient ambulated back from the restroom; moderate dyspnea noted; all monitor re-applied

## 2024-04-13 ENCOUNTER — Ambulatory Visit
Admission: RE | Admit: 2024-04-13 | Discharge: 2024-04-13 | Disposition: A | Discharge status: Home / Self Care | Source: Ambulatory Visit | Attending: Radiation Oncology | Admitting: Radiation Oncology

## 2024-04-13 ENCOUNTER — Other Ambulatory Visit: Payer: Self-pay

## 2024-04-13 DIAGNOSIS — Z510 Encounter for antineoplastic radiation therapy: Secondary | ICD-10-CM | POA: Diagnosis not present

## 2024-04-13 DIAGNOSIS — J441 Chronic obstructive pulmonary disease with (acute) exacerbation: Secondary | ICD-10-CM | POA: Diagnosis not present

## 2024-04-13 DIAGNOSIS — C61 Malignant neoplasm of prostate: Secondary | ICD-10-CM | POA: Diagnosis not present

## 2024-04-13 LAB — RAD ONC ARIA SESSION SUMMARY
Course Elapsed Days: 9
Plan Fractions Treated to Date: 7
Plan Prescribed Dose Per Fraction: 2.5 Gy
Plan Total Fractions Prescribed: 28
Plan Total Prescribed Dose: 70 Gy
Reference Point Dosage Given to Date: 17.5 Gy
Reference Point Session Dosage Given: 2.5 Gy
Session Number: 7

## 2024-04-13 MED ORDER — BENZONATATE 100 MG PO CAPS
200.0000 mg | ORAL_CAPSULE | Freq: Once | ORAL | Status: AC
  Administered 2024-04-13: 200 mg via ORAL
  Filled 2024-04-13: qty 2

## 2024-04-13 MED ORDER — GUAIFENESIN ER 600 MG PO TB12: 600.0000 mg | ORAL_TABLET | Freq: Two times a day (BID) | ORAL | 0 refills | Status: AC

## 2024-04-13 MED ORDER — ALBUTEROL SULFATE (2.5 MG/3ML) 0.083% IN NEBU: 2.5000 mg | INHALATION_SOLUTION | Freq: Four times a day (QID) | RESPIRATORY_TRACT | 3 refills | Status: DC | PRN

## 2024-04-13 MED ORDER — ALBUTEROL SULFATE HFA 108 (90 BASE) MCG/ACT IN AERS: INHALATION_SPRAY | RESPIRATORY_TRACT | 12 refills | Status: AC

## 2024-04-13 NOTE — Discharge Instructions (Signed)
 You have been seen and discharged from the emergency department.  Continue to use your inhaler/nebulizer at home.  Take antibiotic and steroids as previously prescribed.  Include Mucinex  into your regimen as prescribed to help with mucus production.  Follow-up with your primary provider for further evaluation and further care. Take home medications as prescribed. If you have any worsening symptoms or further concerns for your health please return to an emergency department for further evaluation.

## 2024-04-13 NOTE — Telephone Encounter (Signed)
 Pt was admitted yesterday. NFN

## 2024-04-14 ENCOUNTER — Ambulatory Visit
Admission: RE | Admit: 2024-04-14 | Discharge: 2024-04-14 | Disposition: A | Discharge status: Home / Self Care | Source: Ambulatory Visit | Attending: Radiation Oncology | Admitting: Radiation Oncology

## 2024-04-14 ENCOUNTER — Other Ambulatory Visit: Payer: Self-pay

## 2024-04-14 DIAGNOSIS — Z510 Encounter for antineoplastic radiation therapy: Secondary | ICD-10-CM | POA: Diagnosis not present

## 2024-04-14 DIAGNOSIS — Z191 Hormone sensitive malignancy status: Secondary | ICD-10-CM | POA: Diagnosis not present

## 2024-04-14 DIAGNOSIS — C61 Malignant neoplasm of prostate: Secondary | ICD-10-CM | POA: Diagnosis not present

## 2024-04-14 LAB — RAD ONC ARIA SESSION SUMMARY
Course Elapsed Days: 10
Plan Fractions Treated to Date: 8
Plan Prescribed Dose Per Fraction: 2.5 Gy
Plan Total Fractions Prescribed: 28
Plan Total Prescribed Dose: 70 Gy
Reference Point Dosage Given to Date: 20 Gy
Reference Point Session Dosage Given: 2.5 Gy
Session Number: 8

## 2024-04-17 ENCOUNTER — Ambulatory Visit
Admission: RE | Admit: 2024-04-17 | Discharge: 2024-04-17 | Disposition: A | Discharge status: Home / Self Care | Source: Ambulatory Visit | Attending: Radiation Oncology

## 2024-04-17 ENCOUNTER — Other Ambulatory Visit: Payer: Self-pay

## 2024-04-17 DIAGNOSIS — Z191 Hormone sensitive malignancy status: Secondary | ICD-10-CM | POA: Diagnosis not present

## 2024-04-17 DIAGNOSIS — C61 Malignant neoplasm of prostate: Secondary | ICD-10-CM | POA: Diagnosis not present

## 2024-04-17 DIAGNOSIS — Z510 Encounter for antineoplastic radiation therapy: Secondary | ICD-10-CM | POA: Diagnosis not present

## 2024-04-17 LAB — RAD ONC ARIA SESSION SUMMARY
Course Elapsed Days: 13
Plan Fractions Treated to Date: 9
Plan Prescribed Dose Per Fraction: 2.5 Gy
Plan Total Fractions Prescribed: 28
Plan Total Prescribed Dose: 70 Gy
Reference Point Dosage Given to Date: 22.5 Gy
Reference Point Session Dosage Given: 2.5 Gy
Session Number: 9

## 2024-04-18 ENCOUNTER — Other Ambulatory Visit: Payer: Self-pay

## 2024-04-18 ENCOUNTER — Ambulatory Visit
Admission: RE | Admit: 2024-04-18 | Discharge: 2024-04-18 | Disposition: A | Discharge status: Home / Self Care | Source: Ambulatory Visit | Attending: Radiation Oncology

## 2024-04-18 DIAGNOSIS — Z510 Encounter for antineoplastic radiation therapy: Secondary | ICD-10-CM | POA: Diagnosis not present

## 2024-04-18 DIAGNOSIS — Z191 Hormone sensitive malignancy status: Secondary | ICD-10-CM | POA: Diagnosis not present

## 2024-04-18 DIAGNOSIS — C61 Malignant neoplasm of prostate: Secondary | ICD-10-CM | POA: Diagnosis not present

## 2024-04-18 LAB — RAD ONC ARIA SESSION SUMMARY
Course Elapsed Days: 14
Plan Fractions Treated to Date: 10
Plan Prescribed Dose Per Fraction: 2.5 Gy
Plan Total Fractions Prescribed: 28
Plan Total Prescribed Dose: 70 Gy
Reference Point Dosage Given to Date: 25 Gy
Reference Point Session Dosage Given: 2.5 Gy
Session Number: 10

## 2024-04-19 ENCOUNTER — Ambulatory Visit
Admission: RE | Admit: 2024-04-19 | Discharge: 2024-04-19 | Disposition: A | Discharge status: Home / Self Care | Source: Ambulatory Visit | Attending: Radiation Oncology | Admitting: Radiation Oncology

## 2024-04-19 ENCOUNTER — Other Ambulatory Visit: Payer: Self-pay

## 2024-04-19 DIAGNOSIS — Z510 Encounter for antineoplastic radiation therapy: Secondary | ICD-10-CM | POA: Diagnosis not present

## 2024-04-19 DIAGNOSIS — C61 Malignant neoplasm of prostate: Secondary | ICD-10-CM | POA: Diagnosis not present

## 2024-04-19 DIAGNOSIS — Z191 Hormone sensitive malignancy status: Secondary | ICD-10-CM | POA: Diagnosis not present

## 2024-04-19 LAB — RAD ONC ARIA SESSION SUMMARY
Course Elapsed Days: 15
Plan Fractions Treated to Date: 11
Plan Prescribed Dose Per Fraction: 2.5 Gy
Plan Total Fractions Prescribed: 28
Plan Total Prescribed Dose: 70 Gy
Reference Point Dosage Given to Date: 27.5 Gy
Reference Point Session Dosage Given: 2.5 Gy
Session Number: 11

## 2024-04-20 ENCOUNTER — Ambulatory Visit
Admission: RE | Admit: 2024-04-20 | Discharge: 2024-04-20 | Disposition: A | Discharge status: Home / Self Care | Source: Ambulatory Visit | Attending: Radiation Oncology | Admitting: Radiation Oncology

## 2024-04-20 ENCOUNTER — Other Ambulatory Visit: Payer: Self-pay

## 2024-04-20 DIAGNOSIS — C61 Malignant neoplasm of prostate: Secondary | ICD-10-CM | POA: Diagnosis not present

## 2024-04-20 DIAGNOSIS — Z510 Encounter for antineoplastic radiation therapy: Secondary | ICD-10-CM | POA: Diagnosis not present

## 2024-04-20 LAB — RAD ONC ARIA SESSION SUMMARY
Course Elapsed Days: 16
Plan Fractions Treated to Date: 12
Plan Prescribed Dose Per Fraction: 2.5 Gy
Plan Total Fractions Prescribed: 28
Plan Total Prescribed Dose: 70 Gy
Reference Point Dosage Given to Date: 30 Gy
Reference Point Session Dosage Given: 2.5 Gy
Session Number: 12

## 2024-04-21 ENCOUNTER — Ambulatory Visit

## 2024-04-21 NOTE — Procedures (Signed)
Mask fit

## 2024-04-24 ENCOUNTER — Ambulatory Visit
Admission: RE | Admit: 2024-04-24 | Discharge: 2024-04-24 | Disposition: A | Discharge status: Home / Self Care | Source: Ambulatory Visit | Attending: Radiation Oncology | Admitting: Radiation Oncology

## 2024-04-24 ENCOUNTER — Other Ambulatory Visit: Payer: Self-pay

## 2024-04-24 ENCOUNTER — Ambulatory Visit
Admission: RE | Admit: 2024-04-24 | Discharge: 2024-04-24 | Disposition: A | Discharge status: Home / Self Care | Source: Ambulatory Visit | Attending: Radiation Oncology

## 2024-04-24 DIAGNOSIS — C61 Malignant neoplasm of prostate: Secondary | ICD-10-CM | POA: Insufficient documentation

## 2024-04-24 DIAGNOSIS — Z510 Encounter for antineoplastic radiation therapy: Secondary | ICD-10-CM | POA: Insufficient documentation

## 2024-04-24 DIAGNOSIS — Z191 Hormone sensitive malignancy status: Secondary | ICD-10-CM | POA: Diagnosis not present

## 2024-04-24 LAB — RAD ONC ARIA SESSION SUMMARY
Course Elapsed Days: 20
Plan Fractions Treated to Date: 13
Plan Prescribed Dose Per Fraction: 2.5 Gy
Plan Total Fractions Prescribed: 28
Plan Total Prescribed Dose: 70 Gy
Reference Point Dosage Given to Date: 32.5 Gy
Reference Point Session Dosage Given: 2.5 Gy
Session Number: 13

## 2024-04-25 ENCOUNTER — Other Ambulatory Visit: Payer: Self-pay

## 2024-04-25 ENCOUNTER — Ambulatory Visit
Admission: RE | Admit: 2024-04-25 | Discharge: 2024-04-25 | Disposition: A | Discharge status: Home / Self Care | Source: Ambulatory Visit | Attending: Radiation Oncology | Admitting: Radiation Oncology

## 2024-04-25 DIAGNOSIS — Z510 Encounter for antineoplastic radiation therapy: Secondary | ICD-10-CM | POA: Diagnosis not present

## 2024-04-25 DIAGNOSIS — C61 Malignant neoplasm of prostate: Secondary | ICD-10-CM | POA: Diagnosis not present

## 2024-04-25 DIAGNOSIS — Z191 Hormone sensitive malignancy status: Secondary | ICD-10-CM | POA: Diagnosis not present

## 2024-04-25 LAB — RAD ONC ARIA SESSION SUMMARY
Course Elapsed Days: 21
Plan Fractions Treated to Date: 14
Plan Prescribed Dose Per Fraction: 2.5 Gy
Plan Total Fractions Prescribed: 28
Plan Total Prescribed Dose: 70 Gy
Reference Point Dosage Given to Date: 35 Gy
Reference Point Session Dosage Given: 2.5 Gy
Session Number: 14

## 2024-04-26 ENCOUNTER — Other Ambulatory Visit: Payer: Self-pay

## 2024-04-26 ENCOUNTER — Ambulatory Visit
Admission: RE | Admit: 2024-04-26 | Discharge: 2024-04-26 | Disposition: A | Discharge status: Home / Self Care | Source: Ambulatory Visit | Attending: Radiation Oncology

## 2024-04-26 DIAGNOSIS — Z510 Encounter for antineoplastic radiation therapy: Secondary | ICD-10-CM | POA: Diagnosis not present

## 2024-04-26 DIAGNOSIS — Z191 Hormone sensitive malignancy status: Secondary | ICD-10-CM | POA: Diagnosis not present

## 2024-04-26 DIAGNOSIS — C61 Malignant neoplasm of prostate: Secondary | ICD-10-CM | POA: Diagnosis not present

## 2024-04-26 LAB — RAD ONC ARIA SESSION SUMMARY
Course Elapsed Days: 22
Plan Fractions Treated to Date: 15
Plan Prescribed Dose Per Fraction: 2.5 Gy
Plan Total Fractions Prescribed: 28
Plan Total Prescribed Dose: 70 Gy
Reference Point Dosage Given to Date: 37.5 Gy
Reference Point Session Dosage Given: 2.5 Gy
Session Number: 15

## 2024-04-27 ENCOUNTER — Ambulatory Visit
Admission: RE | Admit: 2024-04-27 | Discharge: 2024-04-27 | Disposition: A | Discharge status: Home / Self Care | Source: Ambulatory Visit | Attending: Radiation Oncology | Admitting: Radiation Oncology

## 2024-04-27 ENCOUNTER — Other Ambulatory Visit: Payer: Self-pay

## 2024-04-27 DIAGNOSIS — Z510 Encounter for antineoplastic radiation therapy: Secondary | ICD-10-CM | POA: Diagnosis not present

## 2024-04-27 DIAGNOSIS — C61 Malignant neoplasm of prostate: Secondary | ICD-10-CM | POA: Diagnosis not present

## 2024-04-27 DIAGNOSIS — Z191 Hormone sensitive malignancy status: Secondary | ICD-10-CM | POA: Diagnosis not present

## 2024-04-27 LAB — RAD ONC ARIA SESSION SUMMARY
Course Elapsed Days: 23
Plan Fractions Treated to Date: 16
Plan Prescribed Dose Per Fraction: 2.5 Gy
Plan Total Fractions Prescribed: 28
Plan Total Prescribed Dose: 70 Gy
Reference Point Dosage Given to Date: 40 Gy
Reference Point Session Dosage Given: 2.5 Gy
Session Number: 16

## 2024-04-28 ENCOUNTER — Ambulatory Visit
Admission: RE | Admit: 2024-04-28 | Discharge: 2024-04-28 | Disposition: A | Discharge status: Home / Self Care | Source: Ambulatory Visit | Attending: Radiation Oncology | Admitting: Radiation Oncology

## 2024-04-28 ENCOUNTER — Other Ambulatory Visit: Payer: Self-pay

## 2024-04-28 DIAGNOSIS — C61 Malignant neoplasm of prostate: Secondary | ICD-10-CM | POA: Diagnosis not present

## 2024-04-28 DIAGNOSIS — Z191 Hormone sensitive malignancy status: Secondary | ICD-10-CM | POA: Diagnosis not present

## 2024-04-28 DIAGNOSIS — Z510 Encounter for antineoplastic radiation therapy: Secondary | ICD-10-CM | POA: Diagnosis not present

## 2024-04-28 LAB — RAD ONC ARIA SESSION SUMMARY
Course Elapsed Days: 24
Plan Fractions Treated to Date: 17
Plan Prescribed Dose Per Fraction: 2.5 Gy
Plan Total Fractions Prescribed: 28
Plan Total Prescribed Dose: 70 Gy
Reference Point Dosage Given to Date: 42.5 Gy
Reference Point Session Dosage Given: 2.5 Gy
Session Number: 17

## 2024-05-01 ENCOUNTER — Other Ambulatory Visit: Payer: Self-pay | Admitting: Radiation Oncology

## 2024-05-01 ENCOUNTER — Ambulatory Visit
Admission: RE | Admit: 2024-05-01 | Discharge: 2024-05-01 | Disposition: A | Discharge status: Home / Self Care | Source: Ambulatory Visit | Attending: Radiation Oncology | Admitting: Radiation Oncology

## 2024-05-01 ENCOUNTER — Other Ambulatory Visit: Payer: Self-pay

## 2024-05-01 ENCOUNTER — Ambulatory Visit
Admission: RE | Admit: 2024-05-01 | Discharge: 2024-05-01 | Disposition: A | Discharge status: Home / Self Care | Source: Ambulatory Visit | Attending: Radiation Oncology

## 2024-05-01 DIAGNOSIS — Z510 Encounter for antineoplastic radiation therapy: Secondary | ICD-10-CM | POA: Diagnosis not present

## 2024-05-01 DIAGNOSIS — Z191 Hormone sensitive malignancy status: Secondary | ICD-10-CM | POA: Diagnosis not present

## 2024-05-01 DIAGNOSIS — C61 Malignant neoplasm of prostate: Secondary | ICD-10-CM | POA: Diagnosis not present

## 2024-05-01 LAB — RAD ONC ARIA SESSION SUMMARY
Course Elapsed Days: 27
Plan Fractions Treated to Date: 18
Plan Prescribed Dose Per Fraction: 2.5 Gy
Plan Total Fractions Prescribed: 28
Plan Total Prescribed Dose: 70 Gy
Reference Point Dosage Given to Date: 45 Gy
Reference Point Session Dosage Given: 2.5 Gy
Session Number: 18

## 2024-05-01 MED ORDER — TAMSULOSIN HCL 0.4 MG PO CAPS
0.4000 mg | ORAL_CAPSULE | Freq: Every day | ORAL | 5 refills | Status: AC
Start: 2024-05-01 — End: ?

## 2024-05-02 ENCOUNTER — Other Ambulatory Visit: Payer: Self-pay

## 2024-05-02 ENCOUNTER — Ambulatory Visit
Admission: RE | Admit: 2024-05-02 | Discharge: 2024-05-02 | Disposition: A | Discharge status: Home / Self Care | Source: Ambulatory Visit | Attending: Radiation Oncology

## 2024-05-02 DIAGNOSIS — C61 Malignant neoplasm of prostate: Secondary | ICD-10-CM | POA: Diagnosis not present

## 2024-05-02 DIAGNOSIS — Z510 Encounter for antineoplastic radiation therapy: Secondary | ICD-10-CM | POA: Diagnosis not present

## 2024-05-02 DIAGNOSIS — Z191 Hormone sensitive malignancy status: Secondary | ICD-10-CM | POA: Diagnosis not present

## 2024-05-02 LAB — RAD ONC ARIA SESSION SUMMARY
Course Elapsed Days: 28
Plan Fractions Treated to Date: 19
Plan Prescribed Dose Per Fraction: 2.5 Gy
Plan Total Fractions Prescribed: 28
Plan Total Prescribed Dose: 70 Gy
Reference Point Dosage Given to Date: 47.5 Gy
Reference Point Session Dosage Given: 2.5 Gy
Session Number: 19

## 2024-05-03 ENCOUNTER — Other Ambulatory Visit: Payer: Self-pay

## 2024-05-03 ENCOUNTER — Ambulatory Visit
Admission: RE | Admit: 2024-05-03 | Discharge: 2024-05-03 | Disposition: A | Discharge status: Home / Self Care | Source: Ambulatory Visit | Attending: Radiation Oncology | Admitting: Radiation Oncology

## 2024-05-03 DIAGNOSIS — C61 Malignant neoplasm of prostate: Secondary | ICD-10-CM | POA: Diagnosis not present

## 2024-05-03 DIAGNOSIS — Z191 Hormone sensitive malignancy status: Secondary | ICD-10-CM | POA: Diagnosis not present

## 2024-05-03 DIAGNOSIS — Z510 Encounter for antineoplastic radiation therapy: Secondary | ICD-10-CM | POA: Diagnosis not present

## 2024-05-03 LAB — RAD ONC ARIA SESSION SUMMARY
Course Elapsed Days: 29
Plan Fractions Treated to Date: 20
Plan Prescribed Dose Per Fraction: 2.5 Gy
Plan Total Fractions Prescribed: 28
Plan Total Prescribed Dose: 70 Gy
Reference Point Dosage Given to Date: 50 Gy
Reference Point Session Dosage Given: 2.5 Gy
Session Number: 20

## 2024-05-04 ENCOUNTER — Ambulatory Visit

## 2024-05-04 ENCOUNTER — Ambulatory Visit
Admission: RE | Admit: 2024-05-04 | Discharge: 2024-05-04 | Disposition: A | Discharge status: Home / Self Care | Source: Ambulatory Visit | Attending: Radiation Oncology | Admitting: Radiation Oncology

## 2024-05-04 ENCOUNTER — Other Ambulatory Visit: Payer: Self-pay

## 2024-05-04 DIAGNOSIS — Z510 Encounter for antineoplastic radiation therapy: Secondary | ICD-10-CM | POA: Diagnosis not present

## 2024-05-04 DIAGNOSIS — C61 Malignant neoplasm of prostate: Secondary | ICD-10-CM | POA: Diagnosis not present

## 2024-05-04 DIAGNOSIS — Z191 Hormone sensitive malignancy status: Secondary | ICD-10-CM | POA: Diagnosis not present

## 2024-05-04 LAB — RAD ONC ARIA SESSION SUMMARY
Course Elapsed Days: 30
Plan Fractions Treated to Date: 21
Plan Prescribed Dose Per Fraction: 2.5 Gy
Plan Total Fractions Prescribed: 28
Plan Total Prescribed Dose: 70 Gy
Reference Point Dosage Given to Date: 52.5 Gy
Reference Point Session Dosage Given: 2.5 Gy
Session Number: 21

## 2024-05-05 ENCOUNTER — Ambulatory Visit
Admission: RE | Admit: 2024-05-05 | Discharge: 2024-05-05 | Disposition: A | Discharge status: Home / Self Care | Source: Ambulatory Visit | Attending: Radiation Oncology | Admitting: Radiation Oncology

## 2024-05-05 ENCOUNTER — Other Ambulatory Visit: Payer: Self-pay

## 2024-05-05 DIAGNOSIS — Z191 Hormone sensitive malignancy status: Secondary | ICD-10-CM | POA: Diagnosis not present

## 2024-05-05 DIAGNOSIS — C61 Malignant neoplasm of prostate: Secondary | ICD-10-CM | POA: Diagnosis not present

## 2024-05-05 DIAGNOSIS — Z510 Encounter for antineoplastic radiation therapy: Secondary | ICD-10-CM | POA: Diagnosis not present

## 2024-05-05 LAB — RAD ONC ARIA SESSION SUMMARY
Course Elapsed Days: 31
Plan Fractions Treated to Date: 22
Plan Prescribed Dose Per Fraction: 2.5 Gy
Plan Total Fractions Prescribed: 28
Plan Total Prescribed Dose: 70 Gy
Reference Point Dosage Given to Date: 55 Gy
Reference Point Session Dosage Given: 2.5 Gy
Session Number: 22

## 2024-05-08 ENCOUNTER — Ambulatory Visit
Admission: RE | Admit: 2024-05-08 | Discharge: 2024-05-08 | Disposition: A | Discharge status: Home / Self Care | Source: Ambulatory Visit | Attending: Radiation Oncology | Admitting: Radiation Oncology

## 2024-05-08 ENCOUNTER — Other Ambulatory Visit: Payer: Self-pay

## 2024-05-08 DIAGNOSIS — C61 Malignant neoplasm of prostate: Secondary | ICD-10-CM | POA: Diagnosis not present

## 2024-05-08 DIAGNOSIS — Z510 Encounter for antineoplastic radiation therapy: Secondary | ICD-10-CM | POA: Diagnosis not present

## 2024-05-08 DIAGNOSIS — Z191 Hormone sensitive malignancy status: Secondary | ICD-10-CM | POA: Diagnosis not present

## 2024-05-08 LAB — RAD ONC ARIA SESSION SUMMARY
Course Elapsed Days: 34
Plan Fractions Treated to Date: 23
Plan Prescribed Dose Per Fraction: 2.5 Gy
Plan Total Fractions Prescribed: 28
Plan Total Prescribed Dose: 70 Gy
Reference Point Dosage Given to Date: 57.5 Gy
Reference Point Session Dosage Given: 2.5 Gy
Session Number: 23

## 2024-05-09 ENCOUNTER — Ambulatory Visit
Admission: RE | Admit: 2024-05-09 | Discharge: 2024-05-09 | Disposition: A | Discharge status: Home / Self Care | Source: Ambulatory Visit | Attending: Radiation Oncology | Admitting: Radiation Oncology

## 2024-05-09 ENCOUNTER — Other Ambulatory Visit: Payer: Self-pay

## 2024-05-09 DIAGNOSIS — Z191 Hormone sensitive malignancy status: Secondary | ICD-10-CM | POA: Diagnosis not present

## 2024-05-09 DIAGNOSIS — Z510 Encounter for antineoplastic radiation therapy: Secondary | ICD-10-CM | POA: Diagnosis not present

## 2024-05-09 DIAGNOSIS — C61 Malignant neoplasm of prostate: Secondary | ICD-10-CM | POA: Diagnosis not present

## 2024-05-09 LAB — RAD ONC ARIA SESSION SUMMARY
Course Elapsed Days: 35
Plan Fractions Treated to Date: 24
Plan Prescribed Dose Per Fraction: 2.5 Gy
Plan Total Fractions Prescribed: 28
Plan Total Prescribed Dose: 70 Gy
Reference Point Dosage Given to Date: 60 Gy
Reference Point Session Dosage Given: 2.5 Gy
Session Number: 24

## 2024-05-10 ENCOUNTER — Ambulatory Visit
Admission: RE | Admit: 2024-05-10 | Discharge: 2024-05-10 | Disposition: A | Discharge status: Home / Self Care | Source: Ambulatory Visit | Attending: Radiation Oncology | Admitting: Radiation Oncology

## 2024-05-10 ENCOUNTER — Other Ambulatory Visit: Payer: Self-pay

## 2024-05-10 DIAGNOSIS — Z191 Hormone sensitive malignancy status: Secondary | ICD-10-CM | POA: Diagnosis not present

## 2024-05-10 DIAGNOSIS — Z510 Encounter for antineoplastic radiation therapy: Secondary | ICD-10-CM | POA: Diagnosis not present

## 2024-05-10 DIAGNOSIS — C61 Malignant neoplasm of prostate: Secondary | ICD-10-CM | POA: Diagnosis not present

## 2024-05-10 LAB — RAD ONC ARIA SESSION SUMMARY
Course Elapsed Days: 36
Plan Fractions Treated to Date: 25
Plan Prescribed Dose Per Fraction: 2.5 Gy
Plan Total Fractions Prescribed: 28
Plan Total Prescribed Dose: 70 Gy
Reference Point Dosage Given to Date: 62.5 Gy
Reference Point Session Dosage Given: 2.5 Gy
Session Number: 25

## 2024-05-11 ENCOUNTER — Ambulatory Visit

## 2024-05-12 ENCOUNTER — Ambulatory Visit

## 2024-05-12 ENCOUNTER — Other Ambulatory Visit: Payer: Self-pay

## 2024-05-12 ENCOUNTER — Ambulatory Visit
Admission: RE | Admit: 2024-05-12 | Discharge: 2024-05-12 | Disposition: A | Discharge status: Home / Self Care | Source: Ambulatory Visit | Attending: Radiation Oncology | Admitting: Radiation Oncology

## 2024-05-12 DIAGNOSIS — C61 Malignant neoplasm of prostate: Secondary | ICD-10-CM | POA: Diagnosis not present

## 2024-05-12 DIAGNOSIS — Z191 Hormone sensitive malignancy status: Secondary | ICD-10-CM | POA: Diagnosis not present

## 2024-05-12 DIAGNOSIS — Z510 Encounter for antineoplastic radiation therapy: Secondary | ICD-10-CM | POA: Diagnosis not present

## 2024-05-12 LAB — RAD ONC ARIA SESSION SUMMARY
Course Elapsed Days: 38
Plan Fractions Treated to Date: 26
Plan Prescribed Dose Per Fraction: 2.5 Gy
Plan Total Fractions Prescribed: 28
Plan Total Prescribed Dose: 70 Gy
Reference Point Dosage Given to Date: 65 Gy
Reference Point Session Dosage Given: 2.5 Gy
Session Number: 26

## 2024-05-15 ENCOUNTER — Other Ambulatory Visit: Payer: Self-pay

## 2024-05-15 ENCOUNTER — Ambulatory Visit
Admission: RE | Admit: 2024-05-15 | Discharge: 2024-05-15 | Disposition: A | Discharge status: Home / Self Care | Source: Ambulatory Visit | Attending: Radiation Oncology | Admitting: Radiation Oncology

## 2024-05-15 ENCOUNTER — Ambulatory Visit

## 2024-05-15 DIAGNOSIS — Z191 Hormone sensitive malignancy status: Secondary | ICD-10-CM | POA: Diagnosis not present

## 2024-05-15 DIAGNOSIS — C61 Malignant neoplasm of prostate: Secondary | ICD-10-CM | POA: Diagnosis not present

## 2024-05-15 DIAGNOSIS — Z510 Encounter for antineoplastic radiation therapy: Secondary | ICD-10-CM | POA: Diagnosis not present

## 2024-05-15 LAB — RAD ONC ARIA SESSION SUMMARY
Course Elapsed Days: 41
Plan Fractions Treated to Date: 27
Plan Prescribed Dose Per Fraction: 2.5 Gy
Plan Total Fractions Prescribed: 28
Plan Total Prescribed Dose: 70 Gy
Reference Point Dosage Given to Date: 67.5 Gy
Reference Point Session Dosage Given: 2.5 Gy
Session Number: 27

## 2024-05-16 ENCOUNTER — Ambulatory Visit
Admission: RE | Admit: 2024-05-16 | Discharge: 2024-05-16 | Disposition: A | Discharge status: Home / Self Care | Source: Ambulatory Visit | Attending: Radiation Oncology | Admitting: Radiation Oncology

## 2024-05-16 ENCOUNTER — Other Ambulatory Visit: Payer: Self-pay

## 2024-05-16 DIAGNOSIS — Z510 Encounter for antineoplastic radiation therapy: Secondary | ICD-10-CM | POA: Diagnosis not present

## 2024-05-16 DIAGNOSIS — C61 Malignant neoplasm of prostate: Secondary | ICD-10-CM

## 2024-05-16 DIAGNOSIS — Z191 Hormone sensitive malignancy status: Secondary | ICD-10-CM | POA: Diagnosis not present

## 2024-05-16 LAB — RAD ONC ARIA SESSION SUMMARY
Course Elapsed Days: 42
Plan Fractions Treated to Date: 28
Plan Prescribed Dose Per Fraction: 2.5 Gy
Plan Total Fractions Prescribed: 28
Plan Total Prescribed Dose: 70 Gy
Reference Point Dosage Given to Date: 70 Gy
Reference Point Session Dosage Given: 2.5 Gy
Session Number: 28

## 2024-05-19 DIAGNOSIS — C61 Malignant neoplasm of prostate: Secondary | ICD-10-CM | POA: Diagnosis not present

## 2024-05-19 NOTE — Radiation Completion Notes (Addendum)
  Radiation Oncology         [phone]) [phone] ________________________________  Name: Strummer Canipe MRN: [id number]  Date: 05/16/2024  DOB: [birth year]  Referring Physician: VALLI SHANK, M.D. Date of Service: 2024-05-19 Radiation Oncologist: Adina Barge, M.D. [facility]     RADIATION ONCOLOGY END OF TREATMENT NOTE     Diagnosis: 68 y.o. gentleman with Stage T1c adenocarcinoma of the prostate with Gleason score of 3+3, and PSA of 4.82.   Intent: Curative     ==========DELIVERED PLANS==========  First Treatment Date: 2024-04-04 Last Treatment Date: 2024-05-16   Plan Name: Prostate Site: Prostate Technique: IMRT Mode: Photon Dose Per Fraction: 2.5 Gy Prescribed Dose (Delivered / Prescribed): 70 Gy / 70 Gy Prescribed Fxs (Delivered / Prescribed): 28 / 28     ==========ON TREATMENT VISIT DATES========== 2024-04-06, 2024-04-14, 2024-04-24, 2024-05-01, 2024-05-05, 2024-05-12     See weekly On Treatment Notes in Epic for details in the Media tab (listed as Progress notes on the On Treatment Visit Dates listed above).  He tolerated the daily radiation treatments relatively well with some mild increased LUTS that were managed with Flomax  and some modest fatigue.  He denied any abdominal pain or bowel issues.  The patient will receive a call in about one month from the radiation oncology department. He will continue follow up with his urologist, Dr. SHANK, as well.  ------------------------------------------------   Donnice Barge, MD [facility]  Radiation Oncology Direct Dial: [phone]  Fax: [phone] [email or web address]  Skype  LinkedIn

## 2024-05-23 NOTE — Progress Notes (Signed)
 Patient was a RadOnc Consult on 12/07/23 for his stage T1c adenocarcinoma of the prostate with Gleason score of 3+3, and PSA of 4.82.  Patient proceed with treatment recommendations of 5.5 weeks of external beam therapy and had his final radiation treatment on 05/16/24.   Patient is scheduled for a post treatment nurse call on 06/13/24 and has a follow up with urology 05/26/24.

## 2024-05-26 DIAGNOSIS — R350 Frequency of micturition: Secondary | ICD-10-CM | POA: Diagnosis not present

## 2024-05-26 DIAGNOSIS — R3912 Poor urinary stream: Secondary | ICD-10-CM | POA: Diagnosis not present

## 2024-05-26 DIAGNOSIS — N401 Enlarged prostate with lower urinary tract symptoms: Secondary | ICD-10-CM | POA: Diagnosis not present

## 2024-05-26 DIAGNOSIS — C61 Malignant neoplasm of prostate: Secondary | ICD-10-CM | POA: Diagnosis not present

## 2024-06-12 NOTE — Progress Notes (Signed)
  Radiation Oncology         [phone]) [phone] ________________________________  Name: Joshua Stephenson MRN: [id number]  Date of Service: 06/13/2024  DOB: 07/05/1956  Post Treatment Telephone Note  Diagnosis:  68 y.o. gentleman with Stage T1c adenocarcinoma of the prostate with Gleason score of 3+3, and PSA of 4.82.   Pre Treatment IPSS Score:  5  The patient {WAS/WAS [phone]::was not} available for call today.   Symptoms of fatigue {ACTIONS; HAVE/HAVE NOT:19434} improved since completing therapy.  Symptoms of bladder changes {ACTIONS; HAVE/HAVE WNU:80565} improved since completing therapy. Current symptoms include ***, and medications for bladder symptoms include ***.  Symptoms of bowel changes {ACTIONS; HAVE/HAVE NOT:19434} improved since completing therapy. Current symptoms include ***, and medications for bowel symptoms include ***.   Post Treatment IPSS Score:  IPSS Questionnaire (AUA-7) Over the past month.   1)  How often have you had a sensation of not emptying your bladder completely after you finish urinating?  {Rating:19227}  2)  How often have you had to urinate again less than two hours after you finished urinating? {Rating:19227}  3)  How often have you found you stopped and started again several times when you urinated?  {Rating:19227}  4) How difficult have you found it to postpone urination?  {Rating:19227}  5) How often have you had a weak urinary stream?  {Rating:19227}  6) How often have you had to push or strain to begin urination?  {Rating:19227}  7) How many times did you most typically get up to urinate from the time you went to bed until the time you got up in the morning?  {Rating:19228}  Total score:  {0-35:19561} Which indicates {Mild/Moderate/Severe:20260} symptoms  0-7 mildly symptomatic   8-19 moderately symptomatic   20-35 severely symptomatic   Patient (has a scheduled follow up visit with his urologist, Dr. Valli Shank, on ***/ does  not currently have any scheduled follow up with his urologist to his knowledge so he was advised to call [facility] to schedule his post-treatment follow up with Dr. Valli Shank) for ongoing surveillance. He was counseled that PSA levels will be drawn in the urology office, and was reassured that additional time is expected to improve bowel and bladder symptoms. He was encouraged to call back with concerns or questions regarding radiation.

## 2024-06-13 ENCOUNTER — Ambulatory Visit
Admission: RE | Admit: 2024-06-13 | Discharge: 2024-06-13 | Disposition: A | Discharge status: Home / Self Care | Source: Ambulatory Visit | Attending: Radiation Oncology | Admitting: Radiation Oncology

## 2024-06-13 DIAGNOSIS — C61 Malignant neoplasm of prostate: Secondary | ICD-10-CM

## 2024-06-14 ENCOUNTER — Other Ambulatory Visit: Payer: Self-pay | Admitting: Acute Care

## 2024-06-14 DIAGNOSIS — Z122 Encounter for screening for malignant neoplasm of respiratory organs: Secondary | ICD-10-CM

## 2024-06-14 DIAGNOSIS — F17210 Nicotine dependence, cigarettes, uncomplicated: Secondary | ICD-10-CM

## 2024-06-14 DIAGNOSIS — Z87891 Personal history of nicotine dependence: Secondary | ICD-10-CM

## 2024-06-19 ENCOUNTER — Other Ambulatory Visit: Payer: Self-pay | Admitting: Urology

## 2024-06-19 DIAGNOSIS — C61 Malignant neoplasm of prostate: Secondary | ICD-10-CM

## 2024-06-27 ENCOUNTER — Emergency Department (HOSPITAL_COMMUNITY)

## 2024-06-27 ENCOUNTER — Other Ambulatory Visit: Payer: Self-pay

## 2024-06-27 ENCOUNTER — Encounter (HOSPITAL_COMMUNITY): Payer: Self-pay | Admitting: Emergency Medicine

## 2024-06-27 ENCOUNTER — Emergency Department (HOSPITAL_COMMUNITY)
Admission: EM | Admit: 2024-06-27 | Discharge: 2024-06-28 | Disposition: A | Discharge status: Home / Self Care | Attending: Emergency Medicine | Admitting: Emergency Medicine

## 2024-06-27 DIAGNOSIS — I700 Atherosclerosis of aorta: Secondary | ICD-10-CM | POA: Diagnosis not present

## 2024-06-27 DIAGNOSIS — I10 Essential (primary) hypertension: Secondary | ICD-10-CM | POA: Diagnosis not present

## 2024-06-27 DIAGNOSIS — Z79899 Other long term (current) drug therapy: Secondary | ICD-10-CM | POA: Diagnosis not present

## 2024-06-27 DIAGNOSIS — Z7982 Long term (current) use of aspirin: Secondary | ICD-10-CM | POA: Insufficient documentation

## 2024-06-27 DIAGNOSIS — R918 Other nonspecific abnormal finding of lung field: Secondary | ICD-10-CM | POA: Diagnosis not present

## 2024-06-27 DIAGNOSIS — J441 Chronic obstructive pulmonary disease with (acute) exacerbation: Secondary | ICD-10-CM | POA: Insufficient documentation

## 2024-06-27 DIAGNOSIS — J189 Pneumonia, unspecified organism: Secondary | ICD-10-CM | POA: Insufficient documentation

## 2024-06-27 DIAGNOSIS — R0602 Shortness of breath: Secondary | ICD-10-CM | POA: Diagnosis present

## 2024-06-27 DIAGNOSIS — R062 Wheezing: Secondary | ICD-10-CM | POA: Diagnosis not present

## 2024-06-27 LAB — CBC
HCT: 38.1 % — ABNORMAL LOW (ref 39.0–52.0)
Hemoglobin: 13.7 g/dL (ref 13.0–17.0)
MCH: 31.1 pg (ref 26.0–34.0)
MCHC: 36 g/dL (ref 30.0–36.0)
MCV: 86.6 fL (ref 80.0–100.0)
Platelets: 266 K/uL (ref 150–400)
RBC: 4.4 MIL/uL (ref 4.22–5.81)
RDW: 14.8 % (ref 11.5–15.5)
WBC: 6.6 K/uL (ref 4.0–10.5)
nRBC: 0 % (ref 0.0–0.2)

## 2024-06-27 LAB — COMPREHENSIVE METABOLIC PANEL WITH GFR
ALT: 18 U/L (ref 0–44)
AST: 31 U/L (ref 15–41)
Albumin: 4.2 g/dL (ref 3.5–5.0)
Alkaline Phosphatase: 102 U/L (ref 38–126)
Anion gap: 11 (ref 5–15)
BUN: 8 mg/dL (ref 8–23)
CO2: 25 mmol/L (ref 22–32)
Calcium: 9.6 mg/dL (ref 8.9–10.3)
Chloride: 104 mmol/L (ref 98–111)
Creatinine, Ser: 1.13 mg/dL (ref 0.61–1.24)
GFR, Estimated: >60 mL/min (ref >60)
Glucose, Bld: 110 mg/dL — ABNORMAL HIGH (ref 70–99)
Potassium: 3.8 mmol/L (ref 3.5–5.1)
Sodium: 140 mmol/L (ref 135–145)
Total Bilirubin: 0.6 mg/dL (ref 0.0–1.2)
Total Protein: 7.6 g/dL (ref 6.5–8.1)

## 2024-06-27 LAB — PRO BRAIN NATRIURETIC PEPTIDE: Pro Brain Natriuretic Peptide: 124 pg/mL (ref <300.0)

## 2024-06-27 MED ORDER — SODIUM CHLORIDE 0.9 % IV SOLN
1.0000 g | Freq: Once | INTRAVENOUS | Status: AC
  Administered 2024-06-28: 1 g via INTRAVENOUS
  Filled 2024-06-27: qty 10

## 2024-06-27 MED ORDER — ALBUTEROL SULFATE (2.5 MG/3ML) 0.083% IN NEBU
10.0000 mg/h | INHALATION_SOLUTION | Freq: Once | RESPIRATORY_TRACT | Status: AC
  Administered 2024-06-27: 10 mg/h via RESPIRATORY_TRACT
  Filled 2024-06-27: qty 12

## 2024-06-27 MED ORDER — MAGNESIUM SULFATE 2 GM/50ML IV SOLN
2.0000 g | Freq: Once | INTRAVENOUS | Status: AC
  Administered 2024-06-27: 2 g via INTRAVENOUS
  Filled 2024-06-27: qty 50

## 2024-06-27 MED ORDER — AZITHROMYCIN 250 MG PO TABS
500.0000 mg | ORAL_TABLET | Freq: Once | ORAL | Status: AC
  Administered 2024-06-28: 500 mg via ORAL
  Filled 2024-06-27: qty 2

## 2024-06-27 NOTE — ED Notes (Signed)
 Pt ambulated to the bathroom.

## 2024-06-27 NOTE — ED Provider Notes (Signed)
 [facility] AT [facility] Provider Note   CSN: [id number] Arrival date & time: 06/27/24  2100     Patient presents with: Shortness of Breath   Joshua Stephenson is a 68 y.o. male.  {Add pertinent medical, surgical, social history, OB history to YEP:67052}  Shortness of Breath    Patient has a history of asthma, COPD, hypertension.  Patient states he has been having trouble with shortness of breath over the last several weeks.  He has not seen his doctors recently for this.  Patient states he has been using his inhaler a lot, more than prescribed.  He continues to cough and feel short of breath.  He has also noted some episodes of diaphoresis when he is getting short of breath.  He does not have any chest pain.  No leg swelling.  No fevers.  Patient was feeling much worse this evening and he called EMS.  He was given breathing treatments and steroids prior to arrival  Prior to Admission medications   Medication Sig Start Date End Date Taking? Authorizing Provider  albuterol  (PROVENTIL ) (2.5 MG/3ML) 0.083% nebulizer solution Take 3 mLs (2.5 mg total) by nebulization every 6 (six) hours as needed for wheezing or shortness of breath. 04/13/24   Horton, Kristie M, DO  albuterol  (VENTOLIN  HFA) 108 (90 Base) MCG/ACT inhaler Inhale 1 -2 puffs every 4-6 hours as needed- rescue. 04/13/24   Horton, Roxie HERO, DO  aspirin  EC 81 MG tablet Take 1 tablet (81 mg total) by mouth daily. Swallow whole. 06/10/22   Tolia, Sunit, DO  atorvastatin  (LIPITOR) 20 MG tablet TAKE 1 TABLET BY MOUTH EVERYDAY AT BEDTIME 07/26/23   Tolia, Sunit, DO  clonazePAM  (KLONOPIN ) 0.5 MG tablet Take 1 tablet (0.5 mg total) by mouth at bedtime. 03/31/23   Hope Almarie ORN, NP  escitalopram  (LEXAPRO ) 10 MG tablet Take 10 mg by mouth daily. 01/19/23   [provider]  fluconazole  (DIFLUCAN ) 200 MG tablet Take 1 tablet (200 mg total) by mouth daily. 04/06/24   Patrcia Cough, MD   Fluticasone-Umeclidin-Vilant (TRELEGY ELLIPTA ) 200-62.5-25 MCG/ACT AEPB Inhale 1 puff then rinse mouth, once daily 01/10/24   Neysa Rama D, MD  gabapentin  (NEURONTIN ) 300 MG capsule Take 300 mg by mouth 2 (two) times daily.    [provider]  guaiFENesin  (MUCINEX ) 600 MG 12 hr tablet Take 1 tablet (600 mg total) by mouth 2 (two) times daily. 04/13/24   Horton, Roxie HERO, DO  methocarbamol  (ROBAXIN ) 750 MG tablet Take 750 mg by mouth 4 (four) times daily. 12/09/22   [provider]  metoprolol  succinate (TOPROL  XL) 50 MG 24 hr tablet Take 1 tablet (50 mg total) by mouth daily. Take with or immediately following a meal. 07/26/23   Tolia, Sunit, DO  nabumetone (RELAFEN) 750 MG tablet Take 750 mg by mouth 2 (two) times daily. 03/29/23   [provider]  pantoprazole  (PROTONIX ) 40 MG tablet Take 40 mg by mouth 2 (two) times daily.    [provider]  sildenafil (VIAGRA) 100 MG tablet Take 100 mg by mouth as needed for erectile dysfunction. 09/10/23   [provider]  tamsulosin  (FLOMAX ) 0.4 MG CAPS capsule Take 1 capsule (0.4 mg total) by mouth daily after supper. 05/01/24   Patrcia Cough, MD  dicyclomine  (BENTYL ) 20 MG tablet Take 1 tablet (20 mg total) by mouth 4 (four) times daily -  before meals and at bedtime. As needed for cramps and diarrhea 04/04/18 02/25/20  Avram Lupita BRAVO, MD  triazolam  (HALCION ) 0.25 MG tablet Take 1 tab by mouth 1 hour prior to procedure with very light food, do not drive motor vehicle. 06/23/18 02/25/20  Eldonna Novel, MD    Allergies: Patient has no known allergies.    Review of Systems  Respiratory:  Positive for shortness of breath.     Updated Vital Signs BP (!) 171/94 (BP Location: Right Arm)   Pulse 92   Temp 98.2 F (36.8 C) (Oral)   Resp (!) 35   Ht 1.702 m (5' 7)   Wt 74.4 kg   SpO2 94%   BMI 25.69 kg/m   Physical Exam Vitals and nursing note reviewed.  Constitutional:      General: He is not in acute  distress.    Appearance: He is well-developed.  HENT:     Head: Normocephalic and atraumatic.     Right Ear: External ear normal.     Left Ear: External ear normal.  Eyes:     General: No scleral icterus.       Right eye: No discharge.        Left eye: No discharge.     Conjunctiva/sclera: Conjunctivae normal.  Neck:     Trachea: No tracheal deviation.  Cardiovascular:     Rate and Rhythm: Normal rate and regular rhythm.  Pulmonary:     Effort: Pulmonary effort is normal. No respiratory distress.     Breath sounds: No stridor. Wheezing present. No rales.  Abdominal:     General: Bowel sounds are normal. There is no distension.     Palpations: Abdomen is soft.     Tenderness: There is no abdominal tenderness. There is no guarding or rebound.  Musculoskeletal:        General: No tenderness or deformity.     Cervical back: Neck supple.  Skin:    General: Skin is warm and dry.     Findings: No rash.  Neurological:     General: No focal deficit present.     Mental Status: He is alert.     Cranial Nerves: No cranial nerve deficit, dysarthria or facial asymmetry.     Sensory: No sensory deficit.     Motor: No abnormal muscle tone or seizure activity.     Coordination: Coordination normal.  Psychiatric:        Mood and Affect: Mood normal.     (all labs ordered are listed, but only abnormal results are displayed) Labs Reviewed  COMPREHENSIVE METABOLIC PANEL WITH GFR  CBC  PRO BRAIN NATRIURETIC PEPTIDE    EKG: EKG Interpretation Date/Time:  Tuesday June 27 2024 21:14:05 EDT Ventricular Rate:  95 PR Interval:  177 QRS Duration:  82 QT Interval:  355 QTC Calculation: 447 R Axis:   -51  Text Interpretation: Sinus rhythm Multiple ventricular premature complexes Left atrial enlargement Left axis deviation Anterior infarct, old Since last tracing rate slower Confirmed by Randol Simmonds [phone]) on 06/27/2024 10:07:28 PM  Radiology: No results found.  {Document cardiac  monitor, telemetry assessment procedure when appropriate:32947} Procedures   Medications Ordered in the ED  magnesium  sulfate IVPB 2 g 50 mL (has no administration in time range)  albuterol  (PROVENTIL ,VENTOLIN ) solution continuous neb (has no administration in time range)      {Click here for ABCD2, HEART and other calculators REFRESH Note before signing:1}  Medical Decision Making Amount and/or Complexity of Data Reviewed Labs: ordered. Radiology: ordered.  Risk Prescription drug management.   ***  {Document critical care time when appropriate  Document review of labs and clinical decision tools ie CHADS2VASC2, etc  Document your independent review of radiology images and any outside records  Document your discussion with family members, caretakers and with consultants  Document social determinants of health affecting pt's care  Document your decision making why or why not admission, treatments were needed:32947:::1}   Final diagnoses:  None    ED Discharge Orders     None

## 2024-06-27 NOTE — ED Triage Notes (Addendum)
 Patient BIB EMS for evaluation of SHOB.  Reports symptoms started a few weeks ago and have progressively gotten worsen.  Was have increased difficulty breathing tonight and called EMS.  Given Albuterol  10 mg, Atrovent  0.5 mg, and SoluMedrol 125 mg IV PTA.  No reports of fever.    Patient reports he was seen in July for same symptoms.  States he does not feel like he fully recovered from respiratory symptoms then.  Has continued to have cough.  Unable to lay down at night to sleep.  Has a non-productive cough

## 2024-06-28 DIAGNOSIS — J441 Chronic obstructive pulmonary disease with (acute) exacerbation: Secondary | ICD-10-CM | POA: Diagnosis not present

## 2024-06-28 MED ORDER — PREDNISONE 10 MG PO TABS: 40.0000 mg | ORAL_TABLET | Freq: Every day | ORAL | 0 refills | Status: DC

## 2024-06-28 MED ORDER — DOXYCYCLINE HYCLATE 100 MG PO CAPS: 100.0000 mg | ORAL_CAPSULE | Freq: Two times a day (BID) | ORAL | 0 refills | Status: DC

## 2024-06-28 NOTE — ED Notes (Signed)
 While ambulating pt oximetry stayed at 97 the majority of the time it did drop to 95 once but it went right back to 97. Pt states that he did not feel out of breath or any dizziness while ambulating.

## 2024-06-28 NOTE — Discharge Instructions (Signed)
 You were seen today for shortness of breath.  You will be treated for COPD exacerbation as well as potentially early pneumonia.  Take antibiotics as prescribed.  Continue course of steroids and albuterol  at home.  Follow-up closely with your primary doctor.

## 2024-06-28 NOTE — ED Provider Notes (Signed)
 This is a 68 year old male who is signed out pending reassessment.  In brief presenting with shortness of breath.  Presumed COPD exacerbation.  Received continuous albuterol , Solu-Medrol , magnesium .  Chest x-ray shows potential early pneumonia.  He was treated with Rocephin  and azithromycin.  On my recheck he is resting comfortably.  No wheeze on exam.  He is able to ambulate maintain his O2 sats 97% or greater.  He is in no respiratory distress.  He feels comfortable going home.  Will discharge with a course of prednisone  and doxycycline  for presumed early pneumonia.  He has an inhaler at home. Physical Exam  BP (!) 143/85 (BP Location: Right Arm)   Pulse 87   Temp 98.3 F (36.8 C) (Oral)   Resp 20   Ht 1.702 m (5' 7)   Wt 74.4 kg   SpO2 95%   BMI 25.69 kg/m   Physical Exam Awake and alert No respiratory distress, occasional expiratory wheeze Procedures  Procedures  ED Course / MDM   Clinical Course as of 06/28/24 0201  Tue Jun 27, 2024  2330 CBC metabolic panel normal.  BNP normal [JK]    Clinical Course User Index [JK] Randol Simmonds, MD   Medical Decision Making Amount and/or Complexity of Data Reviewed Labs: ordered. Radiology: ordered.  Risk Prescription drug management.   Problem List Items Addressed This Visit   None Visit Diagnoses       Chronic obstructive pulmonary disease with acute exacerbation (HCC)    -  Primary   Relevant Medications   albuterol  (PROVENTIL ) (2.5 MG/3ML) 0.083% nebulizer solution (Completed)   azithromycin (ZITHROMAX) tablet 500 mg (Completed)   predniSONE  (DELTASONE ) 10 MG tablet     Community acquired pneumonia, unspecified laterality       Relevant Medications   albuterol  (PROVENTIL ) (2.5 MG/3ML) 0.083% nebulizer solution (Completed)   cefTRIAXone  (ROCEPHIN ) 1 g in sodium chloride  0.9 % 100 mL IVPB (Completed)   azithromycin (ZITHROMAX) tablet 500 mg (Completed)   doxycycline  (VIBRAMYCIN ) 100 MG capsule             Bari Charmaine FALCON, MD 06/28/24 0202

## 2024-06-28 NOTE — ED Notes (Signed)
 Patient ambulated to bathroom.

## 2024-06-29 ENCOUNTER — Ambulatory Visit: Payer: Self-pay

## 2024-06-29 NOTE — Telephone Encounter (Signed)
 FYI Only or Action Required?: FYI only for provider.  Patient is followed in Pulmonology for COPD asthma, last seen on 01/10/2024 by Joshua Reggy BIRCH, MD.  Called Nurse Triage reporting Shortness of Breath.  Symptoms began about a month ago.  Interventions attempted: Prescription medications: prednisone ; doxycycline , Rescue inhaler, Maintenance inhaler, and Nebulizer treatments.  Symptoms are: unchanged.  Triage Disposition: See PCP Within 2 Weeks  Patient/caregiver understands and will follow disposition?: Yes  Copied from CRM [id number]. Topic: Clinical - Red Word Triage >> Jun 29, 2024 11:21 AM Joshua Stephenson wrote: Red Word that prompted transfer to Nurse Triage: Shortness of breath. Reason for Disposition  [1] MILD longstanding difficulty breathing (e.g., minimal/no SOB at rest, SOB with walking, pulse < 100) AND [2] SAME as normal  Answer Assessment - Initial Assessment Questions Pt seen in ED two days ago and dx with early noticed PNA. Dc'd with doxycycline  and prednisone . Hx COPD and has been dealing w/ SOB w/ exertion for about a month now. States he does not feel any better and would like to schedule with pulm.   1. RESPIRATORY STATUS: Describe your breathing? (e.g., wheezing, shortness of breath, unable to speak, severe coughing)      SOB occasionally while not doing anything; SOB with any exertion 2. ONSET: When did this breathing problem begin?      1 month worse the last few days 3. PATTERN Does the difficult breathing come and go, or has it been constant since it started?      intermittent 4. SEVERITY: How bad is your breathing? (e.g., mild, moderate, severe)      moderate 5. RECURRENT SYMPTOM: Have you had difficulty breathing before? If Yes, ask: When was the last time? and What happened that time?      Yes copd asthma 6. CARDIAC HISTORY: Do you have any history of heart disease? (e.g., heart attack, angina, bypass surgery, angioplasty)      denies 7.  LUNG HISTORY: Do you have any history of lung disease?  (e.g., pulmonary embolus, asthma, emphysema)     Asthma COPD 8. CAUSE: What do you think is causing the breathing problem?      Copd asthma 9. OTHER SYMPTOMS: Do you have any other symptoms? (e.g., chest pain, cough, dizziness, fever, runny nose)     Cough occasionally with exertion 10. O2 SATURATION MONITOR:  Do you use an oxygen saturation monitor (pulse oximeter) at home? If Yes, ask: What is your reading (oxygen level) today? What is your usual oxygen saturation reading? (e.g., 95%)       denies  12. TRAVEL: Have you traveled out of the country in the last month? (e.g., travel history, exposures)       denies  Protocols used: Breathing Difficulty-A-AH

## 2024-06-30 ENCOUNTER — Ambulatory Visit (HOSPITAL_BASED_OUTPATIENT_CLINIC_OR_DEPARTMENT_OTHER)

## 2024-06-30 ENCOUNTER — Ambulatory Visit (HOSPITAL_BASED_OUTPATIENT_CLINIC_OR_DEPARTMENT_OTHER): Admitting: Pulmonary Disease

## 2024-06-30 ENCOUNTER — Encounter (HOSPITAL_BASED_OUTPATIENT_CLINIC_OR_DEPARTMENT_OTHER): Payer: Self-pay | Admitting: Pulmonary Disease

## 2024-06-30 VITALS — BP 136/84 | HR 78 | Ht 67.0 in | Wt 168.6 lb

## 2024-06-30 DIAGNOSIS — J189 Pneumonia, unspecified organism: Secondary | ICD-10-CM | POA: Diagnosis not present

## 2024-06-30 DIAGNOSIS — J441 Chronic obstructive pulmonary disease with (acute) exacerbation: Secondary | ICD-10-CM

## 2024-06-30 DIAGNOSIS — Z0389 Encounter for observation for other suspected diseases and conditions ruled out: Secondary | ICD-10-CM | POA: Diagnosis not present

## 2024-06-30 DIAGNOSIS — J4489 Other specified chronic obstructive pulmonary disease: Secondary | ICD-10-CM

## 2024-06-30 MED ORDER — PREDNISONE 10 MG PO TABS: ORAL_TABLET | ORAL | 0 refills | Status: DC

## 2024-06-30 MED ORDER — ALBUTEROL SULFATE (2.5 MG/3ML) 0.083% IN NEBU: 2.5000 mg | INHALATION_SOLUTION | Freq: Four times a day (QID) | RESPIRATORY_TRACT | 3 refills | Status: DC | PRN

## 2024-06-30 NOTE — Progress Notes (Signed)
 Subjective:    Patient ID: Joshua Stephenson, male    DOB: [birth year], 68 y.o.   MRN: [id number]   4 yoM Smoker (20pkyrs) followed for OSA, Lung Nodule, COPD  -not motivated to use CPAP   PMH :  Prostate cancer, HTN, GERD, Cervical Radiculopathy, Depression, CAD,   Discussed the use of AI scribe software for clinical note transcription with the patient, who gave verbal consent to proceed.  History of Present Illness Joshua Stephenson is a 68 year old male with COPD who presents with worsening respiratory symptoms. He is accompanied by his wife. He normally sees Dr. Neysa.  He has experienced a persistent cough and shortness of breath for about a month. On October 7th, he had severe dyspnea and tachycardia after climbing stairs, requiring emergency medical care. He received nebulizer treatments, prednisone , and an antibiotic.-doxy  Despite completing the medication course, he continues to have a strong cough and occasional mucus production. Symptoms worsen with physical activity and weather changes, and he sometimes wakes at night with a deep cough and sweating.  He uses Trelegy twice daily instead of once and uses albuterol  as needed, though it is sometimes ineffective without the nebulizer.  He quit smoking in 2002 after starting in his mid-teens and was a heavy drinker in the past. He recalls prior pulmonary function tests.  No runny nose or sore throat prior to the onset of current symptoms, except when unable to breathe and coughing causes nasal discharge.   ED record reviewed  CXR s/o RLL early infx    CTA 03/2024 >> stable RUL nodule  Significant tests/ events reviewed  HST 01/26/21- AHI 9.3/ hr, desaturation to 86%, body weight 187 lbs CPAP auto 5-15/ [employer]   ordered 06/27/21 PFT 04/30/21-minimal obstruction, insignif resp to BD  FEV1/FVC 0.75 PFT 01/03/24- FEV1 53% ,moderately severe COPD w/o resp to BD. Nl lung volumes and DLCO.  Review of Systems  neg for any  significant sore throat, dysphagia, itching, sneezing, nasal congestion or excess/ purulent secretions, fever, chills, sweats, unintended wt loss, pleuritic or exertional cp, hempoptysis, orthopnea pnd or change in chronic leg swelling. Also denies presyncope, palpitations, heartburn, abdominal pain, nausea, vomiting, diarrhea or change in bowel or urinary habits, dysuria,hematuria, rash, arthralgias, visual complaints, headache, numbness weakness or ataxia.      Objective:   Physical Exam   Gen. Pleasant, , in no distress ENT - no lesions, no post nasal drip Neck: No JVD, no thyromegaly, no carotid bruits Lungs: no use of accessory muscles, no dullness to percussion, decreased without rales or rhonchi  Cardiovascular: Rhythm regular, heart sounds  normal, no murmurs or gallops, no peripheral edema Musculoskeletal: No deformities, no cyanosis or clubbing , no tremors        Assessment & Plan:   Assessment and Plan Assessment & Plan Chronic obstructive pulmonary disease (COPD) with acute exacerbation Acute exacerbation of COPD characterized by persistent cough, wheezing, and dyspnea for approximately one month, with a recent severe episode necessitating emergency care. Lung function is significantly reduced with a capacity of 55%. Exacerbation likely triggered by environmental factors or a respiratory infection. Current treatment includes Trelegy and albuterol , with recent use of prednisone  and antibiotics. Excessive use of Trelegy noted, potentially contributing to symptoms such as tachycardia. - Continue Trelegy once daily. - Use albuterol  every four hours as needed for dyspnea. - Prescribe additional prednisone  to complete the course. - Educate on proper use of Trelegy and albuterol . - Discuss potential need for additional  medications or injections if exacerbations persist.  Suspected pneumonia Suspected pneumonia based on symptoms and initial chest x-ray findings, though  confirmation is pending. Recent treatment with antibiotics and prednisone  has led to symptom improvement, but not full resolution. Initial x-ray suggested possible pneumonia, necessitating further imaging to confirm resolution or progression. - Order repeat chest x-ray to assess for resolution of pneumonia. - Consider stronger antibiotics if x-ray shows worsening pneumonia. - Monitor symptoms and adjust treatment as necessary.

## 2024-06-30 NOTE — Addendum Note (Signed)
 Addended by: JUDE DONNING V on: 06/30/2024 12:08 PM   Modules accepted: Orders

## 2024-06-30 NOTE — Patient Instructions (Addendum)
 X CXR today  X Prednisone  10 mg tabs  Take 2 tabs daily with food x 5ds, then 1 tab daily with food x 5ds then STOP   VISIT SUMMARY: You visited today due to worsening respiratory symptoms related to your COPD. You have been experiencing a persistent cough and shortness of breath for about a month, with a severe episode earlier this month that required emergency care. Despite completing a course of medication, your symptoms have not fully resolved.  YOUR PLAN: -CHRONIC OBSTRUCTIVE PULMONARY DISEASE (COPD) WITH ACUTE EXACERBATION: COPD is a chronic lung disease that makes it hard to breathe. You are currently experiencing an acute exacerbation, which means your symptoms have suddenly worsened. Continue using Trelegy once daily and albuterol  every four hours as needed for shortness of breath. You will also complete an additional course of prednisone . Proper use of your medications is important, and we discussed the possibility of needing additional treatments if your symptoms do not improve.  -SUSPECTED PNEUMONIA: Pneumonia is an infection that inflames the air sacs in one or both lungs. Based on your symptoms and initial chest x-ray, we suspect you might have pneumonia. We will order a repeat chest x-ray to see if the pneumonia has resolved or worsened. If it has worsened, we may need to prescribe stronger antibiotics. We will monitor your symptoms and adjust your treatment as needed.  INSTRUCTIONS: Please schedule a follow-up appointment after completing the additional course of prednisone . Also, ensure you get the repeat chest x-ray as ordered to assess the status of the suspected pneumonia.                      Contains text generated by Abridge.                                 Contains text generated by Abridge.

## 2024-07-03 ENCOUNTER — Ambulatory Visit: Payer: Self-pay | Admitting: Pulmonary Disease

## 2024-07-03 DIAGNOSIS — I10 Essential (primary) hypertension: Secondary | ICD-10-CM | POA: Diagnosis not present

## 2024-07-03 DIAGNOSIS — J441 Chronic obstructive pulmonary disease with (acute) exacerbation: Secondary | ICD-10-CM | POA: Diagnosis not present

## 2024-07-03 DIAGNOSIS — I25118 Atherosclerotic heart disease of native coronary artery with other forms of angina pectoris: Secondary | ICD-10-CM | POA: Diagnosis not present

## 2024-07-03 DIAGNOSIS — Z720 Tobacco use: Secondary | ICD-10-CM | POA: Diagnosis not present

## 2024-07-05 DIAGNOSIS — E785 Hyperlipidemia, unspecified: Secondary | ICD-10-CM | POA: Diagnosis not present

## 2024-07-05 DIAGNOSIS — Z7952 Long term (current) use of systemic steroids: Secondary | ICD-10-CM | POA: Diagnosis not present

## 2024-07-05 DIAGNOSIS — I10 Essential (primary) hypertension: Secondary | ICD-10-CM | POA: Diagnosis not present

## 2024-07-05 DIAGNOSIS — M4800 Spinal stenosis, site unspecified: Secondary | ICD-10-CM | POA: Diagnosis not present

## 2024-07-05 DIAGNOSIS — G4733 Obstructive sleep apnea (adult) (pediatric): Secondary | ICD-10-CM | POA: Diagnosis not present

## 2024-07-05 DIAGNOSIS — I2510 Atherosclerotic heart disease of native coronary artery without angina pectoris: Secondary | ICD-10-CM | POA: Diagnosis not present

## 2024-07-05 DIAGNOSIS — F411 Generalized anxiety disorder: Secondary | ICD-10-CM | POA: Diagnosis not present

## 2024-07-05 DIAGNOSIS — N400 Enlarged prostate without lower urinary tract symptoms: Secondary | ICD-10-CM | POA: Diagnosis not present

## 2024-07-05 DIAGNOSIS — M545 Low back pain, unspecified: Secondary | ICD-10-CM | POA: Diagnosis not present

## 2024-07-05 DIAGNOSIS — Z59869 Financial insecurity, unspecified: Secondary | ICD-10-CM | POA: Diagnosis not present

## 2024-07-05 DIAGNOSIS — J441 Chronic obstructive pulmonary disease with (acute) exacerbation: Secondary | ICD-10-CM | POA: Diagnosis not present

## 2024-07-05 DIAGNOSIS — R7303 Prediabetes: Secondary | ICD-10-CM | POA: Diagnosis not present

## 2024-07-24 ENCOUNTER — Other Ambulatory Visit: Payer: Self-pay | Admitting: Internal Medicine

## 2024-07-24 ENCOUNTER — Other Ambulatory Visit: Payer: Self-pay

## 2024-07-24 MED ORDER — TRELEGY ELLIPTA 200-62.5-25 MCG/ACT IN AEPB: INHALATION_SPRAY | RESPIRATORY_TRACT | 4 refills | Status: DC

## 2024-07-24 NOTE — Telephone Encounter (Signed)
 Copied from CRM #[id number]. Topic: Clinical - Medication Question >> Jul 24, 2024  1:33 PM [name]: Reason for CRM: Patient is calling to request a Trelegy refill be sent to [facility] for pickup, as he is out of it now and needs it now. Patient states if this cannot happen today, he will go to the ER to have it filled instead.  Rx sent and pt is aware.

## 2024-07-30 ENCOUNTER — Other Ambulatory Visit: Payer: Self-pay

## 2024-07-30 ENCOUNTER — Encounter (HOSPITAL_COMMUNITY): Payer: Self-pay | Admitting: *Deleted

## 2024-07-30 ENCOUNTER — Emergency Department (HOSPITAL_COMMUNITY)
Admission: EM | Admit: 2024-07-30 | Discharge: 2024-07-30 | Disposition: A | Discharge status: Home / Self Care | Attending: Emergency Medicine | Admitting: Emergency Medicine

## 2024-07-30 ENCOUNTER — Emergency Department (HOSPITAL_COMMUNITY)

## 2024-07-30 DIAGNOSIS — Z7951 Long term (current) use of inhaled steroids: Secondary | ICD-10-CM | POA: Diagnosis not present

## 2024-07-30 DIAGNOSIS — J441 Chronic obstructive pulmonary disease with (acute) exacerbation: Secondary | ICD-10-CM | POA: Insufficient documentation

## 2024-07-30 DIAGNOSIS — R059 Cough, unspecified: Secondary | ICD-10-CM | POA: Diagnosis not present

## 2024-07-30 DIAGNOSIS — R0602 Shortness of breath: Secondary | ICD-10-CM | POA: Diagnosis not present

## 2024-07-30 DIAGNOSIS — Z981 Arthrodesis status: Secondary | ICD-10-CM | POA: Diagnosis not present

## 2024-07-30 DIAGNOSIS — Z7982 Long term (current) use of aspirin: Secondary | ICD-10-CM | POA: Insufficient documentation

## 2024-07-30 DIAGNOSIS — J449 Chronic obstructive pulmonary disease, unspecified: Secondary | ICD-10-CM | POA: Diagnosis not present

## 2024-07-30 LAB — CBC WITH DIFFERENTIAL/PLATELET
Abs Immature Granulocytes: 0.04 K/uL (ref 0.00–0.07)
Basophils Absolute: 0 K/uL (ref 0.0–0.1)
Basophils Relative: 1 %
Eosinophils Absolute: 0.4 K/uL (ref 0.0–0.5)
Eosinophils Relative: 6 %
HCT: 38 % — ABNORMAL LOW (ref 39.0–52.0)
Hemoglobin: 13.4 g/dL (ref 13.0–17.0)
Immature Granulocytes: 1 %
Lymphocytes Relative: 28 %
Lymphs Abs: 1.8 K/uL (ref 0.7–4.0)
MCH: 30.8 pg (ref 26.0–34.0)
MCHC: 35.3 g/dL (ref 30.0–36.0)
MCV: 87.4 fL (ref 80.0–100.0)
Monocytes Absolute: 1 K/uL (ref 0.1–1.0)
Monocytes Relative: 16 %
Neutro Abs: 3.1 K/uL (ref 1.7–7.7)
Neutrophils Relative %: 48 %
Platelets: 240 K/uL (ref 150–400)
RBC: 4.35 MIL/uL (ref 4.22–5.81)
RDW: 13.5 % (ref 11.5–15.5)
WBC: 6.4 K/uL (ref 4.0–10.5)
nRBC: 0 % (ref 0.0–0.2)

## 2024-07-30 LAB — BASIC METABOLIC PANEL WITH GFR
Anion gap: 10 (ref 5–15)
BUN: 8 mg/dL (ref 8–23)
CO2: 27 mmol/L (ref 22–32)
Calcium: 9.4 mg/dL (ref 8.9–10.3)
Chloride: 101 mmol/L (ref 98–111)
Creatinine, Ser: 1.1 mg/dL (ref 0.61–1.24)
GFR, Estimated: >60 mL/min (ref >60)
Glucose, Bld: 143 mg/dL — ABNORMAL HIGH (ref 70–99)
Potassium: 3.5 mmol/L (ref 3.5–5.1)
Sodium: 138 mmol/L (ref 135–145)

## 2024-07-30 LAB — RESP PANEL BY RT-PCR (RSV, FLU A&B, COVID)  RVPGX2
Influenza A by PCR: NEGATIVE
Influenza B by PCR: NEGATIVE
Resp Syncytial Virus by PCR: NEGATIVE
SARS Coronavirus 2 by RT PCR: NEGATIVE

## 2024-07-30 MED ORDER — ALBUTEROL SULFATE HFA 108 (90 BASE) MCG/ACT IN AERS
2.0000 | INHALATION_SPRAY | Freq: Once | RESPIRATORY_TRACT | Status: AC
  Administered 2024-07-30: 2 via RESPIRATORY_TRACT
  Filled 2024-07-30: qty 6.7

## 2024-07-30 MED ORDER — PREDNISONE 10 MG (21) PO TBPK: ORAL_TABLET | Freq: Every day | ORAL | 0 refills | Status: DC

## 2024-07-30 MED ORDER — IPRATROPIUM-ALBUTEROL 0.5-2.5 (3) MG/3ML IN SOLN
3.0000 mL | Freq: Once | RESPIRATORY_TRACT | Status: AC
  Administered 2024-07-30: 3 mL via RESPIRATORY_TRACT
  Filled 2024-07-30: qty 3

## 2024-07-30 MED ORDER — AEROCHAMBER PLUS FLO-VU MEDIUM MISC
1.0000 | Freq: Once | Status: AC
Start: 2024-07-30 — End: 2024-07-30
  Administered 2024-07-30: 1

## 2024-07-30 MED ORDER — PROMETHAZINE-DM 6.25-15 MG/5ML PO SYRP: 5.0000 mL | ORAL_SOLUTION | Freq: Four times a day (QID) | ORAL | 0 refills | Status: DC | PRN

## 2024-07-30 MED ORDER — METHYLPREDNISOLONE SODIUM SUCC 125 MG IJ SOLR
125.0000 mg | INTRAMUSCULAR | Status: AC
  Administered 2024-07-30: 125 mg via INTRAVENOUS
  Filled 2024-07-30: qty 2

## 2024-07-30 NOTE — Discharge Instructions (Addendum)
 Take prednisone  as prescribed.  Follow-up with your primary care doctor and your pulmonologist.  I really specifically want you to talk to them about whether or not you should be using CPAP at night because I think you probably should be.  Also happy belated birthday I am sorry I did not notice earlier.

## 2024-07-30 NOTE — ED Provider Notes (Signed)
 [place] EMERGENCY DEPARTMENT AT [facility] Provider Note   CSN: [id number] Arrival date & time: 07/30/24  9262     Patient presents with: Shortness of Breath   Joshua Stephenson is a 68 y.o. male.  {Add pertinent medical, surgical, social history, OB history to HPI:32947}  Shortness of Breath      Prior to Admission medications   Medication Sig Start Date End Date Taking? Authorizing Provider  albuterol  (PROVENTIL ) (2.5 MG/3ML) 0.083% nebulizer solution Take 3 mLs (2.5 mg total) by nebulization every 6 (six) hours as needed for wheezing or shortness of breath. 06/30/24   Jude Harden GAILS, MD  albuterol  (VENTOLIN  HFA) 108 (90 Base) MCG/ACT inhaler Inhale 1 -2 puffs every 4-6 hours as needed- rescue. 04/13/24   Horton, Roxie HERO, DO  aspirin  EC 81 MG tablet Take 1 tablet (81 mg total) by mouth daily. Swallow whole. 06/10/22   Tolia, Sunit, DO  atorvastatin  (LIPITOR) 20 MG tablet TAKE 1 TABLET BY MOUTH EVERYDAY AT BEDTIME 07/26/23   Tolia, Sunit, DO  clonazePAM  (KLONOPIN ) 0.5 MG tablet Take 1 tablet (0.5 mg total) by mouth at bedtime. 03/31/23   Hope Almarie ORN, NP  doxycycline  (VIBRAMYCIN ) 100 MG capsule Take 1 capsule (100 mg total) by mouth 2 (two) times daily. 06/28/24   Horton, Charmaine FALCON, MD  escitalopram  (LEXAPRO ) 10 MG tablet Take 10 mg by mouth daily. 01/19/23   [provider]  fluconazole  (DIFLUCAN ) 200 MG tablet Take 1 tablet (200 mg total) by mouth daily. Patient not taking: Reported on 06/30/2024 04/06/24   Patrcia Cough, MD  Fluticasone-Umeclidin-Vilant (TRELEGY ELLIPTA ) 200-62.5-25 MCG/ACT AEPB Inhale 1 puff then rinse mouth, once daily 07/24/24   Alva, Rakesh V, MD  gabapentin  (NEURONTIN ) 300 MG capsule Take 300 mg by mouth 2 (two) times daily. Patient not taking: Reported on 06/30/2024    [provider]  guaiFENesin  (MUCINEX ) 600 MG 12 hr tablet Take 1 tablet (600 mg total) by mouth 2 (two) times daily. 04/13/24   Horton, Roxie HERO,  DO  methocarbamol  (ROBAXIN ) 750 MG tablet Take 750 mg by mouth 4 (four) times daily. Patient not taking: Reported on 06/30/2024 12/09/22   [provider]  metoprolol  succinate (TOPROL  XL) 50 MG 24 hr tablet Take 1 tablet (50 mg total) by mouth daily. Take with or immediately following a meal. 07/26/23   Tolia, Sunit, DO  nabumetone (RELAFEN) 750 MG tablet Take 750 mg by mouth 2 (two) times daily. Patient not taking: Reported on 06/30/2024 03/29/23   [provider]  pantoprazole  (PROTONIX ) 40 MG tablet Take 40 mg by mouth 2 (two) times daily. Patient not taking: Reported on 06/30/2024    [provider]  predniSONE  (DELTASONE ) 10 MG tablet Take 4 tablets (40 mg total) by mouth daily. 06/28/24   Horton, Charmaine FALCON, MD  predniSONE  (DELTASONE ) 10 MG tablet Prednisone  10 mg tabs  Take 2 tabs daily with food x 5ds, then 1 tab daily with food x 5ds then STOP 06/30/24   Alva, Rakesh V, MD  sildenafil (VIAGRA) 100 MG tablet Take 100 mg by mouth as needed for erectile dysfunction. Patient not taking: Reported on 06/30/2024 09/10/23   [provider]  tamsulosin  (FLOMAX ) 0.4 MG CAPS capsule Take 1 capsule (0.4 mg total) by mouth daily after supper. 05/01/24   Patrcia Cough, MD  dicyclomine  (BENTYL ) 20 MG tablet Take 1 tablet (20 mg total) by mouth 4 (four) times daily -  before meals and at bedtime. As needed  for cramps and diarrhea 04/04/18 02/25/20  Avram Lupita BRAVO, MD  triazolam  (HALCION ) 0.25 MG tablet Take 1 tab by mouth 1 hour prior to procedure with very light food, do not drive motor vehicle. 06/23/18 02/25/20  Eldonna Novel, MD    Allergies: Patient has no known allergies.    Review of Systems  Respiratory:  Positive for shortness of breath.     Updated Vital Signs BP (!) 126/92   Pulse 87   Temp 98.3 F (36.8 C)   Resp 14   Ht 5' 7 (1.702 m)   Wt 76.2 kg   SpO2 96%   BMI 26.31 kg/m   Physical Exam  (all labs ordered are listed, but only abnormal  results are displayed) Labs Reviewed  BASIC METABOLIC PANEL WITH GFR - Abnormal; Notable for the following components:      Result Value   Glucose, Bld 143 (*)    All other components within normal limits  CBC WITH DIFFERENTIAL/PLATELET - Abnormal; Notable for the following components:   HCT 38.0 (*)    All other components within normal limits  RESP PANEL BY RT-PCR (RSV, FLU A&B, COVID)  [id number]    EKG: None  Radiology: DG Chest 2 View Result Date: 07/30/2024 EXAM: 2 VIEW(S) XRAY OF THE CHEST 07/30/2024 08:20:00 AM COMPARISON: 06/30/2024 and CTA chest 04/12/2024. 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 No acute lung opacity. No pulmonary edema. No pleural effusion. No pneumothorax. HEART AND MEDIASTINUM: Aortic arch calcifications. BONES AND SOFT TISSUES: Partial visualization of ACDF hardware overlying lower cervical spine. Right humeral head tendon suture anchors noted. Mild-to-moderate multilevel degenerative disc changes of thoracic spine. IMPRESSION: 1. No acute cardiopulmonary process. Electronically signed by: Helayne Hurst MD 07/30/2024 08:42 AM EST RP Workstation: [id number]    {Document cardiac monitor, telemetry assessment procedure when appropriate:32947} Procedures   Medications Ordered in the ED  ipratropium-albuterol  (DUONEB) 0.5-2.5 (3) MG/3ML nebulizer solution 3 mL (3 mLs Nebulization Given 07/30/24 0823)  ipratropium-albuterol  (DUONEB) 0.5-2.5 (3) MG/3ML nebulizer solution 3 mL (3 mLs Nebulization Given 07/30/24 0823)  methylPREDNISolone  sodium succinate (SOLU-MEDROL ) 125 mg/2 mL injection 125 mg (125 mg Intravenous Given 07/30/24 0815)    Clinical Course as of 07/30/24 0917  Sun Jul 30, 2024  0750 COPD hx - started feeling bad yesterday AM.   [WF]    Clinical Course User Index [WF]  Neldon Hamp RAMAN, PA   {Click here for ABCD2, HEART and other calculators REFRESH Note before signing:1}                              Medical Decision Making Amount and/or Complexity of Data Reviewed Labs: ordered. Radiology: ordered.  Risk Prescription drug management.   ***  {Document critical care time when appropriate  Document review of labs and clinical decision tools ie CHADS2VASC2, etc  Document your independent review of radiology images and any outside records  Document your discussion with family members, caretakers and with consultants  Document social determinants of health affecting pt's care  Document your decision making why or why not admission, treatments were needed:32947:::1}   Final diagnoses:  None    ED Discharge Orders     None

## 2024-07-30 NOTE — ED Triage Notes (Signed)
 Here by POV from home for sob. Onset yesterday. Worse waking this am. Former smoker. H/o COPD and Asthma. Denies fever, NV, or pain. Used inhaler, nebs and triology PTA. Increased wob remains. Pt alert, NAD, calm, interactive.

## 2024-07-30 NOTE — ED Notes (Signed)
 Pulse Ox 94% RA after ambulating >20 feet. JRPRN

## 2024-08-02 DIAGNOSIS — J449 Chronic obstructive pulmonary disease, unspecified: Secondary | ICD-10-CM | POA: Diagnosis not present

## 2024-08-02 DIAGNOSIS — I25118 Atherosclerotic heart disease of native coronary artery with other forms of angina pectoris: Secondary | ICD-10-CM | POA: Diagnosis not present

## 2024-08-02 DIAGNOSIS — I10 Essential (primary) hypertension: Secondary | ICD-10-CM | POA: Diagnosis not present

## 2024-08-02 DIAGNOSIS — J441 Chronic obstructive pulmonary disease with (acute) exacerbation: Secondary | ICD-10-CM | POA: Diagnosis not present

## 2024-08-02 DIAGNOSIS — R002 Palpitations: Secondary | ICD-10-CM | POA: Diagnosis not present

## 2024-08-02 DIAGNOSIS — F331 Major depressive disorder, recurrent, moderate: Secondary | ICD-10-CM | POA: Diagnosis not present

## 2024-08-02 DIAGNOSIS — R7303 Prediabetes: Secondary | ICD-10-CM | POA: Diagnosis not present

## 2024-08-02 DIAGNOSIS — G4733 Obstructive sleep apnea (adult) (pediatric): Secondary | ICD-10-CM | POA: Diagnosis not present

## 2024-08-16 DIAGNOSIS — C61 Malignant neoplasm of prostate: Secondary | ICD-10-CM | POA: Diagnosis not present

## 2024-09-10 ENCOUNTER — Other Ambulatory Visit: Payer: Self-pay

## 2024-09-10 ENCOUNTER — Emergency Department (HOSPITAL_COMMUNITY)
Admission: EM | Admit: 2024-09-10 | Discharge: 2024-09-11 | Disposition: A | Discharge status: Home / Self Care | Attending: Emergency Medicine | Admitting: Emergency Medicine

## 2024-09-10 DIAGNOSIS — K625 Hemorrhage of anus and rectum: Secondary | ICD-10-CM | POA: Diagnosis present

## 2024-09-10 DIAGNOSIS — Z8546 Personal history of malignant neoplasm of prostate: Secondary | ICD-10-CM | POA: Insufficient documentation

## 2024-09-10 DIAGNOSIS — Z79899 Other long term (current) drug therapy: Secondary | ICD-10-CM | POA: Diagnosis not present

## 2024-09-10 DIAGNOSIS — F17200 Nicotine dependence, unspecified, uncomplicated: Secondary | ICD-10-CM | POA: Diagnosis not present

## 2024-09-10 DIAGNOSIS — I10 Essential (primary) hypertension: Secondary | ICD-10-CM | POA: Diagnosis not present

## 2024-09-10 DIAGNOSIS — Z7982 Long term (current) use of aspirin: Secondary | ICD-10-CM | POA: Insufficient documentation

## 2024-09-10 DIAGNOSIS — J4489 Other specified chronic obstructive pulmonary disease: Secondary | ICD-10-CM | POA: Insufficient documentation

## 2024-09-10 DIAGNOSIS — Z7951 Long term (current) use of inhaled steroids: Secondary | ICD-10-CM | POA: Diagnosis not present

## 2024-09-10 DIAGNOSIS — K644 Residual hemorrhoidal skin tags: Secondary | ICD-10-CM | POA: Insufficient documentation

## 2024-09-10 LAB — COMPREHENSIVE METABOLIC PANEL WITH GFR
ALT: 13 U/L (ref 0–44)
AST: 19 U/L (ref 15–41)
Albumin: 4.2 g/dL (ref 3.5–5.0)
Alkaline Phosphatase: 95 U/L (ref 38–126)
Anion gap: 10 (ref 5–15)
BUN: 13 mg/dL (ref 8–23)
CO2: 24 mmol/L (ref 22–32)
Calcium: 9.6 mg/dL (ref 8.9–10.3)
Chloride: 106 mmol/L (ref 98–111)
Creatinine, Ser: 1.13 mg/dL (ref 0.61–1.24)
GFR, Estimated: >60 mL/min
Glucose, Bld: 128 mg/dL — ABNORMAL HIGH (ref 70–99)
Potassium: 4.3 mmol/L (ref 3.5–5.1)
Sodium: 140 mmol/L (ref 135–145)
Total Bilirubin: 0.5 mg/dL (ref 0.0–1.2)
Total Protein: 7.3 g/dL (ref 6.5–8.1)

## 2024-09-10 LAB — CBC
HCT: 42.7 % (ref 39.0–52.0)
Hemoglobin: 15.7 g/dL (ref 13.0–17.0)
MCH: 32 pg (ref 26.0–34.0)
MCHC: 36.8 g/dL — ABNORMAL HIGH (ref 30.0–36.0)
MCV: 87 fL (ref 80.0–100.0)
Platelets: 244 K/uL (ref 150–400)
RBC: 4.91 MIL/uL (ref 4.22–5.81)
RDW: 13.9 % (ref 11.5–15.5)
WBC: 6.7 K/uL (ref 4.0–10.5)
nRBC: 0 % (ref 0.0–0.2)

## 2024-09-10 NOTE — ED Triage Notes (Signed)
 Patient c/o lower abdominal pain and rectal bleeding x 1 day. Ptient reports bright red blood in stool x 2 today. Patient denies N/V. Patient denies chest pain and SOB.

## 2024-09-11 ENCOUNTER — Encounter (HOSPITAL_COMMUNITY): Payer: Self-pay

## 2024-09-11 ENCOUNTER — Encounter: Payer: Self-pay | Admitting: *Deleted

## 2024-09-11 NOTE — ED Notes (Signed)
 PT axox4. GCS 15. PT and spouse of patient verbalize understanding of discharge instructions and follow up. Pt ambulated out of er with steady gait to transportation home with spouse

## 2024-09-11 NOTE — ED Provider Notes (Signed)
 " [place] EMERGENCY DEPARTMENT AT [facility] Provider Note  CSN: [id number] Arrival date & time: 09/10/24 2126  Chief Complaint(s) Rectal Bleeding and Abdominal Pain  History provided by patient. HPI & MDM Joshua Stephenson is a 68 y.o. male here for:   Rectal Bleeding Quality:  Bright red Amount:  Moderate Duration:  1 week Timing:  Intermittent Chronicity:  New Context: defecation   Context: not constipation, not diarrhea, not rectal injury and not rectal pain   Similar prior episodes: no   Worsened by:  Defecation Ineffective treatments:  None tried Associated symptoms: abdominal pain (cramping minutes before BM; resolves after BM)   Associated symptoms: no dizziness, no fever, no hematemesis, no light-headedness, no loss of consciousness, no recent illness and no vomiting   Risk factors: no anticoagulant use and no hx of colorectal cancer   Abdominal Pain Associated symptoms: hematochezia   Associated symptoms: no fever, no hematemesis and no vomiting       Medical Decision Making Amount and/or Complexity of Data Reviewed Labs: ordered. Decision-making details documented in ED Course.    Patient presents with hematochezia of bright red blood per rectum. Colonoscopy in 2022 revealed diverticulosis. On exam, patient has external bleeding hemorrhoids.  No fissures. Abdomen is benign. CBC reassuring without leukocytosis or anemia CMP without significant electrolyte derangements or renal sufficiency.  BUN normal.  Suspect lower GI bleed likely from bleeding hemorrhoids though bleeding diverticulosis is also possible. Since hemoglobin is stable and patient is well-appearing, and not on any antibiotics, I feel he is stable for continued outpatient management with a GI follow-up.  No need for emergent imaging at this time as I have low suspicion for serious intra-abdominal inflammatory/infectious process.   Final Clinical Impression(s) / ED  Diagnoses Final diagnoses:  Rectal bleeding   The patient appears reasonably screened and/or stabilized for discharge and I doubt any other medical condition or other [facility] requiring further screening, evaluation, or treatment in the ED at this time. I have discussed the findings, Dx and Tx plan with the patient/family who expressed understanding and agree(s) with the plan. Discharge instructions discussed at length. The patient/family was given strict return precautions who verbalized understanding of the instructions. No further questions at time of discharge.  Disposition: Discharge  Condition: Good  ED Discharge Orders     None       Follow Up: Valentin Skates, DO [address] [place] [address] [phone]  Call  to schedule an appointment for close follow up  Avram Lupita BRAVO, MD [address] [place] [address] [phone]  Call  to schedule an appointment for close follow up     Past Medical History Past Medical History:  Diagnosis Date   Anxiety    Asthma    as a child   Atherosclerosis of aorta    BPH with elevated PSA and lower urinary tract symptoms    COPD, mild (HCC) 06/07/2014   Coronary artery calcification    Depression    Dyspnea    ED (erectile dysfunction)    Elevated PSA    GERD (gastroesophageal reflux disease)    Hemorrhoids    Hx of adenomatous colonic polyps 07/08/2021   2 diminutive - repeat exam 2029   Hypercholesterolemia    Hypertension    Nocturia    Sleep apnea    in process of obtaining cpap   Urinary urgency    Patient Active Problem List   Diagnosis Date Noted   Malignant  neoplasm of prostate (HCC) 03/21/2024   Hematemesis of unknown cause 11/18/2023   Anxiety 03/31/2023   Lung nodule seen on imaging study 09/01/2021   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 Authorizing Provider  albuterol  (PROVENTIL ) (2.5 MG/3ML) 0.083% nebulizer solution Take 3 mLs (2.5 mg total) by nebulization every 6 (six) hours as needed for wheezing or shortness of breath. 06/30/24   Jude Harden GAILS, MD  albuterol  (VENTOLIN  HFA) 108 (90 Base) MCG/ACT inhaler Inhale 1 -2 puffs every 4-6 hours as needed- rescue. 04/13/24   Horton, Roxie HERO, DO  aspirin  EC 81 MG tablet Take 1 tablet (81 mg total) by mouth daily. Swallow whole. 06/10/22   Tolia, Sunit, DO  atorvastatin  (LIPITOR) 20 MG tablet TAKE 1 TABLET BY MOUTH EVERYDAY AT BEDTIME 07/26/23   Tolia, Sunit, DO  clonazePAM  (KLONOPIN ) 0.5 MG tablet Take 1 tablet (0.5 mg total) by mouth at bedtime. 03/31/23   Hope Almarie ORN, NP  doxycycline  (VIBRAMYCIN ) 100 MG capsule Take 1 capsule (100 mg total) by mouth 2 (two) times daily. 06/28/24   Horton, Charmaine FALCON, MD  escitalopram  (LEXAPRO ) 10 MG tablet Take 10 mg by mouth daily. 01/19/23   [provider]  fluconazole  (DIFLUCAN ) 200 MG tablet Take 1 tablet (200 mg total) by mouth daily. Patient not taking: Reported on 06/30/2024 04/06/24   Patrcia Cough, MD  Fluticasone-Umeclidin-Vilant (TRELEGY ELLIPTA ) 200-62.5-25 MCG/ACT AEPB Inhale 1 puff then rinse mouth, once daily 07/24/24   Alva, Rakesh V, MD  gabapentin  (NEURONTIN ) 300 MG capsule Take 300 mg by mouth 2 (two) times daily. Patient not taking: Reported on 06/30/2024    [provider]  guaiFENesin  (MUCINEX ) 600 MG 12 hr tablet Take 1 tablet (600 mg total) by mouth 2 (two) times daily. 04/13/24   Horton, Roxie HERO, DO  methocarbamol  (ROBAXIN ) 750 MG tablet Take  750 mg by mouth 4 (four) times daily. Patient not taking: Reported on 06/30/2024 12/09/22   [provider]  metoprolol  succinate (TOPROL  XL) 50 MG 24 hr tablet Take 1 tablet (50 mg total) by mouth daily. Take with or immediately following a meal. 07/26/23   Tolia, Sunit, DO  nabumetone (RELAFEN) 750 MG tablet Take 750 mg by mouth 2 (two) times daily. Patient not taking: Reported on 06/30/2024 03/29/23   [provider]  pantoprazole  (PROTONIX ) 40 MG tablet Take 40 mg by mouth 2 (two) times daily. Patient not taking: Reported on 06/30/2024    [provider]  predniSONE  (STERAPRED UNI-PAK 21 TAB) 10 MG (21) TBPK tablet Take by mouth daily. Take 6 tabs by mouth daily  for 2 days, then 5 tabs for 2 days, then 4 tabs for 2 days, then 3 tabs for 2 days, 2 tabs for 2 days, then 1 tab by mouth daily for 2 days 07/30/24   Neldon Inoue S, PA  promethazine -dextromethorphan (PROMETHAZINE -DM) 6.25-15 MG/5ML syrup Take 5 mLs by mouth 4 (four) times daily as needed for cough. 07/30/24   Neldon Inoue RAMAN, PA  sildenafil (VIAGRA) 100  MG tablet Take 100 mg by mouth as needed for erectile dysfunction. Patient not taking: Reported on 06/30/2024 09/10/23   [provider]  tamsulosin  (FLOMAX ) 0.4 MG CAPS capsule Take 1 capsule (0.4 mg total) by mouth daily after supper. 05/01/24   Patrcia Cough, MD  dicyclomine  (BENTYL ) 20 MG tablet Take 1 tablet (20 mg total) by mouth 4 (four) times daily -  before meals and at bedtime. As needed for cramps and diarrhea 04/04/18 02/25/20  Avram Lupita BRAVO, MD  triazolam  (HALCION ) 0.25 MG tablet Take 1 tab by mouth 1 hour prior to procedure with very light food, do not drive motor vehicle. 06/23/18 02/25/20  Eldonna Novel, MD                                                                                                                                    Allergies Patient has no known allergies.  Review of Systems Review of Systems  Constitutional:   Negative for fever.  Gastrointestinal:  Positive for abdominal pain (cramping minutes before BM; resolves after BM) and hematochezia. Negative for hematemesis and vomiting.  Neurological:  Negative for dizziness, loss of consciousness and light-headedness.   As noted in HPI  Physical Exam Vital Signs  I have reviewed the triage vital signs BP (!) 144/93 (BP Location: Left Arm)   Pulse 74   Temp 98.2 F (36.8 C) (Oral)   Resp 14   Ht 5' 7 (1.702 m)   Wt 76.2 kg   SpO2 97%   BMI 26.31 kg/m   Physical Exam Vitals reviewed.  Constitutional:      General: He is not in acute distress.    Appearance: He is well-developed. He is not diaphoretic.  HENT:     Head: Normocephalic and atraumatic.     Right Ear: External ear normal.     Left Ear: External ear normal.     Nose: Nose normal.     Mouth/Throat:     Mouth: Mucous membranes are moist.  Eyes:     General: No scleral icterus.    Conjunctiva/sclera: Conjunctivae normal.  Neck:     Trachea: Phonation normal.  Cardiovascular:     Rate and Rhythm: Normal rate and regular rhythm.  Pulmonary:     Effort: Pulmonary effort is normal. No respiratory distress.     Breath sounds: No stridor.  Abdominal:     General: There is no distension.     Tenderness: There is no abdominal tenderness.  Genitourinary:    Rectum: External hemorrhoid (bleeding) present. No anal fissure.  Musculoskeletal:        General: Normal range of motion.     Cervical back: Normal range of motion.  Neurological:     Mental Status: He is alert and oriented to person, place, and time.  Psychiatric:        Behavior: Behavior normal.     ED Results and Treatments  Labs (all labs ordered are listed, but only abnormal results are displayed) Labs Reviewed  COMPREHENSIVE METABOLIC PANEL WITH GFR - Abnormal; Notable for the following components:      Result Value   Glucose, Bld 128 (*)    All other components within normal limits  CBC - Abnormal; Notable  for the following components:   MCHC 36.8 (*)    All other components within normal limits                                                                                                                         EKG  EKG Interpretation Date/Time:    Ventricular Rate:    PR Interval:    QRS Duration:    QT Interval:    QTC Calculation:   R Axis:      Text Interpretation:         Radiology No results found.  Medications Ordered in ED Medications - No data to display Procedures Procedures  (including critical care time)   This chart was dictated using voice recognition software.  Despite best efforts to proofread,  errors can occur which can change the documentation meaning.   Trine Raynell Moder, MD 09/11/24 0301  "

## 2024-09-11 NOTE — Discharge Instructions (Signed)
Increase hydration and follow a high fiber diet. Use a stool softener such as Colace, DulcoLax, or MiraLax to help soften stool and relieve pain with bowel movements. Sitz baths (ask pharmacist if you need help finding this) and Epsom salt baths will also help improve symptoms. Apply topical medications to hemorrhoid twice daily. If symptoms persist longer than 1-2 weeks, please follow up with your primary care provider. Please return to ER for new or worsening symptoms, any additional concerns.  ° °

## 2024-09-25 ENCOUNTER — Ambulatory Visit (HOSPITAL_BASED_OUTPATIENT_CLINIC_OR_DEPARTMENT_OTHER): Payer: Self-pay | Admitting: Pulmonary Disease

## 2024-09-25 NOTE — Telephone Encounter (Signed)
 FYI Only or Action Required?: Action required by provider: request for appointment.  Patient was last seen in primary care on seen outside PCP [facility]  .  Called Nurse Triage reporting Shortness of Breath and Cough.  Symptoms began 1.5 weeks ago .  Interventions attempted: Other: all pulmonary medication inhalers and nebulizer .  Symptoms are: gradually worsening.  Triage Disposition: Go to ED Now (Notify PCP)  Patient/caregiver understands and will follow disposition?: No, wishes to speak with PCP FYI Only or Action Required?: Action required by provider: decline ER requesting appointment has one 10/19/2024 requesting sooner appointment .will call CALL Patient is followed in Pulmonology for COPD, last seen on 06/30/2024 by Jude Harden GAILS, MD.  Called Nurse Triage reporting Shortness of Breath and Cough.  Symptoms began 1.5 weeks ago.  Interventions attempted: Prescription medications: trelegy and albuterol  , Rescue inhaler, and Nebulizer treatments.  Symptoms are: gradually worsening.  Triage Disposition: Go to ED Now (Notify PCP)  Patient/caregiver understands and will follow disposition?: No, wishes to speak with PCP                 Reason for Disposition  Wheezing can be heard across the room  Answer Assessment - Initial Assessment Questions Patient reports like it was last time 1.5 weeks, shortness of breath, steady cough, when coughs can't stop , when coughing fits gasping for air. Racing heart.  At night early morning wakes up gasping for air. Sometimes shortness of breath when walking to bathroom. No oxygen therpy. No oxygen monitor. Wheezing so loud can be heard across the room. History COPD followed by Dr Jude. Cough mixture of phelgm clear and dry. Shortness of breath is sometimes just sitting at rest.  Seeing primary care [facility] , who advised to get seen by pulmonary. This RN advised ER patient is requesting sooner  appointment with pulmonary   Denies the following dizziness, syncope, chest pain ,lips blue, fever, chills    1. RESPIRATORY STATUS: Describe your breathing? (e.g., wheezing, shortness of breath, unable to speak, severe coughing)      Shortness of breath, loud audible wheezing, constant cough deep breaht induce coughing  2. ONSET: When did this breathing problem begin?      1.5 weeks ago  3. PATTERN Does the difficult breathing come and go, or has it been constant since it started?      Constant with cough and coming and goes at rest , needs inhaler 2 puffs then in same hour nebulizer and then inhaler albuterol  again repeating this every hour  4. SEVERITY: How bad is your breathing? (e.g., mild, moderate, severe)      Severe with coughing fits , and at night gasping for air  5. RECURRENT SYMPTOM: Have you had difficulty breathing before? If Yes, ask: When was the last time? and What happened that time?      Yes has had this before  6. CARDIAC HISTORY: Do you have any history of heart disease? (e.g., heart attack, angina, bypass surgery, angioplasty)      Not reported  7. LUNG HISTORY: Do you have any history of lung disease?  (e.g., pulmonary embolus, asthma, emphysema)     COPD  8. CAUSE: What do you think is causing the breathing problem?      COPD history  per chart   9. OTHER SYMPTOMS: Do you have any other symptoms? (e.g., chest pain, cough, dizziness, fever, runny nose)     Cough constant   Patient denies  the following chest pain , syncope, lips blue  10. O2 SATURATION MONITOR:  Do you use an oxygen saturation monitor (pulse oximeter) at home? If Yes, ask: What is your reading (oxygen level) today? What is your usual oxygen saturation reading? (e.g., 95%)       Not able to assess  Protocols used: Breathing Difficulty-A-AH  Copied from CRM #[id number]. Topic: Clinical - Red Word Triage >> Sep 25, 2024  3:05 PM Rilla B wrote: Red Word that prompted  transfer to Nurse Triage:  Shortness of breath, cough that makes it had to breath

## 2024-09-25 NOTE — Telephone Encounter (Signed)
 Appt made for pt for tomorrow with Candis Dandy

## 2024-09-25 NOTE — Telephone Encounter (Signed)
 Called [facility] to report decline  in ER and request for appointment

## 2024-09-26 ENCOUNTER — Ambulatory Visit (INDEPENDENT_AMBULATORY_CARE_PROVIDER_SITE_OTHER)

## 2024-09-26 ENCOUNTER — Encounter (HOSPITAL_BASED_OUTPATIENT_CLINIC_OR_DEPARTMENT_OTHER): Payer: Self-pay

## 2024-09-26 VITALS — BP 135/67 | HR 64 | Ht 67.0 in | Wt 173.0 lb

## 2024-09-26 DIAGNOSIS — J441 Chronic obstructive pulmonary disease with (acute) exacerbation: Secondary | ICD-10-CM | POA: Diagnosis not present

## 2024-09-26 DIAGNOSIS — Z87891 Personal history of nicotine dependence: Secondary | ICD-10-CM | POA: Diagnosis not present

## 2024-09-26 MED ORDER — PREDNISONE 10 MG PO TABS: ORAL_TABLET | ORAL | 0 refills | Status: DC

## 2024-09-26 MED ORDER — DM-GUAIFENESIN ER 30-600 MG PO TB12: 1.0000 | ORAL_TABLET | Freq: Two times a day (BID) | ORAL | 1 refills | Status: AC | PRN

## 2024-09-26 MED ORDER — TRELEGY ELLIPTA 200-62.5-25 MCG/ACT IN AEPB: INHALATION_SPRAY | RESPIRATORY_TRACT | 4 refills | Status: AC

## 2024-09-26 MED ORDER — LEVOFLOXACIN 500 MG PO TABS: 500.0000 mg | ORAL_TABLET | Freq: Every day | ORAL | 0 refills | Status: AC

## 2024-09-26 NOTE — Patient Instructions (Addendum)
 Start antibiotic and steroid; finish all medication.  Keep follow up as scheduled with Dr. Jude.  Continue Trelegy daily.  Continue albuterol  every 4 hours as needed.  Mucinex  twice daily as needed for cough.   Dupixent considered for biologic therapy.

## 2024-09-26 NOTE — Progress Notes (Signed)
 "  @Patient  ID: Joshua Stephenson, male    DOB: 03-25-1956, 69 y.o.   MRN: [id number]  Chief Complaint  Patient presents with   Acute Visit    Referring provider: Valentin Skates, DO  HPI: Discussed the use of AI scribe software for clinical note transcription with the patient, who gave verbal consent to proceed.  History of Present Illness Joshua Stephenson is a 69 year old male with COPD who presents with worsening shortness of breath and cough. He is accompanied by his wife, [name].  He has been experiencing significant shortness of breath and a persistent cough for the past one and a half weeks. The sensation is described as 'somebody shut my windpipe off', with difficulty breathing through his mouth. Despite using inhalers, including Trelegy and albuterol , as well as nebulizers, there has been no relief.  On December 27th, he was prescribed prednisone  and doxycycline , which initially improved his symptoms. However, after completing the prednisone  course last week, his symptoms returned. While on the medication, his symptoms were 'basically under control'.  Nighttime symptoms include waking up coughing, rapid heartbeats, sweating, and gasping for air, leading to panic. He has been using albuterol  more frequently than prescribed, sometimes four times within an hour, but it has not been effective. He also uses a nebulizer twice a day and Trelegy once a day.  He has a history of being treated for pneumonia and mentions that his symptoms feel similar to a chest cold or lung infection. A chest x-ray was done on December 27th at [facility], but results are pending.  He uses Mucinex , purchased over the counter, but is currently out of it. The mucus he coughs up is clear. No recent illness in those around him and no known allergies to antibiotics.  Last OV 06/30/2024: Joshua Stephenson is a 69 year old male with COPD who presents with worsening respiratory symptoms. He is  accompanied by his wife. He normally sees Dr. Neysa.   He has experienced a persistent cough and shortness of breath for about a month. On October 7th, he had severe dyspnea and tachycardia after climbing stairs, requiring emergency medical care. He received nebulizer treatments, prednisone , and an antibiotic.-doxy   Despite completing the medication course, he continues to have a strong cough and occasional mucus production. Symptoms worsen with physical activity and weather changes, and he sometimes wakes at night with a deep cough and sweating.   He uses Trelegy twice daily instead of once and uses albuterol  as needed, though it is sometimes ineffective without the nebulizer.   He quit smoking in 2002 after starting in his mid-teens and was a heavy drinker in the past. He recalls prior pulmonary function tests.   No runny nose or sore throat prior to the onset of current symptoms, except when unable to breathe and coughing causes nasal discharge.    ED record reviewed  CXR s/o RLL early infx       CTA 03/2024 >> stable RUL nodule   Significant tests/ events reviewed   HST 01/26/21- AHI 9.3/ hr, desaturation to 86%, body weight 187 lbs CPAP auto 5-15/ [employer]   ordered 06/27/21 PFT 04/30/21-minimal obstruction, insignif resp to BD  FEV1/FVC 0.75 PFT 01/03/24- FEV1 53% ,moderately severe COPD w/o resp to BD. Nl lung volumes and DLCO.  Allergies[1]  Immunization History  Administered Date(s) Administered   Fluad Quad(high Dose 65+) 06/22/2023   Influenza,inj,Quad PF,6+ Mos 06/07/2014   PNEUMOCOCCAL CONJUGATE-20 01/06/2022   Pneumococcal Polysaccharide-23 12/18/2020,  01/06/2022   Td 01/25/2018   Td (Adult) 01/25/2018   Tdap 02/14/2018    Past Medical History:  Diagnosis Date   Anxiety    Asthma    as a child   Atherosclerosis of aorta    BPH with elevated PSA and lower urinary tract symptoms    COPD, mild (HCC) 06/07/2014   Coronary artery calcification    Depression     Dyspnea    ED (erectile dysfunction)    Elevated PSA    GERD (gastroesophageal reflux disease)    Hemorrhoids    Hx of adenomatous colonic polyps 07/08/2021   2 diminutive - repeat exam 2029   Hypercholesterolemia    Hypertension    Nocturia    Sleep apnea    in process of obtaining cpap   Urinary urgency     Tobacco History: Tobacco Use History[2] Counseling given: Not Answered   Outpatient Medications Prior to Visit  Medication Sig Dispense Refill   albuterol  (PROVENTIL ) (2.5 MG/3ML) 0.083% nebulizer solution Take 3 mLs (2.5 mg total) by nebulization every 6 (six) hours as needed for wheezing or shortness of breath. 75 mL 3   albuterol  (VENTOLIN  HFA) 108 (90 Base) MCG/ACT inhaler Inhale 1 -2 puffs every 4-6 hours as needed- rescue. 54 g 12   aspirin  EC 81 MG tablet Take 1 tablet (81 mg total) by mouth daily. Swallow whole. 30 tablet 12   atorvastatin  (LIPITOR) 20 MG tablet TAKE 1 TABLET BY MOUTH EVERYDAY AT BEDTIME 90 tablet 3   clonazePAM  (KLONOPIN ) 0.5 MG tablet Take 1 tablet (0.5 mg total) by mouth at bedtime. 30 tablet 0   doxycycline  (VIBRAMYCIN ) 100 MG capsule Take 1 capsule (100 mg total) by mouth 2 (two) times daily. 20 capsule 0   escitalopram  (LEXAPRO ) 10 MG tablet Take 10 mg by mouth daily.     fluconazole  (DIFLUCAN ) 200 MG tablet Take 1 tablet (200 mg total) by mouth daily. 2 tablet 0   gabapentin  (NEURONTIN ) 300 MG capsule Take 300 mg by mouth 2 (two) times daily.     guaiFENesin  (MUCINEX ) 600 MG 12 hr tablet Take 1 tablet (600 mg total) by mouth 2 (two) times daily. 14 tablet 0   methocarbamol  (ROBAXIN ) 750 MG tablet Take 750 mg by mouth 4 (four) times daily.     metoprolol  succinate (TOPROL  XL) 50 MG 24 hr tablet Take 1 tablet (50 mg total) by mouth daily. Take with or immediately following a meal. 90 tablet 3   nabumetone (RELAFEN) 750 MG tablet Take 750 mg by mouth 2 (two) times daily.     pantoprazole  (PROTONIX ) 40 MG tablet Take 40 mg by mouth 2 (two) times  daily.     predniSONE  (STERAPRED UNI-PAK 21 TAB) 10 MG (21) TBPK tablet Take by mouth daily. Take 6 tabs by mouth daily  for 2 days, then 5 tabs for 2 days, then 4 tabs for 2 days, then 3 tabs for 2 days, 2 tabs for 2 days, then 1 tab by mouth daily for 2 days 42 tablet 0   promethazine -dextromethorphan (PROMETHAZINE -DM) 6.25-15 MG/5ML syrup Take 5 mLs by mouth 4 (four) times daily as needed for cough. 118 mL 0   sildenafil (VIAGRA) 100 MG tablet Take 100 mg by mouth as needed for erectile dysfunction.     tamsulosin  (FLOMAX ) 0.4 MG CAPS capsule Take 1 capsule (0.4 mg total) by mouth daily after supper. 30 capsule 5   Fluticasone-Umeclidin-Vilant (TRELEGY ELLIPTA ) 200-62.5-25 MCG/ACT AEPB Inhale 1 puff then  rinse mouth, once daily 180 each 4   No facility-administered medications prior to visit.     Review of Systems: as per hpi  Constitutional:   No  weight loss, night sweats,  Fevers, chills, fatigue, or  lassitude.  HEENT:   No headaches,  Difficulty swallowing,  Tooth/dental problems, or  Sore throat,                No sneezing, itching, ear ache, nasal congestion, post nasal drip,   CV:  No chest pain,  Orthopnea, PND, swelling in lower extremities, anasarca, dizziness, palpitations, syncope.   GI  No heartburn, indigestion, abdominal pain, nausea, vomiting, diarrhea, change in bowel habits, loss of appetite, bloody stools.   Resp: No shortness of breath with exertion or at rest.  No excess mucus, no productive cough,  No non-productive cough,  No coughing up of blood.  No change in color of mucus.  No wheezing.  No chest wall deformity  Skin: no rash or lesions.  GU: no dysuria, change in color of urine, no urgency or frequency.  No flank pain, no hematuria   MS:  No joint pain or swelling.  No decreased range of motion.  No back pain.    Physical Exam  BP 135/67   Pulse 64   Ht 5' 7 (1.702 m)   Wt 173 lb (78.5 kg)   SpO2 94%   BMI 27.10 kg/m   GEN: A/Ox3; pleasant ,  NAD, well nourished    HEENT:  [place]/AT,  EACs-clear, TMs-wnl, NOSE-clear, THROAT-clear, no lesions, no postnasal drip or exudate noted.   NECK:  Supple w/ fair ROM; no JVD; normal carotid impulses w/o bruits; no thyromegaly or nodules palpated; no lymphadenopathy.    RESP  Few scattered wheezes throughout  P & A; w/o, rales/ or rhonchi. no accessory muscle use, no dullness to percussion  CARD:  RRR, no m/r/g, no peripheral edema, pulses intact, no cyanosis or clubbing.  GI:   Soft & nt; nml bowel sounds; no organomegaly or masses detected.   Musco: Warm bil, no deformities or joint swelling noted.   Neuro: alert, no focal deficits noted.    Skin: Warm, no lesions or rashes    Lab Results:  CBC    Component Value Date/Time   WBC 6.7 09/10/2024 2229   RBC 4.91 09/10/2024 2229   HGB 15.7 09/10/2024 2229   HGB 17.4 06/24/2022 1135   HCT 42.7 09/10/2024 2229   HCT 50.3 06/24/2022 1135   PLT 244 09/10/2024 2229   MCV 87.0 09/10/2024 2229   MCH 32.0 09/10/2024 2229   MCHC 36.8 (H) 09/10/2024 2229   RDW 13.9 09/10/2024 2229   LYMPHSABS 1.8 07/30/2024 0819   MONOABS 1.0 07/30/2024 0819   EOSABS 0.4 07/30/2024 0819   BASOSABS 0.0 07/30/2024 0819    BMET    Component Value Date/Time   NA 140 09/10/2024 2229   NA 139 08/30/2023 0945   K 4.3 09/10/2024 2229   CL 106 09/10/2024 2229   CO2 24 09/10/2024 2229   GLUCOSE 128 (H) 09/10/2024 2229   BUN 13 09/10/2024 2229   BUN 8 08/30/2023 0945   CREATININE 1.13 09/10/2024 2229   CALCIUM  9.6 09/10/2024 2229   GFRNONAA >60 09/10/2024 2229   GFRAA 71 11/05/2020 1147    BNP No results found for: BNP  ProBNP    Component Value Date/Time   PROBNP 124.0 06/27/2024 2233   PROBNP 22.2 05/27/2014 1201    Imaging: No  results found.  Administration History     None          Latest Ref Rng & Units 01/03/2024    1:27 PM 04/30/2021    9:37 AM  PFT Results  FVC-Pre L 3.06  3.38   FVC-Predicted Pre % 74  95   FVC-Post  L 3.25  3.17   FVC-Predicted Post % 78  89   Pre FEV1/FVC % % 54  75   Post FEV1/FCV % % 55  76   FEV1-Pre L 1.65  2.54   FEV1-Predicted Pre % 53  93   FEV1-Post L 1.80  2.42   DLCO uncorrected ml/min/mmHg 19.93    DLCO UNC% % 81    DLVA Predicted % 102    TLC L 5.24    TLC % Predicted % 81    RV % Predicted % 93      No results found for: NITRICOXIDE   Assessment & Plan:   Assessment & Plan COPD with acute exacerbation (HCC)  Assessment and Plan Assessment & Plan COPD with acute exacerbation Acute exacerbation with persistent cough, dyspnea, and wheezing. Previous prednisone  and doxycycline  provided temporary relief. No improvement with current inhaler regimen. Current doxycycline  treatment may be ineffective due to resistance or inadequate coverage. - Start Levaquin  and Prednisone  taper - Continue current inhaler regimen with albuterol  followed by Trelegy. - Prescribed Mucinex . - Due to history of recurrent COPD exacerbations:  provided information on potential biologic therapy for future consideration. (CBC 04/12/2024 with absolute eos:  590; performed at ED visit prior to administration of steroids) - Advised rest, fluid intake, and gradual activity increase.    Return for as scheduled with Dr. Jude.  Candis Dandy, PA-C 09/26/2024      [1] No Known Allergies [2]  Social History Tobacco Use  Smoking Status Former   Current packs/day: 0.00   Average packs/day: 1 pack/day for 28.0 years (28.0 ttl pk-yrs)   Types: Cigarettes   Start date: 85   Quit date: 2002   Years since quitting: 24.0   Passive exposure: Current  Smokeless Tobacco Never   "

## 2024-10-02 ENCOUNTER — Observation Stay (HOSPITAL_COMMUNITY)
Admission: EM | Admit: 2024-10-02 | Discharge: 2024-10-03 | Disposition: A | Attending: Emergency Medicine | Admitting: Emergency Medicine

## 2024-10-02 ENCOUNTER — Encounter (HOSPITAL_COMMUNITY): Payer: Self-pay

## 2024-10-02 ENCOUNTER — Other Ambulatory Visit: Payer: Self-pay

## 2024-10-02 ENCOUNTER — Emergency Department (HOSPITAL_COMMUNITY)

## 2024-10-02 DIAGNOSIS — J9601 Acute respiratory failure with hypoxia: Secondary | ICD-10-CM | POA: Diagnosis present

## 2024-10-02 DIAGNOSIS — N401 Enlarged prostate with lower urinary tract symptoms: Secondary | ICD-10-CM | POA: Diagnosis not present

## 2024-10-02 DIAGNOSIS — F32A Depression, unspecified: Secondary | ICD-10-CM | POA: Diagnosis not present

## 2024-10-02 DIAGNOSIS — Z79899 Other long term (current) drug therapy: Secondary | ICD-10-CM | POA: Insufficient documentation

## 2024-10-02 DIAGNOSIS — J441 Chronic obstructive pulmonary disease with (acute) exacerbation: Secondary | ICD-10-CM | POA: Diagnosis not present

## 2024-10-02 DIAGNOSIS — R7303 Prediabetes: Secondary | ICD-10-CM | POA: Diagnosis not present

## 2024-10-02 DIAGNOSIS — Z8546 Personal history of malignant neoplasm of prostate: Secondary | ICD-10-CM | POA: Insufficient documentation

## 2024-10-02 DIAGNOSIS — I700 Atherosclerosis of aorta: Secondary | ICD-10-CM | POA: Insufficient documentation

## 2024-10-02 DIAGNOSIS — G4733 Obstructive sleep apnea (adult) (pediatric): Secondary | ICD-10-CM | POA: Diagnosis not present

## 2024-10-02 DIAGNOSIS — K5730 Diverticulosis of large intestine without perforation or abscess without bleeding: Secondary | ICD-10-CM | POA: Insufficient documentation

## 2024-10-02 DIAGNOSIS — F341 Dysthymic disorder: Secondary | ICD-10-CM | POA: Diagnosis present

## 2024-10-02 DIAGNOSIS — D649 Anemia, unspecified: Secondary | ICD-10-CM | POA: Insufficient documentation

## 2024-10-02 DIAGNOSIS — K219 Gastro-esophageal reflux disease without esophagitis: Secondary | ICD-10-CM | POA: Diagnosis not present

## 2024-10-02 DIAGNOSIS — I2510 Atherosclerotic heart disease of native coronary artery without angina pectoris: Secondary | ICD-10-CM | POA: Insufficient documentation

## 2024-10-02 DIAGNOSIS — Z7982 Long term (current) use of aspirin: Secondary | ICD-10-CM | POA: Insufficient documentation

## 2024-10-02 DIAGNOSIS — R0602 Shortness of breath: Secondary | ICD-10-CM | POA: Diagnosis present

## 2024-10-02 DIAGNOSIS — I10 Essential (primary) hypertension: Secondary | ICD-10-CM | POA: Diagnosis not present

## 2024-10-02 DIAGNOSIS — C61 Malignant neoplasm of prostate: Secondary | ICD-10-CM | POA: Diagnosis present

## 2024-10-02 DIAGNOSIS — E785 Hyperlipidemia, unspecified: Secondary | ICD-10-CM | POA: Diagnosis not present

## 2024-10-02 LAB — CBC WITH DIFFERENTIAL/PLATELET
Abs Immature Granulocytes: 0.2 K/uL — ABNORMAL HIGH (ref 0.00–0.07)
Basophils Absolute: 0.1 K/uL (ref 0.0–0.1)
Basophils Relative: 1 %
Eosinophils Absolute: 0.3 K/uL (ref 0.0–0.5)
Eosinophils Relative: 3 %
HCT: 38.4 % — ABNORMAL LOW (ref 39.0–52.0)
Hemoglobin: 14.3 g/dL (ref 13.0–17.0)
Immature Granulocytes: 2 %
Lymphocytes Relative: 20 %
Lymphs Abs: 2.3 K/uL (ref 0.7–4.0)
MCH: 31.9 pg (ref 26.0–34.0)
MCHC: 37.2 g/dL — ABNORMAL HIGH (ref 30.0–36.0)
MCV: 85.7 fL (ref 80.0–100.0)
Monocytes Absolute: 1.5 K/uL — ABNORMAL HIGH (ref 0.1–1.0)
Monocytes Relative: 13 %
Neutro Abs: 7.1 K/uL (ref 1.7–7.7)
Neutrophils Relative %: 61 %
Platelets: 257 K/uL (ref 150–400)
RBC: 4.48 MIL/uL (ref 4.22–5.81)
RDW: 14.2 % (ref 11.5–15.5)
Smear Review: NORMAL
WBC: 11.5 K/uL — ABNORMAL HIGH (ref 4.0–10.5)
nRBC: 0 % (ref 0.0–0.2)

## 2024-10-02 LAB — BASIC METABOLIC PANEL WITH GFR
Anion gap: 13 (ref 5–15)
BUN: 13 mg/dL (ref 8–23)
CO2: 28 mmol/L (ref 22–32)
Calcium: 9.9 mg/dL (ref 8.9–10.3)
Chloride: 98 mmol/L (ref 98–111)
Creatinine, Ser: 1.33 mg/dL — ABNORMAL HIGH (ref 0.61–1.24)
GFR, Estimated: 59 mL/min — ABNORMAL LOW
Glucose, Bld: 123 mg/dL — ABNORMAL HIGH (ref 70–99)
Potassium: 3.4 mmol/L — ABNORMAL LOW (ref 3.5–5.1)
Sodium: 138 mmol/L (ref 135–145)

## 2024-10-02 LAB — TROPONIN T, HIGH SENSITIVITY
Troponin T High Sensitivity: 18 ng/L (ref 0–19)
Troponin T High Sensitivity: 18 ng/L (ref 0–19)

## 2024-10-02 LAB — PHOSPHORUS: Phosphorus: 3.8 mg/dL (ref 2.5–4.6)

## 2024-10-02 LAB — RESP PANEL BY RT-PCR (RSV, FLU A&B, COVID)  RVPGX2
Influenza A by PCR: NEGATIVE
Influenza B by PCR: NEGATIVE
Resp Syncytial Virus by PCR: NEGATIVE
SARS Coronavirus 2 by RT PCR: NEGATIVE

## 2024-10-02 MED ORDER — PREDNISONE 20 MG PO TABS
40.0000 mg | ORAL_TABLET | Freq: Every day | ORAL | Status: DC
  Administered 2024-10-03: 40 mg via ORAL
  Filled 2024-10-02: qty 2

## 2024-10-02 MED ORDER — ENOXAPARIN SODIUM 40 MG/0.4ML IJ SOSY
40.0000 mg | PREFILLED_SYRINGE | Freq: Every day | INTRAMUSCULAR | Status: DC
  Administered 2024-10-02 – 2024-10-03 (×2): 40 mg via SUBCUTANEOUS
  Filled 2024-10-02 (×2): qty 0.4

## 2024-10-02 MED ORDER — METOPROLOL SUCCINATE ER 50 MG PO TB24
50.0000 mg | ORAL_TABLET | Freq: Every day | ORAL | Status: DC
  Administered 2024-10-02 – 2024-10-03 (×2): 50 mg via ORAL
  Filled 2024-10-02 (×2): qty 1

## 2024-10-02 MED ORDER — PANTOPRAZOLE SODIUM 40 MG PO TBEC
40.0000 mg | DELAYED_RELEASE_TABLET | Freq: Two times a day (BID) | ORAL | Status: DC
  Administered 2024-10-02 – 2024-10-03 (×3): 40 mg via ORAL
  Filled 2024-10-02 (×3): qty 1

## 2024-10-02 MED ORDER — METHYLPREDNISOLONE SODIUM SUCC 125 MG IJ SOLR
125.0000 mg | Freq: Once | INTRAMUSCULAR | Status: AC
  Administered 2024-10-02: 125 mg via INTRAVENOUS
  Filled 2024-10-02: qty 2

## 2024-10-02 MED ORDER — ESCITALOPRAM OXALATE 10 MG PO TABS
10.0000 mg | ORAL_TABLET | Freq: Every day | ORAL | Status: DC
  Administered 2024-10-02 – 2024-10-03 (×2): 10 mg via ORAL
  Filled 2024-10-02 (×2): qty 1

## 2024-10-02 MED ORDER — IPRATROPIUM-ALBUTEROL 0.5-2.5 (3) MG/3ML IN SOLN
3.0000 mL | Freq: Once | RESPIRATORY_TRACT | Status: AC
  Administered 2024-10-02: 3 mL via RESPIRATORY_TRACT
  Filled 2024-10-02: qty 3

## 2024-10-02 MED ORDER — ACETAMINOPHEN 650 MG RE SUPP: 650.0000 mg | Freq: Four times a day (QID) | RECTAL | Status: DC | PRN

## 2024-10-02 MED ORDER — ALBUTEROL SULFATE (2.5 MG/3ML) 0.083% IN NEBU
2.5000 mg | INHALATION_SOLUTION | RESPIRATORY_TRACT | Status: DC
  Administered 2024-10-02 (×2): 2.5 mg via RESPIRATORY_TRACT
  Filled 2024-10-02 (×2): qty 3

## 2024-10-02 MED ORDER — HYDROCODONE BIT-HOMATROP MBR 5-1.5 MG/5ML PO SOLN: 5.0000 mL | Freq: Four times a day (QID) | ORAL | Status: DC | PRN

## 2024-10-02 MED ORDER — SODIUM CHLORIDE 0.9 % IV BOLUS
500.0000 mL | Freq: Once | INTRAVENOUS | Status: AC
  Administered 2024-10-02: 500 mL via INTRAVENOUS

## 2024-10-02 MED ORDER — BUDESONIDE 0.25 MG/2ML IN SUSP
0.2500 mg | Freq: Two times a day (BID) | RESPIRATORY_TRACT | Status: DC
  Administered 2024-10-02 – 2024-10-03 (×2): 0.25 mg via RESPIRATORY_TRACT
  Filled 2024-10-02 (×2): qty 2

## 2024-10-02 MED ORDER — ACETAMINOPHEN 325 MG PO TABS
650.0000 mg | ORAL_TABLET | Freq: Once | ORAL | Status: AC
  Administered 2024-10-02: 650 mg via ORAL
  Filled 2024-10-02: qty 2

## 2024-10-02 MED ORDER — ATORVASTATIN CALCIUM 10 MG PO TABS
20.0000 mg | ORAL_TABLET | Freq: Every day | ORAL | Status: DC
  Administered 2024-10-02: 20 mg via ORAL
  Filled 2024-10-02: qty 2

## 2024-10-02 MED ORDER — ONDANSETRON HCL 4 MG PO TABS: 4.0000 mg | ORAL_TABLET | Freq: Four times a day (QID) | ORAL | Status: DC | PRN

## 2024-10-02 MED ORDER — REVEFENACIN 175 MCG/3ML IN SOLN
175.0000 ug | Freq: Every day | RESPIRATORY_TRACT | Status: DC
  Administered 2024-10-03: 175 ug via RESPIRATORY_TRACT
  Filled 2024-10-02: qty 3

## 2024-10-02 MED ORDER — ASPIRIN 81 MG PO TBEC
81.0000 mg | DELAYED_RELEASE_TABLET | Freq: Every day | ORAL | Status: DC
  Administered 2024-10-02 – 2024-10-03 (×2): 81 mg via ORAL
  Filled 2024-10-02 (×2): qty 1

## 2024-10-02 MED ORDER — ALBUTEROL SULFATE (2.5 MG/3ML) 0.083% IN NEBU
2.5000 mg | INHALATION_SOLUTION | Freq: Once | RESPIRATORY_TRACT | Status: AC
  Administered 2024-10-02: 2.5 mg via RESPIRATORY_TRACT
  Filled 2024-10-02: qty 3

## 2024-10-02 MED ORDER — ARFORMOTEROL TARTRATE 15 MCG/2ML IN NEBU
15.0000 ug | INHALATION_SOLUTION | Freq: Two times a day (BID) | RESPIRATORY_TRACT | Status: DC
  Administered 2024-10-02 – 2024-10-03 (×2): 15 ug via RESPIRATORY_TRACT
  Filled 2024-10-02 (×2): qty 2

## 2024-10-02 MED ORDER — POTASSIUM CHLORIDE CRYS ER 20 MEQ PO TBCR
40.0000 meq | EXTENDED_RELEASE_TABLET | Freq: Every day | ORAL | Status: AC
  Administered 2024-10-02: 40 meq via ORAL
  Filled 2024-10-02: qty 2

## 2024-10-02 MED ORDER — POTASSIUM CHLORIDE CRYS ER 20 MEQ PO TBCR
40.0000 meq | EXTENDED_RELEASE_TABLET | Freq: Once | ORAL | Status: AC
  Administered 2024-10-02: 40 meq via ORAL
  Filled 2024-10-02: qty 2

## 2024-10-02 MED ORDER — ALBUTEROL SULFATE (2.5 MG/3ML) 0.083% IN NEBU
10.0000 mg/h | INHALATION_SOLUTION | Freq: Once | RESPIRATORY_TRACT | Status: AC
  Administered 2024-10-02: 10 mg/h via RESPIRATORY_TRACT
  Filled 2024-10-02: qty 12

## 2024-10-02 MED ORDER — MAGNESIUM SULFATE 2 GM/50ML IV SOLN
2.0000 g | Freq: Once | INTRAVENOUS | Status: AC
  Administered 2024-10-02: 2 g via INTRAVENOUS
  Filled 2024-10-02: qty 50

## 2024-10-02 MED ORDER — GUAIFENESIN ER 600 MG PO TB12
600.0000 mg | ORAL_TABLET | Freq: Two times a day (BID) | ORAL | Status: DC
  Administered 2024-10-02 – 2024-10-03 (×3): 600 mg via ORAL
  Filled 2024-10-02 (×3): qty 1

## 2024-10-02 MED ORDER — ONDANSETRON HCL 4 MG/2ML IJ SOLN: 4.0000 mg | Freq: Four times a day (QID) | INTRAMUSCULAR | Status: DC | PRN

## 2024-10-02 MED ORDER — ACETAMINOPHEN 325 MG PO TABS: 650.0000 mg | ORAL_TABLET | Freq: Four times a day (QID) | ORAL | Status: DC | PRN

## 2024-10-02 MED ORDER — TAMSULOSIN HCL 0.4 MG PO CAPS
0.4000 mg | ORAL_CAPSULE | Freq: Every day | ORAL | Status: DC
  Administered 2024-10-02: 0.4 mg via ORAL
  Filled 2024-10-02: qty 1

## 2024-10-02 NOTE — ED Provider Notes (Signed)
 "  EMERGENCY DEPARTMENT AT [facility] Provider Note   CSN: [id number] Arrival date & time: 10/02/24  9392     Patient presents with: Shortness of Breath   Joshua Stephenson is a 69 y.o. male.   HPI 69 year old male presents with shortness of breath.  He has been dealing with a cough and shortness of breath for about a month.  Been in and out of the ER and with his doctor.  He feels like he has gotten acutely worse since yesterday.  His cough has become more productive of clear/white sputum.  He is feeling more short of breath and this morning has noticed some sudden but severe on and off chest pains.  No fevers or leg swelling.  No sore throat.  He has been using albuterol  nebulizer and inhaler and his Trelegy without relief.  He is currently on Levaquin  from his PCP as well as a prednisone  taper.  Prior to Admission medications  Medication Sig Start Date End Date Taking? Authorizing Provider  albuterol  (PROVENTIL ) (2.5 MG/3ML) 0.083% nebulizer solution Take 3 mLs (2.5 mg total) by nebulization every 6 (six) hours as needed for wheezing or shortness of breath. 06/30/24   Jude Harden GAILS, MD  albuterol  (VENTOLIN  HFA) 108 (90 Base) MCG/ACT inhaler Inhale 1 -2 puffs every 4-6 hours as needed- rescue. 04/13/24   Horton, Roxie HERO, DO  aspirin  EC 81 MG tablet Take 1 tablet (81 mg total) by mouth daily. Swallow whole. 06/10/22   Tolia, Sunit, DO  atorvastatin  (LIPITOR) 20 MG tablet TAKE 1 TABLET BY MOUTH EVERYDAY AT BEDTIME 07/26/23   Tolia, Sunit, DO  clonazePAM  (KLONOPIN ) 0.5 MG tablet Take 1 tablet (0.5 mg total) by mouth at bedtime. 03/31/23   Hope Almarie ORN, NP  dextromethorphan-guaiFENesin  (MUCINEX  DM) 30-600 MG 12hr tablet Take 1 tablet by mouth 2 (two) times daily as needed for cough. 09/26/24   Charley Conger, PA-C  doxycycline  (VIBRAMYCIN ) 100 MG capsule Take 1 capsule (100 mg total) by mouth 2 (two) times daily. 06/28/24   Horton, Charmaine FALCON, MD  escitalopram   (LEXAPRO ) 10 MG tablet Take 10 mg by mouth daily. 01/19/23   [provider]  fluconazole  (DIFLUCAN ) 200 MG tablet Take 1 tablet (200 mg total) by mouth daily. 04/06/24   Patrcia Cough, MD  Fluticasone-Umeclidin-Vilant (TRELEGY ELLIPTA ) 200-62.5-25 MCG/ACT AEPB Inhale 1 puff then rinse mouth, once daily 09/26/24   Gillis, Joan, PA-C  gabapentin  (NEURONTIN ) 300 MG capsule Take 300 mg by mouth 2 (two) times daily.    [provider]  guaiFENesin  (MUCINEX ) 600 MG 12 hr tablet Take 1 tablet (600 mg total) by mouth 2 (two) times daily. 04/13/24   Horton, Roxie HERO, DO  levofloxacin  (LEVAQUIN ) 500 MG tablet Take 1 tablet (500 mg total) by mouth daily. 09/26/24   Charley Conger, PA-C  methocarbamol  (ROBAXIN ) 750 MG tablet Take 750 mg by mouth 4 (four) times daily. 12/09/22   [provider]  metoprolol  succinate (TOPROL  XL) 50 MG 24 hr tablet Take 1 tablet (50 mg total) by mouth daily. Take with or immediately following a meal. 07/26/23   Tolia, Sunit, DO  nabumetone (RELAFEN) 750 MG tablet Take 750 mg by mouth 2 (two) times daily. 03/29/23   [provider]  pantoprazole  (PROTONIX ) 40 MG tablet Take 40 mg by mouth 2 (two) times daily.    [provider]  predniSONE  (DELTASONE ) 10 MG tablet Take 4 tablets (40 mg total) by mouth daily for 2 days, THEN  3 tablets (30 mg total) daily for 2 days, THEN 2 tablets (20 mg total) daily for 2 days, THEN 1 tablet (10 mg total) daily for 2 days. 09/26/24 10/04/24  Charley Conger, PA-C  predniSONE  (STERAPRED UNI-PAK 21 TAB) 10 MG (21) TBPK tablet Take by mouth daily. Take 6 tabs by mouth daily  for 2 days, then 5 tabs for 2 days, then 4 tabs for 2 days, then 3 tabs for 2 days, 2 tabs for 2 days, then 1 tab by mouth daily for 2 days 07/30/24   Neldon Hamp RAMAN, PA  promethazine -dextromethorphan (PROMETHAZINE -DM) 6.25-15 MG/5ML syrup Take 5 mLs by mouth 4 (four) times daily as needed for cough. 07/30/24   Neldon Hamp RAMAN, PA  sildenafil (VIAGRA)  100 MG tablet Take 100 mg by mouth as needed for erectile dysfunction. 09/10/23   [provider]  tamsulosin  (FLOMAX ) 0.4 MG CAPS capsule Take 1 capsule (0.4 mg total) by mouth daily after supper. 05/01/24   Patrcia Cough, MD  dicyclomine  (BENTYL ) 20 MG tablet Take 1 tablet (20 mg total) by mouth 4 (four) times daily -  before meals and at bedtime. As needed for cramps and diarrhea 04/04/18 02/25/20  Avram Lupita BRAVO, MD  triazolam  (HALCION ) 0.25 MG tablet Take 1 tab by mouth 1 hour prior to procedure with very light food, do not drive motor vehicle. 06/23/18 02/25/20  Eldonna Novel, MD    Allergies: Patient has no known allergies.    Review of Systems  Constitutional:  Negative for fever.  HENT:  Negative for sore throat.   Respiratory:  Positive for cough and shortness of breath.   Cardiovascular:  Positive for chest pain. Negative for leg swelling.    Updated Vital Signs BP (!) 151/73 (BP Location: Left Arm)   Pulse 91   Temp 98.1 F (36.7 C) (Oral)   Resp (!) 22   Ht 5' 7 (1.702 m)   Wt 78.9 kg   SpO2 100%   BMI 27.25 kg/m   Physical Exam Vitals and nursing note reviewed.  Constitutional:      General: He is not in acute distress.    Appearance: He is well-developed. He is not ill-appearing or diaphoretic.  HENT:     Head: Normocephalic and atraumatic.  Cardiovascular:     Rate and Rhythm: Normal rate and regular rhythm.     Heart sounds: Normal heart sounds.  Pulmonary:     Effort: Pulmonary effort is normal. No accessory muscle usage or respiratory distress.     Breath sounds: Wheezing (slight) present.  Abdominal:     Palpations: Abdomen is soft.     Tenderness: There is no abdominal tenderness.  Musculoskeletal:     Right lower leg: No edema.     Left lower leg: No edema.  Skin:    General: Skin is warm and dry.  Neurological:     Mental Status: He is alert.     (all labs ordered are listed, but only abnormal results are displayed) Labs Reviewed   CBC WITH DIFFERENTIAL/PLATELET - Abnormal; Notable for the following components:      Result Value   WBC 11.5 (*)    HCT 38.4 (*)    MCHC 37.2 (*)    Monocytes Absolute 1.5 (*)    Abs Immature Granulocytes 0.20 (*)    All other components within normal limits  BASIC METABOLIC PANEL WITH GFR - Abnormal; Notable for the following components:   Potassium 3.4 (*)    Glucose,  Bld 123 (*)    Creatinine, Ser 1.33 (*)    GFR, Estimated 59 (*)    All other components within normal limits  RESP PANEL BY RT-PCR (RSV, FLU A&B, COVID)  [id number]  PHOSPHORUS  TROPONIN T, HIGH SENSITIVITY  TROPONIN T, HIGH SENSITIVITY    EKG: EKG Interpretation Date/Time:  Monday October 02 2024 06:17:26 EST Ventricular Rate:  102 PR Interval:  168 QRS Duration:  95 QT Interval:  349 QTC Calculation: 446 R Axis:   31  Text Interpretation: Sinus tachycardia Multiple ventricular premature complexes Confirmed by Palumbo, April ([id number]) on 10/02/2024 6:20:17 AM  Radiology: DG Chest 2 View Result Date: 10/02/2024 EXAM: 2 VIEW(S) XRAY OF THE CHEST 10/02/2024 08:41:00 AM COMPARISON: 07/30/2024 CLINICAL HISTORY: cough FINDINGS: LUNGS AND PLEURA: No focal pulmonary opacity. No pleural effusion. No pneumothorax. HEART AND MEDIASTINUM: Calcified aorta. BONES AND SOFT TISSUES: Cervical fusion hardware noted. Thoracic degenerative changes. IMPRESSION: 1. No acute findings. Electronically signed by: Norleen Boxer MD MD 10/02/2024 09:48 AM EST RP Workstation: [id number]     .Critical Care  Performed by: Freddi Hamilton, MD Authorized by: Freddi Hamilton, MD   Critical care provider statement:    Critical care time (minutes):  30   Critical care time was exclusive of:  Separately billable procedures and treating other patients   Critical care was necessary to treat or prevent imminent or life-threatening deterioration of the following conditions:  Respiratory failure   Critical care was time spent personally by me on the  following activities:  Development of treatment plan with patient or surrogate, discussions with consultants, evaluation of patient's response to treatment, examination of patient, ordering and review of laboratory studies, ordering and review of radiographic studies, ordering and performing treatments and interventions, pulse oximetry, re-evaluation of patient's condition and review of old charts    Medications Ordered in the ED  magnesium  sulfate IVPB 2 g 50 mL (has no administration in time range)  potassium chloride  SA (KLOR-CON  M) CR tablet 40 mEq (has no administration in time range)  enoxaparin  (LOVENOX ) injection 40 mg (has no administration in time range)  acetaminophen  (TYLENOL ) tablet 650 mg (has no administration in time range)    Or  acetaminophen  (TYLENOL ) suppository 650 mg (has no administration in time range)  ondansetron  (ZOFRAN ) tablet 4 mg (has no administration in time range)    Or  ondansetron  (ZOFRAN ) injection 4 mg (has no administration in time range)  predniSONE  (DELTASONE ) tablet 40 mg (has no administration in time range)  ipratropium-albuterol  (DUONEB) 0.5-2.5 (3) MG/3ML nebulizer solution 3 mL (3 mLs Nebulization Given 10/02/24 0756)  sodium chloride  0.9 % bolus 500 mL (0 mLs Intravenous Stopped 10/02/24 0900)  potassium chloride  SA (KLOR-CON  M) CR tablet 40 mEq (40 mEq Oral Given 10/02/24 0756)  ipratropium-albuterol  (DUONEB) 0.5-2.5 (3) MG/3ML nebulizer solution 3 mL (3 mLs Nebulization Given 10/02/24 1009)  albuterol  (PROVENTIL ) (2.5 MG/3ML) 0.083% nebulizer solution 2.5 mg (2.5 mg Nebulization Given 10/02/24 1009)  methylPREDNISolone  sodium succinate (SOLU-MEDROL ) 125 mg/2 mL injection 125 mg (125 mg Intravenous Given 10/02/24 1009)  acetaminophen  (TYLENOL ) tablet 650 mg (650 mg Oral Given 10/02/24 1128)  albuterol  (PROVENTIL ) (2.5 MG/3ML) 0.083% nebulizer solution (10 mg/hr Nebulization Given 10/02/24 1137)                                    Medical Decision  Making Amount and/or Complexity of Data Reviewed External Data Reviewed: notes. Labs:  ordered.    Details: Troponins negative x 2 Radiology: ordered and independent interpretation performed.    Details: No pneumonia ECG/medicine tests: ordered and independent interpretation performed.    Details: No ischemia  Risk OTC drugs. Prescription drug management. Decision regarding hospitalization.  Patient is not in respiratory distress. After the initial breathing treatment, patient has opened up and is a lot more wheezy.  Still symptomatic so another breathing treatment and Solu-Medrol  was given.  He is still wheezy and short of breath after this second round of albuterol  with Atrovent .  While he is not distressed, his sats are in the low 90s and I think he will need admission for further respiratory support.  Was put on continuous albuterol .  Discussed with Dr. Celinda for admission.  Given the prominent wheezing and cough, my suspicion for PE, ACS, etc. is low.     Final diagnoses:  COPD exacerbation [facility])    ED Discharge Orders     None          Freddi Hamilton, MD 10/02/24 1224  "

## 2024-10-02 NOTE — H&P (Signed)
 " History and Physical    Patient: Joshua Stephenson FMW:[id number] DOB: Aug 29, 1956 DOA: 10/02/2024 DOS: the patient was seen and examined on 10/02/2024 PCP: Valentin Skates, DO  Patient coming from: Home  Chief Complaint:  Chief Complaint  Patient presents with   Shortness of Breath   HPI: Joshua Stephenson is a 69 y.o. male with medical history significant of anxiety, depression, childhood asthma, COPD, BPH, elevated PSA,  urinary urgency, nocturia, coronary artery calcification, ED, GERD, hemorrhoids, adenomatous colon polyps, hyperlipidemia, hypertension,sleep apnea who presented to the emergency department complaints of progressively worsening dyspnea   He denied fever, chills, rhinorrhea, sore throat, wheezing or hemoptysis.  Positive pleuritic chest pain, but no palpitations, diaphoresis, PND, orthopnea or pitting edema of the lower extremities.  No abdominal pain, nausea, emesis, diarrhea, constipation, melena or hematochezia.  No flank pain, dysuria, frequency or hematuria.  No polyuria, polydipsia, polyphagia or blurred vision.   Lab work: CBC showed a white count of 11.5, hemoglobin 14.3 g/dL and platelets 742.  Troponin x 2 was 18 ng/L.  Negative coronavirus, influenza and RSV PCR.  BMP showed potassium of 3.4 mmol/L with the rest of the electrolytes being normal.  Glucose 123, BUN 13 and creatinine 1.33 mg/dL.  Baseline creatinine has ranged from 1.10-1.13 mg/dL in the last 6 months.  Imaging: 2 view chest radiograph with no acute findings.  ED course: Initial vital signs were temperature 98.1 F, pulse 113, respirations 21, BP 171/90 mmHg and O2 sat 84% on room air.  The patient received 2 DuoNebs, an albuterol  2.5 mg neb, a continuous albuterol  10 mg neb, methylprednisolone  125 mg IVP, KCl 40 mill equivalents p.o. x 1 and normal saline 500 mL IV bolus.   Review of Systems: As mentioned in the history of present illness. All other systems reviewed and are negative. Past  Medical History:  Diagnosis Date   Anxiety    Asthma    as a child   Atherosclerosis of aorta    BPH with elevated PSA and lower urinary tract symptoms    COPD, mild (HCC) 06/07/2014   Coronary artery calcification    Depression    Dyspnea    ED (erectile dysfunction)    Elevated PSA    GERD (gastroesophageal reflux disease)    Hemorrhoids    Hx of adenomatous colonic polyps 07/08/2021   2 diminutive - repeat exam 2029   Hypercholesterolemia    Hypertension    Nocturia    Sleep apnea    in process of obtaining cpap   Urinary urgency   Urinary Past Surgical History:  Procedure Laterality Date   ANTERIOR CERVICAL DECOMP/DISCECTOMY FUSION N/A 08/07/2021   Procedure: Anterior Cervical Decompression/discectomy Fusion Cervical four five, Cervical five six, Cervical six seven;  Surgeon: Lanis Pupa, MD;  Location: [facility] OR;  Service: Neurosurgery;  Laterality: N/A;  3C   COLONOSCOPY  2011   GOLD SEED IMPLANT N/A 03/17/2024   Procedure: INSERTION, GOLD SEEDS;  Surgeon: Lovie Arlyss CROME, MD;  Location: [facility] OR;  Service: Urology;  Laterality: N/A;  GOLD SEED IMPLANT AND SPACEOAR   PROSTATE BIOPSY     SHOULDER ARTHROSCOPY W/ ROTATOR CUFF REPAIR Bilateral 02/08/2018   SPACE OAR INSTILLATION N/A 03/17/2024   Procedure: INJECTION, HYDROGEL SPACER;  Surgeon: Lovie Arlyss CROME, MD;  Location: [facility] OR;  Service: Urology;  Laterality: N/A;   Social History:  reports that he quit smoking about 24 years ago. His smoking use included cigarettes. He started smoking about 52 years ago.  He has a 28 pack-year smoking history. He has been exposed to tobacco smoke. He has never used smokeless tobacco. He reports that he does not currently use alcohol . He reports that he does not use drugs.  Allergies[1]  Family History  Problem Relation Age of Onset   Breast cancer Mother    Hypertension Mother    Diabetes Father    Heart disease Father    Hypertension Father    Cancer Sister        type unknown    Breast cancer Sister    Cancer Brother        oral cancer    Cancer Brother        oral cancer    Breast cancer Niece    Breast cancer Niece    Colon cancer Neg Hx    Esophageal cancer Neg Hx    Rectal cancer Neg Hx    Stomach cancer Neg Hx    Colon polyps Neg Hx     Prior to Admission medications  Medication Sig Start Date End Date Taking? Authorizing Provider  albuterol  (PROVENTIL ) (2.5 MG/3ML) 0.083% nebulizer solution Take 3 mLs (2.5 mg total) by nebulization every 6 (six) hours as needed for wheezing or shortness of breath. 06/30/24   Jude Harden GAILS, MD  albuterol  (VENTOLIN  HFA) 108 [phone] Base) MCG/ACT inhaler Inhale 1 -2 puffs every 4-6 hours as needed- rescue. 04/13/24   Horton, Roxie HERO, DO  aspirin  EC 81 MG tablet Take 1 tablet (81 mg total) by mouth daily. Swallow whole. 06/10/22   Tolia, Sunit, DO  atorvastatin  (LIPITOR) 20 MG tablet TAKE 1 TABLET BY MOUTH EVERYDAY AT BEDTIME 07/26/23   Tolia, Sunit, DO  clonazePAM  (KLONOPIN ) 0.5 MG tablet Take 1 tablet (0.5 mg total) by mouth at bedtime. 03/31/23   Hope Almarie ORN, NP  dextromethorphan-guaiFENesin  (MUCINEX  DM) 30-600 MG 12hr tablet Take 1 tablet by mouth 2 (two) times daily as needed for cough. 09/26/24   Charley Conger, PA-C  doxycycline  (VIBRAMYCIN ) 100 MG capsule Take 1 capsule (100 mg total) by mouth 2 (two) times daily. 06/28/24   Horton, Charmaine FALCON, MD  escitalopram  (LEXAPRO ) 10 MG tablet Take 10 mg by mouth daily. 01/19/23   [provider]  fluconazole  (DIFLUCAN ) 200 MG tablet Take 1 tablet (200 mg total) by mouth daily. 04/06/24   Patrcia Cough, MD  Fluticasone-Umeclidin-Vilant (TRELEGY ELLIPTA ) 200-62.5-25 MCG/ACT AEPB Inhale 1 puff then rinse mouth, once daily 09/26/24   Gillis, Joan, PA-C  gabapentin  (NEURONTIN ) 300 MG capsule Take 300 mg by mouth 2 (two) times daily.    [provider]  guaiFENesin  (MUCINEX ) 600 MG 12 hr tablet Take 1 tablet (600 mg total) by mouth 2 (two) times daily. 04/13/24    Horton, Roxie HERO, DO  levofloxacin  (LEVAQUIN ) 500 MG tablet Take 1 tablet (500 mg total) by mouth daily. 09/26/24   Charley Conger, PA-C  methocarbamol  (ROBAXIN ) 750 MG tablet Take 750 mg by mouth 4 (four) times daily. 12/09/22   [provider]  metoprolol  succinate (TOPROL  XL) 50 MG 24 hr tablet Take 1 tablet (50 mg total) by mouth daily. Take with or immediately following a meal. 07/26/23   Tolia, Sunit, DO  nabumetone (RELAFEN) 750 MG tablet Take 750 mg by mouth 2 (two) times daily. 03/29/23   [provider]  pantoprazole  (PROTONIX ) 40 MG tablet Take 40 mg by mouth 2 (two) times daily.    [provider]  predniSONE  (DELTASONE ) 10 MG tablet Take 4 tablets (  40 mg total) by mouth daily for 2 days, THEN 3 tablets (30 mg total) daily for 2 days, THEN 2 tablets (20 mg total) daily for 2 days, THEN 1 tablet (10 mg total) daily for 2 days. 09/26/24 10/04/24  Charley Conger, PA-C  predniSONE  (STERAPRED UNI-PAK 21 TAB) 10 MG (21) TBPK tablet Take by mouth daily. Take 6 tabs by mouth daily  for 2 days, then 5 tabs for 2 days, then 4 tabs for 2 days, then 3 tabs for 2 days, 2 tabs for 2 days, then 1 tab by mouth daily for 2 days 07/30/24   Neldon Hamp RAMAN, PA  promethazine -dextromethorphan (PROMETHAZINE -DM) 6.25-15 MG/5ML syrup Take 5 mLs by mouth 4 (four) times daily as needed for cough. 07/30/24   Neldon Hamp RAMAN, PA  sildenafil (VIAGRA) 100 MG tablet Take 100 mg by mouth as needed for erectile dysfunction. 09/10/23   [provider]  tamsulosin  (FLOMAX ) 0.4 MG CAPS capsule Take 1 capsule (0.4 mg total) by mouth daily after supper. 05/01/24   Patrcia Cough, MD  dicyclomine  (BENTYL ) 20 MG tablet Take 1 tablet (20 mg total) by mouth 4 (four) times daily -  before meals and at bedtime. As needed for cramps and diarrhea 04/04/18 02/25/20  Avram Lupita BRAVO, MD  triazolam  (HALCION ) 0.25 MG tablet Take 1 tab by mouth 1 hour prior to procedure with very light food, do not drive motor vehicle.  06/23/18 02/25/20  Eldonna Novel, MD    Physical Exam: Vitals:   10/02/24 0613 10/02/24 0704 10/02/24 0845 10/02/24 1000  BP: (!) 171/90  138/78 (!) 151/73  Pulse: (!) 113 87  85  Resp: (!) 21  (!) 24 (!) 23  Temp: 98.1 F (36.7 C)   98.1 F (36.7 C)  TempSrc: Oral   Oral  SpO2: (!) 88% 95%  94%  Weight: 78.9 kg     Height: 5' 7 (1.702 m)      Physical Exam Vitals and nursing note reviewed.  Constitutional:      General: He is awake. He is not in acute distress.    Appearance: He is ill-appearing.  HENT:     Head: Normocephalic.     Nose: No rhinorrhea.     Mouth/Throat:     Mouth: Mucous membranes are moist.  Eyes:     General: No scleral icterus.    Pupils: Pupils are equal, round, and reactive to light.  Neck:     Vascular: No JVD.  Cardiovascular:     Rate and Rhythm: Normal rate and regular rhythm.     Heart sounds: S1 normal and S2 normal.  Pulmonary:     Effort: No accessory muscle usage or respiratory distress.     Breath sounds: Decreased breath sounds, wheezing and rhonchi present. No rales.  Abdominal:     General: Bowel sounds are normal. There is no distension.     Palpations: Abdomen is soft.     Tenderness: There is no abdominal tenderness. There is no right CVA tenderness or left CVA tenderness.  Musculoskeletal:     Cervical back: Neck supple.     Right lower leg: No edema.     Left lower leg: No edema.  Skin:    General: Skin is warm and dry.  Neurological:     General: No focal deficit present.     Mental Status: He is alert and oriented to person, place, and time.  Psychiatric:        Mood and Affect:  Mood normal.        Behavior: Behavior normal. Behavior is cooperative.     Data Reviewed:  Results are pending, will review when available.  EKG: Vent. rate 102 BPM  PR interval 168 ms  QRS duration 95 ms  QT/QTcB 349/446 ms  P-R-T axes 61 31 99  Sinus tachycardia  Multiple ventricular premature complexes  Assessment and  Plan: Principal Problem:   Acute respiratory failure with hypoxia (HCC) In the setting of:   COPD with acute exacerbation (HCC) Observation/telemetry Continue supplemental oxygen. Methylprednisolone  125 mg IVP x1. Followed by prednisone  40 mg p.o. daily in a.m. Scheduled and as needed bronchodilators. Follow-up CBC and chemistry in the morning.   Active Problems:   GERD (gastroesophageal reflux disease) Continue pantoprazole  40 mg p.o. twice daily.    OSA (obstructive sleep apnea) Continue CPAP at bedtime.    Malignant neoplasm of prostate (HCC) Continue tamsulosin  0.4 mg p.o. daily. Follow-up with urology as scheduled.    Hypertension Monitor blood pressure. Needed antihypertensives.    Persistent depressive disorder Continue escitalopram  10 mg p.o. daily.    Prediabetes Follow fasting glucose. Check hemoglobin A1c.    Advance Care Planning:   Code Status: Full Code   Consults:   Family Communication:   Severity of Illness: The appropriate patient status for this patient is OBSERVATION. Observation status is judged to be reasonable and necessary in order to provide the required intensity of service to ensure the patient's safety. The patient's presenting symptoms, physical exam findings, and initial radiographic and laboratory data in the context of their medical condition is felt to place them at decreased risk for further clinical deterioration. Furthermore, it is anticipated that the patient will be medically stable for discharge from the hospital within 2 midnights of admission.   Author: Alm Dorn Castor, MD 10/02/2024 11:04 AM  For on call review [email or web address].   This document was prepared using Dragon voice recognition software and may contain some unintended transcription errors.     [1] No Known Allergies  "

## 2024-10-02 NOTE — ED Triage Notes (Signed)
 Pt. Arrives pov for sob x1 week that has gotten worse. Denies chest pain. Hx of COPD. Pt. Has a productive cough. Reports coughing up clear mucus. Also reports occasional headaches. Denies NVD. States that he tested negative last week for covid and flu.

## 2024-10-03 DIAGNOSIS — J441 Chronic obstructive pulmonary disease with (acute) exacerbation: Secondary | ICD-10-CM | POA: Diagnosis not present

## 2024-10-03 LAB — COMPREHENSIVE METABOLIC PANEL WITH GFR
ALT: 15 U/L (ref 0–44)
AST: 20 U/L (ref 15–41)
Albumin: 3.7 g/dL (ref 3.5–5.0)
Alkaline Phosphatase: 87 U/L (ref 38–126)
Anion gap: 9 (ref 5–15)
BUN: 22 mg/dL (ref 8–23)
CO2: 27 mmol/L (ref 22–32)
Calcium: 9.5 mg/dL (ref 8.9–10.3)
Chloride: 100 mmol/L (ref 98–111)
Creatinine, Ser: 1.09 mg/dL (ref 0.61–1.24)
GFR, Estimated: >60 mL/min
Glucose, Bld: 113 mg/dL — ABNORMAL HIGH (ref 70–99)
Potassium: 4.6 mmol/L (ref 3.5–5.1)
Sodium: 136 mmol/L (ref 135–145)
Total Bilirubin: 0.4 mg/dL (ref 0.0–1.2)
Total Protein: 6.8 g/dL (ref 6.5–8.1)

## 2024-10-03 LAB — CBC
HCT: 37.2 % — ABNORMAL LOW (ref 39.0–52.0)
Hemoglobin: 13.7 g/dL (ref 13.0–17.0)
MCH: 31.8 pg (ref 26.0–34.0)
MCHC: 36.8 g/dL — ABNORMAL HIGH (ref 30.0–36.0)
MCV: 86.3 fL (ref 80.0–100.0)
Platelets: 256 K/uL (ref 150–400)
RBC: 4.31 MIL/uL (ref 4.22–5.81)
RDW: 13.9 % (ref 11.5–15.5)
WBC: 12.6 K/uL — ABNORMAL HIGH (ref 4.0–10.5)
nRBC: 0 % (ref 0.0–0.2)

## 2024-10-03 LAB — HIV ANTIBODY (ROUTINE TESTING W REFLEX): HIV Screen 4th Generation wRfx: NONREACTIVE

## 2024-10-03 MED ORDER — PREDNISONE 20 MG PO TABS: 40.0000 mg | ORAL_TABLET | Freq: Every day | ORAL | Status: AC

## 2024-10-03 MED ORDER — ALBUTEROL SULFATE (2.5 MG/3ML) 0.083% IN NEBU
2.5000 mg | INHALATION_SOLUTION | Freq: Four times a day (QID) | RESPIRATORY_TRACT | Status: DC
  Administered 2024-10-03 (×2): 2.5 mg via RESPIRATORY_TRACT
  Filled 2024-10-03 (×2): qty 3

## 2024-10-03 NOTE — Progress Notes (Signed)
" °   10/03/24 1033  TOC Brief Assessment  Insurance and Status Reviewed  Patient has primary care physician Yes  Home environment has been reviewed Apartment  Prior level of function: Independent  Prior/Current Home Services No current home services  Social Drivers of Health Review SDOH reviewed no interventions necessary  Readmission risk has been reviewed Yes  Transition of care needs transition of care needs identified, TOC will continue to follow    Signed: Heather Saltness, MSW, LCSW Clinical Social Worker Inpatient Care Management 10/03/2024 10:34 AM   "

## 2024-10-03 NOTE — Plan of Care (Signed)
" °  Problem: Respiratory: Goal: Levels of oxygenation will improve Outcome: Progressing   Problem: Respiratory: Goal: Ability to maintain adequate ventilation will improve Outcome: Progressing   Collaborate with respiratory therapy Use aspiration precautions Support non-invasive mechanical ventilation Monitor diagnostic test results   "

## 2024-10-03 NOTE — Progress Notes (Signed)
" °   10/03/24 0023  BiPAP/CPAP/SIPAP  $ Non-Invasive Home Ventilator  Initial  BiPAP/CPAP/SIPAP Pt Type Adult  BiPAP/CPAP/SIPAP DREAMSTATIOND  Mask Type Full face mask  Dentures removed? Not applicable  Mask Size Medium  Respiratory Rate 18 breaths/min  Patient Home Machine No  Patient Home Mask No  Patient Home Tubing No  Auto Titrate Yes  Minimum cmH2O 5 cmH2O  Maximum cmH2O 20 cmH2O  Device Plugged into [employer] Power Outlet Yes    "

## 2024-10-03 NOTE — Progress Notes (Signed)
SATURATION QUALIFICATIONS: (This note is used to comply with regulatory documentation for home oxygen)  Patient Saturations on Room Air at Rest = 96%  Patient Saturations on Room Air while Ambulating = 93%  Patient Saturations on 2 Liters of oxygen while Ambulating = 98%  Please briefly explain why patient needs home oxygen:

## 2024-10-03 NOTE — Discharge Summary (Signed)
 "  Physician Discharge Summary  Joshua Stephenson FMW:[id number] DOB: 1956/04/27 DOA: 10/02/2024  PCP: Valentin Skates, DO  Admit date: 10/02/2024 Discharge date: 10/03/2024  Admitted from: Home Discharge disposition: Home  Recommendations at discharge:  Complete the course of steroids to prednisone     Subjective: Patient was seen and examined this morning. Propped up in bed.  Coughing.  Not wheezing but mild diffuse bronchial sounds.  Requiring 2 L oxygen at rest.  Family at bedside  Afebrile, hemodynamically stable,  Brief narrative: Joshua Stephenson is a 69 y.o. male with PMH significant for HTN, HLD, OSA, CAD, GERD, COPD, BPH, anxiety, depression, adenomatous colon polyps. 1/12, patient presented to the ED with complaint of progressive dyspnea, pleuritic chest pain.  In the ED, patient was afebrile, hemodynamically stable, initial O2 sat was 88% on room air and required supplemental oxygen. Labs showed WC count 11.5, BUN/creatinine 13/1.33, potassium 3.4 Resp virus panel unremarkable. Chest x-ray unremarkable.  Patient was given IV steroids, IV fluid, breathing treatment, Admitted to [facility] for COPD exacerbation  Hospital course: Acute respiratory failure with hypoxia COPD with acute exacerbation Presented with progressive dyspnea, pleuritic chest pain Given IV Solu-Medrol  125 mg in the ED. Has been started on prednisone  and bronchodilators  initially required on 2 L oxygen by nasal cannula But able to ambulate on the hallway this afternoon without supplemental oxygen. Patient wants to go home.  Discharge home to complete the course of prednisone .  Continue bronchodilators, antitussives. Recent Labs  Lab 10/02/24 0622 10/03/24 0601  WBC 11.5* 12.6*   Hypertension Blood pressure currently normal on Toprol  50 mg daily  CAD, HLD Continue aspirin  81 mg daily and Lipitor 20 mg daily  GERD  Continue PPI    OSA  Continue CPAP at bedtime.   BPH Continue  Flomax .   Follow-up with urology as scheduled.   Depression Continue escitalopram  10 mg p.o. daily.   Nutrition Status:        Goals of care   Code Status: Full Code    Diet:  Diet Order             Diet general           Diet Heart Room service appropriate? Yes; Fluid consistency: Thin  Diet effective now                   Nutritional status:  Body mass index is 27.25 kg/m.       Wounds:  -    Discharge Medications:   Allergies as of 10/03/2024   No Known Allergies      Medication List     TAKE these medications    albuterol  108 (90 Base) MCG/ACT inhaler Commonly known as: VENTOLIN  HFA Inhale 1 -2 puffs every 4-6 hours as needed- rescue. What changed:  how much to take how to take this when to take this reasons to take this   albuterol  (2.5 MG/3ML) 0.083% nebulizer solution Commonly known as: PROVENTIL  Take 3 mLs (2.5 mg total) by nebulization every 6 (six) hours as needed for wheezing or shortness of breath. What changed: Another medication with the same name was changed. Make sure you understand how and when to take each.   aspirin  EC 81 MG tablet Take 1 tablet (81 mg total) by mouth daily. Swallow whole.   atorvastatin  20 MG tablet Commonly known as: LIPITOR TAKE 1 TABLET BY MOUTH EVERYDAY AT BEDTIME What changed:  how much to take how to take this when to  take this   dextromethorphan-guaiFENesin  30-600 MG 12hr tablet Commonly known as: MUCINEX  DM Take 1 tablet by mouth 2 (two) times daily as needed for cough.   escitalopram  10 MG tablet Commonly known as: LEXAPRO  Take 10 mg by mouth daily.   guaiFENesin  600 MG 12 hr tablet Commonly known as: Mucinex  Take 1 tablet (600 mg total) by mouth 2 (two) times daily.   levofloxacin  500 MG tablet Commonly known as: LEVAQUIN  Take 1 tablet (500 mg total) by mouth daily.   metoprolol  succinate 50 MG 24 hr tablet Commonly known as: Toprol  XL Take 1 tablet (50 mg total) by mouth  daily. Take with or immediately following a meal.   pantoprazole  40 MG tablet Commonly known as: PROTONIX  Take 40 mg by mouth 2 (two) times daily.   predniSONE  20 MG tablet Commonly known as: DELTASONE  Take 2 tablets (40 mg total) by mouth daily with breakfast for 5 days. Start taking on: October 04, 2024 What changed:  medication strength See the new instructions.   sildenafil 100 MG tablet Commonly known as: VIAGRA Take 100 mg by mouth as needed for erectile dysfunction.   tamsulosin  0.4 MG Caps capsule Commonly known as: Flomax  Take 1 capsule (0.4 mg total) by mouth daily after supper.   Trelegy Ellipta  200-62.5-25 MCG/ACT Aepb Generic drug: Fluticasone-Umeclidin-Vilant Inhale 1 puff then rinse mouth, once daily         Follow ups:    Follow-up Information     Valentin Skates, DO Follow up.   Specialty: Internal Medicine Contact information: [address] [place] [address] [phone]                 Discharge Instructions:   Discharge Instructions     Call MD for:  difficulty breathing, headache or visual disturbances   Complete by: As directed    Call MD for:  extreme fatigue   Complete by: As directed    Call MD for:  hives   Complete by: As directed    Call MD for:  persistant dizziness or light-headedness   Complete by: As directed    Call MD for:  persistant nausea and vomiting   Complete by: As directed    Call MD for:  severe uncontrolled pain   Complete by: As directed    Call MD for:  temperature >100.4   Complete by: As directed    Diet general   Complete by: As directed    Discharge instructions   Complete by: As directed    Complete the course of steroids to prednisone   General discharge instructions: Follow with Primary MD Valentin Skates, DO in 7 days  Please request your PCP  to go over your hospital tests, procedures, radiology results at the follow up. Please get your medicines reviewed and adjusted.  Your PCP may  decide to repeat certain labs or tests as needed. Do not drive, operate heavy machinery, perform activities at heights, swimming or participation in water activities or provide baby sitting services if your were admitted for syncope or siezures until you have seen by Primary MD or a Neurologist and advised to do so again. [place]  Controlled Substance Reporting System database was reviewed. Do not drive, operate heavy machinery, perform activities at heights, swim, participate in water activities or provide baby-sitting services while on medications for pain, sleep and mood until your outpatient physician has reevaluated you and advised to do so again.  You are strongly recommended to comply with the dose, frequency  and duration of prescribed medications. Activity: As tolerated with Full fall precautions use walker/cane & assistance as needed Avoid using any recreational substances like cigarette, tobacco, alcohol , or non-prescribed drug. If you experience worsening of your admission symptoms, develop shortness of breath, life threatening emergency, suicidal or homicidal thoughts you must seek medical attention immediately by calling 911 or calling your MD immediately  if symptoms less severe. You must read complete instructions/literature along with all the possible adverse reactions/side effects for all the medicines you take and that have been prescribed to you. Take any new medicine only after you have completely understood and accepted all the possible adverse reactions/side effects.  Wear Seat belts while driving. You were cared for by a hospitalist during your hospital stay. If you have any questions about your discharge medications or the care you received while you were in the hospital after you are discharged, you can call the unit and ask to speak with the hospitalist or the covering physician. Once you are discharged, your primary care physician will handle any further medical issues.  Please note that NO REFILLS for any discharge medications will be authorized once you are discharged, as it is imperative that you return to your primary care physician (or establish a relationship with a primary care physician if you do not have one).   Increase activity slowly   Complete by: As directed        Discharge Exam:   Vitals:   10/03/24 0801 10/03/24 0802 10/03/24 1157 10/03/24 1254  BP:    123/78  Pulse:    74  Resp:    18  Temp:    98.4 F (36.9 C)  TempSrc:    Oral  SpO2: 94% 94% 96% 97%  Weight:      Height:        Body mass index is 27.25 kg/m.   General exam: Pleasant, elderly African-American male  Skin: No rashes, lesions or ulcers. HEENT: Atraumatic, normocephalic, no obvious bleeding Lungs: Clear to auscultation bilaterally, no crackles or wheezing.  Coughs on deep breathing. CVS: S1, S2, no murmur,   GI/Abd: Soft, nontender, nondistended, bowel sound present,   CNS: Alert, awake, oriented x 3 Psychiatry: Mood appropriate Extremities: No pedal edema, no calf tenderness,    The results of significant diagnostics from this hospitalization (including imaging, microbiology, ancillary and laboratory) are listed below for reference.    Procedures and Diagnostic Studies:   DG Chest 2 View Result Date: 10/02/2024 EXAM: 2 VIEW(S) XRAY OF THE CHEST 10/02/2024 08:41:00 AM COMPARISON: 07/30/2024 CLINICAL HISTORY: cough FINDINGS: LUNGS AND PLEURA: No focal pulmonary opacity. No pleural effusion. No pneumothorax. HEART AND MEDIASTINUM: Calcified aorta. BONES AND SOFT TISSUES: Cervical fusion hardware noted. Thoracic degenerative changes. IMPRESSION: 1. No acute findings. Electronically signed by: Norleen Boxer MD MD 10/02/2024 09:48 AM EST RP Workstation: [id number]     Labs:   Basic Metabolic Panel: Recent Labs  Lab 10/02/24 0622 10/02/24 1010 10/03/24 0601  NA 138  --  136  K 3.4*  --  4.6  CL 98  --  100  CO2 28  --  27  GLUCOSE 123*  --  113*  BUN  13  --  22  CREATININE 1.33*  --  1.09  CALCIUM  9.9  --  9.5  PHOS  --  3.8  --    GFR Estimated Creatinine Clearance: 62.3 mL/min (by C-G formula based on SCr of 1.09 mg/dL). Liver Function Tests: Recent Labs  Lab 10/03/24 0601  AST 20  ALT 15  ALKPHOS 87  BILITOT 0.4  PROT 6.8  ALBUMIN 3.7   No results for input(s): LIPASE, AMYLASE in the last 168 hours. No results for input(s): AMMONIA in the last 168 hours. Coagulation profile No results for input(s): INR, PROTIME in the last 168 hours.  CBC: Recent Labs  Lab 10/02/24 0622 10/03/24 0601  WBC 11.5* 12.6*  NEUTROABS 7.1  --   HGB 14.3 13.7  HCT 38.4* 37.2*  MCV 85.7 86.3  PLT 257 256   Cardiac Enzymes: No results for input(s): CKTOTAL, CKMB, CKMBINDEX, TROPONINI in the last 168 hours. BNP: Invalid input(s): POCBNP CBG: No results for input(s): GLUCAP in the last 168 hours. D-Dimer No results for input(s): DDIMER in the last 72 hours. Hgb A1c No results for input(s): HGBA1C in the last 72 hours. Lipid Profile No results for input(s): CHOL, HDL, LDLCALC, TRIG, CHOLHDL, LDLDIRECT in the last 72 hours. Thyroid  function studies No results for input(s): TSH, T4TOTAL, T3FREE, THYROIDAB in the last 72 hours.  Invalid input(s): FREET3 Anemia work up No results for input(s): VITAMINB12, FOLATE, FERRITIN, TIBC, IRON, RETICCTPCT in the last 72 hours. Microbiology Recent Results (from the past 240 hours)  Resp panel by RT-PCR (RSV, Flu A&B, Covid) Anterior Nasal Swab     Status: None   Collection Time: 10/02/24  6:22 AM   Specimen: Anterior Nasal Swab  Result Value Ref Range Status   SARS Coronavirus 2 by RT PCR NEGATIVE NEGATIVE Final    Comment: (NOTE) SARS-CoV-2 target nucleic acids are NOT DETECTED.  The SARS-CoV-2 RNA is generally detectable in upper respiratory specimens during the acute phase of infection. The lowest concentration of SARS-CoV-2  viral copies this assay can detect is 138 copies/mL. A negative result does not preclude SARS-Cov-2 infection and should not be used as the sole basis for treatment or other patient management decisions. A negative result may occur with  improper specimen collection/handling, submission of specimen other than nasopharyngeal swab, presence of viral mutation(s) within the areas targeted by this assay, and inadequate number of viral copies(<138 copies/mL). A negative result must be combined with clinical observations, patient history, and epidemiological information. The expected result is Negative.  Fact Sheet for Patients:  bloggercourse.com  Fact Sheet for Healthcare Providers:  seriousbroker.it  This test is no t yet approved or cleared by the [place]  FDA and  has been authorized for detection and/or diagnosis of SARS-CoV-2 by FDA under an Emergency Use Authorization (EUA). This EUA will remain  in effect (meaning this test can be used) for the duration of the COVID-19 declaration under Section 564(b)(1) of the Act, 21 U.S.C.section 360bbb-3(b)(1), unless the authorization is terminated  or revoked sooner.       Influenza A by PCR NEGATIVE NEGATIVE Final   Influenza B by PCR NEGATIVE NEGATIVE Final    Comment: (NOTE) The Xpert Xpress SARS-CoV-2/FLU/RSV plus assay is intended as an aid in the diagnosis of influenza from Nasopharyngeal swab specimens and should not be used as a sole basis for treatment. Nasal washings and aspirates are unacceptable for Xpert Xpress SARS-CoV-2/FLU/RSV testing.  Fact Sheet for Patients: bloggercourse.com  Fact Sheet for Healthcare Providers: seriousbroker.it  This test is not yet approved or cleared by the [place]  FDA and has been authorized for detection and/or diagnosis of SARS-CoV-2 by FDA under an Emergency Use Authorization  (EUA). This EUA will remain in effect (meaning this test can be used) for the duration of the COVID-19 declaration under  Section 564(b)(1) of the Act, 21 U.S.C. section 360bbb-3(b)(1), unless the authorization is terminated or revoked.     Resp Syncytial Virus by PCR NEGATIVE NEGATIVE Final    Comment: (NOTE) Fact Sheet for Patients: bloggercourse.com  Fact Sheet for Healthcare Providers: seriousbroker.it  This test is not yet approved or cleared by the [place]  FDA and has been authorized for detection and/or diagnosis of SARS-CoV-2 by FDA under an Emergency Use Authorization (EUA). This EUA will remain in effect (meaning this test can be used) for the duration of the COVID-19 declaration under Section 564(b)(1) of the Act, 21 U.S.C. section 360bbb-3(b)(1), unless the authorization is terminated or revoked.  Performed at [facility], [address], [place], [place] [address]     Time coordinating discharge: 45 minutes  Signed: Chapman Elder Stephenson  [facility] 10/03/2024, 5:44 PM   "

## 2024-10-03 NOTE — Care Management Obs Status (Signed)
 MEDICARE OBSERVATION STATUS NOTIFICATION   Patient Details  Name: Joshua Stephenson MRN: [id number] Date of Birth: [birth year]   Medicare Observation Status Notification Given:  Yes    Nickolette Espinola A Rendell Thivierge, LCSW 10/03/2024, 10:32 AM

## 2024-10-03 NOTE — Progress Notes (Signed)
 " PROGRESS NOTE  Joshua Stephenson  DOB: 26-May-1956  PCP: Valentin Skates, DO FMW:[id number]  DOA: 10/02/2024  LOS: 0 days  Hospital Day: 2  Subjective: Patient was seen and examined this morning. Propped up in bed.  Coughing.  Not wheezing but mild diffuse bronchial sounds.  Requiring 2 L oxygen at rest.  Family at bedside  Afebrile, hemodynamically stable, remains on 2 L oxygen.  Brief narrative: Joshua Stephenson is a 69 y.o. male with PMH significant for HTN, HLD, OSA, CAD, GERD, COPD, BPH, anxiety, depression, adenomatous colon polyps. 1/12, patient presented to the ED with complaint of progressive dyspnea, pleuritic chest pain.  In the ED, patient was afebrile, hemodynamically stable, initial O2 sat was 88% on room air and required supplemental oxygen. Labs showed WC count 11.5, BUN/creatinine 13/1.33, potassium 3.4 Resp virus panel unremarkable. Chest x-ray unremarkable.  Patient was given IV steroids, IV fluid, breathing treatment, Admitted to [facility] for COPD exacerbation  Assessment and plan: Acute respiratory failure with hypoxia COPD with acute exacerbation Presented with progressive dyspnea, pleuritic chest pain Given IV Solu-Medrol  125 mg in the ED. Has been started on prednisone  40 mg daily, bronchodilators On 2 L oxygen by nasal cannula. Recent Labs  Lab 10/02/24 0622 10/03/24 0601  WBC 11.5* 12.6*   Hypertension Blood pressure currently normal on Toprol  50 mg daily  CAD, HLD Continue aspirin  81 mg daily and Lipitor 20 mg daily  GERD  Continue PPI    OSA  Continue CPAP at bedtime.   BPH Continue Flomax .   Follow-up with urology as scheduled.   Depression Continue escitalopram  10 mg p.o. daily.   Nutrition Status:         Mobility: Encourage ambulation.  Check ambulatory oxygen requirement  PT Orders:   PT Follow up Rec:     Goals of care   Code Status: Full Code     DVT prophylaxis:  enoxaparin  (LOVENOX ) injection 40 mg  Start: 10/02/24 1200   Antimicrobials: None Fluid: None Consultants: None Family Communication: Family at bedside  Status: Observation Level of care:  Telemetry   Patient is from: Home Needs to continue in-hospital care: Needs to walk to check oxygen requirement Anticipated d/c to: Pending clinical course      Diet:  Diet Order             Diet Heart Room service appropriate? Yes; Fluid consistency: Thin  Diet effective now                   Scheduled Meds:  albuterol   2.5 mg Nebulization QID   arformoterol   15 mcg Nebulization BID   aspirin  EC  81 mg Oral Daily   atorvastatin   20 mg Oral QHS   budesonide  (PULMICORT ) nebulizer solution  0.25 mg Nebulization BID   enoxaparin  (LOVENOX ) injection  40 mg Subcutaneous Daily   escitalopram   10 mg Oral Daily   guaiFENesin   600 mg Oral BID   metoprolol  succinate  50 mg Oral Daily   pantoprazole   40 mg Oral BID   predniSONE   40 mg Oral Q breakfast   revefenacin   175 mcg Nebulization Daily   tamsulosin   0.4 mg Oral QPC supper    PRN meds: acetaminophen  **OR** acetaminophen , HYDROcodone  bit-homatropine, ondansetron  **OR** ondansetron  (ZOFRAN ) IV   Infusions:    Antimicrobials: Anti-infectives (From admission, onward)    None       Objective: Vitals:   10/03/24 1157 10/03/24 1254  BP:  123/78  Pulse:  74  Resp:  18  Temp:  98.4 F (36.9 C)  SpO2: 96% 97%    Intake/Output Summary (Last 24 hours) at 10/03/2024 1310 Last data filed at 10/03/2024 0900 Gross per 24 hour  Intake 279.06 ml  Output --  Net 279.06 ml   Filed Weights   10/02/24 0613  Weight: 78.9 kg   Weight change:  Body mass index is 27.25 kg/m.   Physical Exam: General exam: Pleasant, elderly African-American male  Skin: No rashes, lesions or ulcers. HEENT: Atraumatic, normocephalic, no obvious bleeding Lungs: Clear to auscultation bilaterally, no crackles or wheezing.  Coughs on deep breathing CVS: S1, S2, no murmur,   GI/Abd:  Soft, nontender, nondistended, bowel sound present,   CNS: Alert, awake, oriented x 3 Psychiatry: Mood appropriate Extremities: No pedal edema, no calf tenderness,   Data Review: I have personally reviewed the laboratory data and studies available.  F/u labs ordered Unresulted Labs (From admission, onward)    None       Signed, Chapman Rota, MD [facility] 10/03/2024  "

## 2024-10-19 ENCOUNTER — Ambulatory Visit (HOSPITAL_BASED_OUTPATIENT_CLINIC_OR_DEPARTMENT_OTHER): Admitting: Pulmonary Disease

## 2024-10-19 ENCOUNTER — Encounter (HOSPITAL_BASED_OUTPATIENT_CLINIC_OR_DEPARTMENT_OTHER): Payer: Self-pay | Admitting: Pulmonary Disease

## 2024-10-19 VITALS — BP 138/77 | HR 86 | Ht 67.0 in | Wt 183.2 lb

## 2024-10-19 DIAGNOSIS — J441 Chronic obstructive pulmonary disease with (acute) exacerbation: Secondary | ICD-10-CM

## 2024-10-19 DIAGNOSIS — F17211 Nicotine dependence, cigarettes, in remission: Secondary | ICD-10-CM

## 2024-10-19 DIAGNOSIS — R911 Solitary pulmonary nodule: Secondary | ICD-10-CM | POA: Diagnosis not present

## 2024-10-19 MED ORDER — ALBUTEROL SULFATE (2.5 MG/3ML) 0.083% IN NEBU: 2.5000 mg | INHALATION_SOLUTION | Freq: Four times a day (QID) | RESPIRATORY_TRACT | 3 refills | Status: AC | PRN

## 2024-10-19 MED ORDER — PREDNISONE 10 MG PO TABS: ORAL_TABLET | ORAL | 0 refills | Status: AC

## 2024-10-19 MED ORDER — ROFLUMILAST 500 MCG PO TABS: 250.0000 ug | ORAL_TABLET | Freq: Every day | ORAL | 1 refills | Status: AC

## 2024-10-19 NOTE — Progress Notes (Signed)
 "  Subjective:    Patient ID: Joshua Stephenson, male    DOB: [birth year], 69 y.o.   MRN: [id number]  [phone] Smoker (20pkyrs) followed for Lung Nodule, COPD  Mild OSA -not motivated to use CPAP He quit smoking in 2002 after starting in his mid-teens and was a heavy drinker in the past     PMH :  Prostate cancer, HTN, GERD, Cervical Radiculopathy, Depression, CAD,    12/27 >> pred + doxy Saw APP 09/26/24 >> levaquin  + pred taper CXR 1/12 clear  Discussed the use of AI scribe software for clinical note transcription with the patient, who gave verbal consent to proceed.  History of Present Illness Joshua Stephenson is a 69 year old male with COPD who presents for follow-up after a recent hospitalization for a COPD exacerbation.  He was hospitalized for one day on October 02, 2024, for hypoxemia with oxygen saturation of 88%. He received IV steroids and oxygen therapy and was discharged the next day.  He uses his nebulizer twice daily and his rescue inhaler several times a day, often taking three to four puffs instead of two. He takes Trelegy daily as prescribed. He had recently completed antibiotics and prednisone  before this hospitalization. He feels tired and has wheezing and dyspnea with short walks, worse at night but partially relieved by nebulizer and inhaler.  He quit smoking since his last visit. He recalls a COPD flare in October. He was previously hospitalized for high blood pressure. He reports prior radiation therapy for prostate cancer.       Significant tests/ events reviewed   CTA 03/2024 >> stable RUL nodule   HST 01/26/21- AHI 9.3/ hr, desaturation to 86%, body weight 187 lbs CPAP auto 5-15/ [employer]   ordered 06/27/21 PFT 04/30/21-minimal obstruction, insignif resp to BD  FEV1/FVC 0.75 PFT 01/03/24- FEV1 53% ,moderately severe COPD w/o resp to BD. Nl lung volumes and DLCO.   Review of Systems  neg for any significant sore throat, dysphagia, itching, sneezing, nasal  congestion or excess/ purulent secretions, fever, chills, sweats, unintended wt loss, pleuritic or exertional cp, hempoptysis, orthopnea pnd or change in chronic leg swelling. Also denies presyncope, palpitations, heartburn, abdominal pain, nausea, vomiting, diarrhea or change in bowel or urinary habits, dysuria,hematuria, rash, arthralgias, visual complaints, headache, numbness weakness or ataxia.      Objective:   Physical Exam  Gen. Pleasant, well-nourished, in no distress ENT - no thrush, no pallor/icterus,no post nasal drip Neck: No JVD, no thyromegaly, no carotid bruits Lungs: no use of accessory muscles, no dullness to percussion, decreased BL without rales or rhonchi  Cardiovascular: Rhythm regular, heart sounds  normal, no murmurs or gallops, no peripheral edema Musculoskeletal: No deformities, no cyanosis or clubbing         Assessment & Plan:   Assessment and Plan Assessment & Plan Chronic obstructive pulmonary disease with acute exacerbation Recent exacerbation likely due to bronchitis, with hospitalization for hypoxemia. Oxygen saturation improved with IV steroids and oxygen. Currently using albuterol  inhaler and nebulizer frequently, indicating unresolved symptoms. Trelegy is being used daily. No current need for antibiotics as chest x-ray was clear. Discussed potential for additional medications to reduce flare-ups, including a pill, nebulizer, or shot. Risks of steroids include weight gain, bone weakening, increased blood sugar, and eye effects. Discussed potential side effects of new medications, including diarrhea with the pill. Emphasized the importance of reducing flare-ups to prevent further lung damage. - Prescribed roflilumast to reduce flare-ups, starting with half a  tablet daily. - Prescribed prednisone  taper: 10 mg for a few days, then 20 mg for a few days, then 10 mg for a few days. - Refilled albuterol  for nebulizer. - Continue Trelegy twice daily. - Scheduled  follow-up in a month to assess medication tolerance and effectiveness. If not tolerated , options would be esfentrine or dupixent  Solitary pulmonary nodule Previously identified nodule on CT scan, stable last year. - Will schedule follow-up CT scan in July 2026.     "

## 2024-10-19 NOTE — Patient Instructions (Addendum)
" °  VISIT SUMMARY: You were seen today for a follow-up visit after your recent hospitalization for a COPD exacerbation. You have been experiencing tiredness, wheezing, and shortness of breath, especially at night. You have been using your nebulizer and rescue inhaler frequently. You have quit smoking since your last visit. We discussed your current treatment and potential new medications to help manage your symptoms and reduce flare-ups.  YOUR PLAN: -CHRONIC OBSTRUCTIVE PULMONARY DISEASE WITH ACUTE EXACERBATION: Chronic obstructive pulmonary disease (COPD) is a chronic inflammatory lung disease that causes obstructed airflow from the lungs. An acute exacerbation means a sudden worsening of symptoms. Your recent exacerbation was likely due to bronchitis, and you were hospitalized for low oxygen levels. We have prescribed ROFLILUMAST to reduce flare-ups, starting with half a tablet daily, and a prednisone  taper: 10 mg for a few days, then 20 mg for a few days, then 10 mg for a few days. Continue using Trelegy twice daily and your albuterol  nebulizer as needed. We will follow up in a month to assess how you are tolerating the new medication and its effectiveness.SIde effect of diarrhea was discussed. Use LOMOTIL if worse  -SOLITARY PULMONARY NODULE: A solitary pulmonary nodule is a small, round growth in the lung that is usually found incidentally on a CT scan. Your nodule was stable last year, and we will schedule a follow-up CT scan in July 2026 to monitor it.  INSTRUCTIONS: Please follow the prescribed medication regimen and continue using your nebulizer and Trelegy as directed. We will see you in a month to check on your progress. Additionally, we will schedule a follow-up CT scan in July 2026 to monitor your pulmonary nodule.    Contains text generated by [employer].   "

## 2024-11-13 ENCOUNTER — Inpatient Hospital Stay: Admitting: *Deleted
